# Patient Record
Sex: Male | Born: 1950 | Race: White | Hispanic: No | Smoking: Never smoker
Health system: Southern US, Community
[De-identification: ages and names within clinical notes are randomized; demographics above are authoritative.]

## PROBLEM LIST (undated history)

## (undated) DIAGNOSIS — K2970 Gastritis, unspecified, without bleeding: Secondary | ICD-10-CM

## (undated) DIAGNOSIS — G5790 Unspecified mononeuropathy of unspecified lower limb: Secondary | ICD-10-CM

## (undated) DIAGNOSIS — H409 Unspecified glaucoma: Secondary | ICD-10-CM

## (undated) DIAGNOSIS — G8929 Other chronic pain: Secondary | ICD-10-CM

## (undated) DIAGNOSIS — K259 Gastric ulcer, unspecified as acute or chronic, without hemorrhage or perforation: Secondary | ICD-10-CM

## (undated) DIAGNOSIS — M1990 Unspecified osteoarthritis, unspecified site: Secondary | ICD-10-CM

## (undated) DIAGNOSIS — K226 Gastro-esophageal laceration-hemorrhage syndrome: Secondary | ICD-10-CM

## (undated) DIAGNOSIS — M519 Unspecified thoracic, thoracolumbar and lumbosacral intervertebral disc disorder: Secondary | ICD-10-CM

## (undated) DIAGNOSIS — M549 Dorsalgia, unspecified: Secondary | ICD-10-CM

## (undated) DIAGNOSIS — R569 Unspecified convulsions: Secondary | ICD-10-CM

## (undated) DIAGNOSIS — K922 Gastrointestinal hemorrhage, unspecified: Secondary | ICD-10-CM

## (undated) DIAGNOSIS — F1010 Alcohol abuse, uncomplicated: Secondary | ICD-10-CM

## (undated) DIAGNOSIS — D649 Anemia, unspecified: Secondary | ICD-10-CM

## (undated) HISTORY — PX: TONSILLECTOMY: SUR1361

---

## 2005-12-26 DIAGNOSIS — G5790 Unspecified mononeuropathy of unspecified lower limb: Secondary | ICD-10-CM

## 2005-12-26 HISTORY — DX: Unspecified mononeuropathy of unspecified lower limb: G57.90

## 2009-08-26 ENCOUNTER — Ambulatory Visit: Payer: Self-pay | Admitting: Internal Medicine

## 2009-09-01 ENCOUNTER — Ambulatory Visit: Payer: Self-pay | Admitting: *Deleted

## 2010-02-19 ENCOUNTER — Ambulatory Visit: Payer: Self-pay | Admitting: Internal Medicine

## 2010-02-22 ENCOUNTER — Ambulatory Visit (HOSPITAL_COMMUNITY): Admission: RE | Admit: 2010-02-22 | Discharge: 2010-02-22 | Payer: Self-pay | Admitting: Internal Medicine

## 2010-02-24 ENCOUNTER — Ambulatory Visit: Payer: Self-pay | Admitting: Internal Medicine

## 2010-02-24 LAB — CONVERTED CEMR LAB
Albumin: 4.1 g/dL (ref 3.5–5.2)
Alkaline Phosphatase: 78 units/L (ref 39–117)
Basophils Absolute: 0 10*3/uL (ref 0.0–0.1)
CO2: 23 meq/L (ref 19–32)
Calcium: 9.4 mg/dL (ref 8.4–10.5)
Chloride: 105 meq/L (ref 96–112)
Cortisol, Plasma: 6.7 ug/dL
Creatinine, Ser: 0.64 mg/dL (ref 0.40–1.50)
DHEA-SO4: 34 ug/dL — ABNORMAL LOW (ref 80–560)
Eosinophils Absolute: 0.1 10*3/uL (ref 0.0–0.7)
Eosinophils Relative: 1 % (ref 0–5)
HCT: 41 % (ref 39.0–52.0)
Hgb A1c MFr Bld: 6.5 % — ABNORMAL HIGH (ref 4.6–6.1)
MCHC: 32.9 g/dL (ref 30.0–36.0)
MCV: 91.5 fL (ref 78.0–100.0)
Neutro Abs: 3.8 10*3/uL (ref 1.7–7.7)
Platelets: 163 10*3/uL (ref 150–400)
Potassium: 3.9 meq/L (ref 3.5–5.3)
RBC: 4.48 M/uL (ref 4.22–5.81)
Sodium: 139 meq/L (ref 135–145)
Total Bilirubin: 1.1 mg/dL (ref 0.3–1.2)
Vit D, 25-Hydroxy: 38 ng/mL (ref 30–89)

## 2010-02-26 ENCOUNTER — Ambulatory Visit (HOSPITAL_COMMUNITY): Admission: RE | Admit: 2010-02-26 | Discharge: 2010-02-26 | Payer: Self-pay | Admitting: Internal Medicine

## 2010-02-28 ENCOUNTER — Ambulatory Visit (HOSPITAL_COMMUNITY): Admission: RE | Admit: 2010-02-28 | Discharge: 2010-02-28 | Payer: Self-pay | Admitting: Internal Medicine

## 2010-03-10 ENCOUNTER — Ambulatory Visit: Payer: Self-pay | Admitting: Internal Medicine

## 2010-03-23 ENCOUNTER — Ambulatory Visit: Payer: Self-pay | Admitting: Internal Medicine

## 2010-03-23 LAB — CONVERTED CEMR LAB
Testosterone: 144.01 ng/dL — ABNORMAL LOW (ref 350–890)
Total CHOL/HDL Ratio: 4.4
VLDL: 19 mg/dL (ref 0–40)

## 2010-04-08 ENCOUNTER — Ambulatory Visit: Payer: Self-pay | Admitting: Internal Medicine

## 2010-04-29 ENCOUNTER — Ambulatory Visit: Payer: Self-pay | Admitting: Internal Medicine

## 2010-04-29 LAB — CONVERTED CEMR LAB: PSA: 0.06 ng/mL — ABNORMAL LOW (ref 0.10–4.00)

## 2010-05-07 ENCOUNTER — Ambulatory Visit: Payer: Self-pay | Admitting: Internal Medicine

## 2010-05-13 ENCOUNTER — Ambulatory Visit: Payer: Self-pay | Admitting: Internal Medicine

## 2010-05-13 LAB — CONVERTED CEMR LAB: Folate: 14.5 ng/mL

## 2010-05-31 ENCOUNTER — Ambulatory Visit: Payer: Self-pay | Admitting: Internal Medicine

## 2010-06-01 ENCOUNTER — Encounter (INDEPENDENT_AMBULATORY_CARE_PROVIDER_SITE_OTHER): Payer: Self-pay | Admitting: Internal Medicine

## 2010-06-14 ENCOUNTER — Ambulatory Visit: Payer: Self-pay | Admitting: Internal Medicine

## 2010-07-07 ENCOUNTER — Ambulatory Visit: Payer: Self-pay | Admitting: Internal Medicine

## 2010-07-07 LAB — CONVERTED CEMR LAB
BUN: 16 mg/dL (ref 6–23)
CO2: 19 meq/L (ref 19–32)
Chloride: 107 meq/L (ref 96–112)
Creatinine, Ser: 0.6 mg/dL (ref 0.40–1.50)
Magnesium: 2 mg/dL (ref 1.5–2.5)

## 2010-07-16 ENCOUNTER — Ambulatory Visit: Payer: Self-pay | Admitting: Internal Medicine

## 2010-08-05 ENCOUNTER — Ambulatory Visit: Payer: Self-pay | Admitting: Internal Medicine

## 2010-08-26 ENCOUNTER — Encounter: Admission: RE | Admit: 2010-08-26 | Discharge: 2010-10-27 | Payer: Self-pay | Admitting: Specialist

## 2010-09-28 ENCOUNTER — Encounter (INDEPENDENT_AMBULATORY_CARE_PROVIDER_SITE_OTHER): Payer: Self-pay | Admitting: *Deleted

## 2010-09-28 LAB — CONVERTED CEMR LAB: Hgb A1c MFr Bld: 6.6 % — ABNORMAL HIGH (ref <5.7)

## 2010-10-22 ENCOUNTER — Encounter (INDEPENDENT_AMBULATORY_CARE_PROVIDER_SITE_OTHER): Payer: Self-pay | Admitting: *Deleted

## 2010-10-22 LAB — CONVERTED CEMR LAB: Testosterone: 161.61 ng/dL — ABNORMAL LOW (ref 250–890)

## 2011-02-22 ENCOUNTER — Encounter (INDEPENDENT_AMBULATORY_CARE_PROVIDER_SITE_OTHER): Payer: Self-pay | Admitting: *Deleted

## 2011-02-22 LAB — CONVERTED CEMR LAB
ALT: 32 units/L (ref 0–53)
AST: 35 units/L (ref 0–37)
Calcium: 9.4 mg/dL (ref 8.4–10.5)
Chloride: 106 meq/L (ref 96–112)
Creatinine, Ser: 0.53 mg/dL (ref 0.40–1.50)
LDL Cholesterol: 114 mg/dL — ABNORMAL HIGH (ref 0–99)
Potassium: 3.6 meq/L (ref 3.5–5.3)
Testosterone: 132.07 ng/dL — ABNORMAL LOW (ref 250–890)
Total Protein: 7.7 g/dL (ref 6.0–8.3)
Triglycerides: 70 mg/dL (ref <150)

## 2011-03-02 ENCOUNTER — Ambulatory Visit: Payer: Self-pay | Attending: Family Medicine | Admitting: Physical Therapy

## 2011-03-02 DIAGNOSIS — IMO0001 Reserved for inherently not codable concepts without codable children: Secondary | ICD-10-CM | POA: Insufficient documentation

## 2011-03-02 DIAGNOSIS — R269 Unspecified abnormalities of gait and mobility: Secondary | ICD-10-CM | POA: Insufficient documentation

## 2011-03-07 ENCOUNTER — Ambulatory Visit: Payer: Self-pay | Admitting: Physical Therapy

## 2011-03-09 ENCOUNTER — Ambulatory Visit: Payer: Self-pay | Admitting: Physical Therapy

## 2011-03-15 ENCOUNTER — Ambulatory Visit: Payer: Self-pay | Admitting: Physical Therapy

## 2011-03-18 ENCOUNTER — Ambulatory Visit: Payer: Self-pay | Admitting: Physical Therapy

## 2011-03-21 ENCOUNTER — Ambulatory Visit: Payer: Self-pay | Admitting: Physical Therapy

## 2011-03-23 ENCOUNTER — Ambulatory Visit: Payer: Self-pay | Admitting: Physical Therapy

## 2011-03-28 ENCOUNTER — Ambulatory Visit: Payer: Self-pay | Attending: Physical Therapy | Admitting: Physical Therapy

## 2011-03-28 DIAGNOSIS — IMO0001 Reserved for inherently not codable concepts without codable children: Secondary | ICD-10-CM | POA: Insufficient documentation

## 2011-03-28 DIAGNOSIS — R269 Unspecified abnormalities of gait and mobility: Secondary | ICD-10-CM | POA: Insufficient documentation

## 2011-03-30 ENCOUNTER — Ambulatory Visit: Payer: Self-pay | Admitting: Physical Therapy

## 2011-08-31 ENCOUNTER — Inpatient Hospital Stay (INDEPENDENT_AMBULATORY_CARE_PROVIDER_SITE_OTHER)
Admission: RE | Admit: 2011-08-31 | Discharge: 2011-08-31 | Disposition: A | Discharge status: Home / Self Care | Payer: Self-pay | Source: Ambulatory Visit | Attending: Family Medicine | Admitting: Family Medicine

## 2011-08-31 DIAGNOSIS — L255 Unspecified contact dermatitis due to plants, except food: Secondary | ICD-10-CM

## 2011-10-07 ENCOUNTER — Emergency Department (HOSPITAL_COMMUNITY): Payer: Medicare Other

## 2011-10-07 ENCOUNTER — Emergency Department (HOSPITAL_COMMUNITY)
Admission: EM | Admit: 2011-10-07 | Discharge: 2011-10-07 | Disposition: A | Discharge status: Home / Self Care | Payer: Medicare Other | Attending: Emergency Medicine | Admitting: Emergency Medicine

## 2011-10-07 DIAGNOSIS — S0100XA Unspecified open wound of scalp, initial encounter: Secondary | ICD-10-CM | POA: Insufficient documentation

## 2011-10-07 DIAGNOSIS — G589 Mononeuropathy, unspecified: Secondary | ICD-10-CM | POA: Insufficient documentation

## 2011-10-07 DIAGNOSIS — Z79899 Other long term (current) drug therapy: Secondary | ICD-10-CM | POA: Insufficient documentation

## 2011-10-07 DIAGNOSIS — R279 Unspecified lack of coordination: Secondary | ICD-10-CM | POA: Insufficient documentation

## 2011-10-07 DIAGNOSIS — Y9301 Activity, walking, marching and hiking: Secondary | ICD-10-CM | POA: Insufficient documentation

## 2011-10-07 DIAGNOSIS — W010XXA Fall on same level from slipping, tripping and stumbling without subsequent striking against object, initial encounter: Secondary | ICD-10-CM | POA: Insufficient documentation

## 2011-10-07 DIAGNOSIS — Y998 Other external cause status: Secondary | ICD-10-CM | POA: Insufficient documentation

## 2011-10-16 ENCOUNTER — Inpatient Hospital Stay (INDEPENDENT_AMBULATORY_CARE_PROVIDER_SITE_OTHER)
Admission: RE | Admit: 2011-10-16 | Discharge: 2011-10-16 | Disposition: A | Discharge status: Home / Self Care | Payer: Medicare Other | Source: Ambulatory Visit | Attending: Family Medicine | Admitting: Family Medicine

## 2011-10-16 DIAGNOSIS — Z4802 Encounter for removal of sutures: Secondary | ICD-10-CM

## 2011-10-16 DIAGNOSIS — R42 Dizziness and giddiness: Secondary | ICD-10-CM

## 2011-12-27 DIAGNOSIS — K226 Gastro-esophageal laceration-hemorrhage syndrome: Secondary | ICD-10-CM

## 2011-12-27 HISTORY — DX: Gastro-esophageal laceration-hemorrhage syndrome: K22.6

## 2011-12-29 ENCOUNTER — Encounter (HOSPITAL_COMMUNITY)
Admission: EM | Disposition: A | Discharge status: Home / Self Care | Payer: Self-pay | Source: Home / Self Care | Attending: Emergency Medicine

## 2011-12-29 ENCOUNTER — Emergency Department (HOSPITAL_COMMUNITY)
Admission: EM | Admit: 2011-12-29 | Discharge: 2011-12-29 | Disposition: A | Discharge status: Home / Self Care | Payer: Medicare Other | Attending: Internal Medicine | Admitting: Internal Medicine

## 2011-12-29 ENCOUNTER — Other Ambulatory Visit: Payer: Self-pay | Admitting: Internal Medicine

## 2011-12-29 ENCOUNTER — Encounter: Payer: Self-pay | Admitting: Emergency Medicine

## 2011-12-29 DIAGNOSIS — K226 Gastro-esophageal laceration-hemorrhage syndrome: Secondary | ICD-10-CM | POA: Insufficient documentation

## 2011-12-29 DIAGNOSIS — K921 Melena: Secondary | ICD-10-CM | POA: Insufficient documentation

## 2011-12-29 DIAGNOSIS — K922 Gastrointestinal hemorrhage, unspecified: Secondary | ICD-10-CM

## 2011-12-29 DIAGNOSIS — G629 Polyneuropathy, unspecified: Secondary | ICD-10-CM | POA: Diagnosis present

## 2011-12-29 DIAGNOSIS — K259 Gastric ulcer, unspecified as acute or chronic, without hemorrhage or perforation: Secondary | ICD-10-CM | POA: Insufficient documentation

## 2011-12-29 DIAGNOSIS — K2970 Gastritis, unspecified, without bleeding: Secondary | ICD-10-CM | POA: Insufficient documentation

## 2011-12-29 DIAGNOSIS — M1990 Unspecified osteoarthritis, unspecified site: Secondary | ICD-10-CM | POA: Diagnosis present

## 2011-12-29 DIAGNOSIS — R1013 Epigastric pain: Secondary | ICD-10-CM | POA: Insufficient documentation

## 2011-12-29 DIAGNOSIS — G589 Mononeuropathy, unspecified: Secondary | ICD-10-CM | POA: Insufficient documentation

## 2011-12-29 DIAGNOSIS — K920 Hematemesis: Secondary | ICD-10-CM | POA: Insufficient documentation

## 2011-12-29 DIAGNOSIS — Q391 Atresia of esophagus with tracheo-esophageal fistula: Secondary | ICD-10-CM | POA: Insufficient documentation

## 2011-12-29 DIAGNOSIS — M129 Arthropathy, unspecified: Secondary | ICD-10-CM | POA: Insufficient documentation

## 2011-12-29 DIAGNOSIS — F1010 Alcohol abuse, uncomplicated: Secondary | ICD-10-CM | POA: Diagnosis present

## 2011-12-29 DIAGNOSIS — K2990 Gastroduodenitis, unspecified, without bleeding: Secondary | ICD-10-CM | POA: Insufficient documentation

## 2011-12-29 DIAGNOSIS — F1020 Alcohol dependence, uncomplicated: Secondary | ICD-10-CM | POA: Insufficient documentation

## 2011-12-29 DIAGNOSIS — D62 Acute posthemorrhagic anemia: Secondary | ICD-10-CM | POA: Insufficient documentation

## 2011-12-29 HISTORY — DX: Unspecified osteoarthritis, unspecified site: M19.90

## 2011-12-29 HISTORY — DX: Unspecified mononeuropathy of unspecified lower limb: G57.90

## 2011-12-29 HISTORY — DX: Unspecified thoracic, thoracolumbar and lumbosacral intervertebral disc disorder: M51.9

## 2011-12-29 HISTORY — DX: Anemia, unspecified: D64.9

## 2011-12-29 HISTORY — DX: Unspecified glaucoma: H40.9

## 2011-12-29 HISTORY — PX: ESOPHAGOGASTRODUODENOSCOPY: SHX5428

## 2011-12-29 LAB — PROTIME-INR: INR: 1.06 (ref 0.00–1.49)

## 2011-12-29 LAB — COMPREHENSIVE METABOLIC PANEL
Albumin: 3.6 g/dL (ref 3.5–5.2)
BUN: 14 mg/dL (ref 6–23)
CO2: 24 mEq/L (ref 19–32)
Chloride: 102 mEq/L (ref 96–112)
Creatinine, Ser: 0.47 mg/dL — ABNORMAL LOW (ref 0.50–1.35)
GFR calc Af Amer: >90 mL/min (ref >90)
GFR calc non Af Amer: >90 mL/min (ref >90)
Potassium: 4 mEq/L (ref 3.5–5.1)
Sodium: 141 mEq/L (ref 135–145)

## 2011-12-29 LAB — CBC
HCT: 34 % — ABNORMAL LOW (ref 39.0–52.0)
MCHC: 34.1 g/dL (ref 30.0–36.0)
WBC: 4.8 10*3/uL (ref 4.0–10.5)

## 2011-12-29 LAB — TYPE AND SCREEN
ABO/RH(D): O NEG
Antibody Screen: NEGATIVE

## 2011-12-29 LAB — ABO/RH: ABO/RH(D): O NEG

## 2011-12-29 LAB — OCCULT BLOOD, POC DEVICE: Fecal Occult Bld: POSITIVE

## 2011-12-29 SURGERY — EGD (ESOPHAGOGASTRODUODENOSCOPY)
Anesthesia: Moderate Sedation

## 2011-12-29 MED ORDER — LORAZEPAM 2 MG/ML IJ SOLN: 1.0000 mg | Freq: Four times a day (QID) | INTRAMUSCULAR | Status: DC | PRN

## 2011-12-29 MED ORDER — SODIUM CHLORIDE 0.9 % IV SOLN
INTRAVENOUS | Status: DC
  Administered 2011-12-29: 05:00:00 via INTRAVENOUS

## 2011-12-29 MED ORDER — LORAZEPAM 1 MG PO TABS: 1.0000 mg | ORAL_TABLET | Freq: Four times a day (QID) | ORAL | Status: DC | PRN

## 2011-12-29 MED ORDER — SODIUM CHLORIDE 0.45 % IV SOLN: Freq: Once | INTRAVENOUS | Status: DC

## 2011-12-29 MED ORDER — DIPHENHYDRAMINE HCL 50 MG/ML IJ SOLN
INTRAMUSCULAR | Status: AC
  Filled 2011-12-29: qty 1

## 2011-12-29 MED ORDER — FOLIC ACID 5 MG/ML IJ SOLN: 1.0000 mg | Freq: Every day | INTRAMUSCULAR | Status: DC

## 2011-12-29 MED ORDER — ONDANSETRON HCL 4 MG/2ML IJ SOLN: 4.0000 mg | Freq: Four times a day (QID) | INTRAMUSCULAR | Status: DC | PRN

## 2011-12-29 MED ORDER — FOLIC ACID 1 MG PO TABS: 1.0000 mg | ORAL_TABLET | Freq: Every day | ORAL | Status: DC

## 2011-12-29 MED ORDER — VITAMIN B-1 100 MG PO TABS: 100.0000 mg | ORAL_TABLET | Freq: Every day | ORAL | Status: DC

## 2011-12-29 MED ORDER — ZOLPIDEM TARTRATE 5 MG PO TABS: 5.0000 mg | ORAL_TABLET | Freq: Every evening | ORAL | Status: DC | PRN

## 2011-12-29 MED ORDER — SODIUM CHLORIDE 0.9 % IV SOLN: INTRAVENOUS | Status: DC

## 2011-12-29 MED ORDER — DIPHENHYDRAMINE HCL 50 MG/ML IJ SOLN
INTRAMUSCULAR | Status: DC | PRN
  Administered 2011-12-29: 25 mg via INTRAVENOUS

## 2011-12-29 MED ORDER — PANTOPRAZOLE SODIUM 40 MG IV SOLR: 40.0000 mg | Freq: Two times a day (BID) | INTRAVENOUS | Status: DC

## 2011-12-29 MED ORDER — MIDAZOLAM HCL 10 MG/2ML IJ SOLN
INTRAMUSCULAR | Status: DC | PRN
  Administered 2011-12-29: 1 mg via INTRAVENOUS
  Administered 2011-12-29 (×2): 2 mg via INTRAVENOUS

## 2011-12-29 MED ORDER — ADULT MULTIVITAMIN W/MINERALS CH: 1.0000 | ORAL_TABLET | Freq: Every day | ORAL | Status: DC

## 2011-12-29 MED ORDER — MIDAZOLAM HCL 10 MG/2ML IJ SOLN
INTRAMUSCULAR | Status: AC
  Filled 2011-12-29: qty 2

## 2011-12-29 MED ORDER — SENNA 8.6 MG PO TABS: 1.0000 | ORAL_TABLET | Freq: Two times a day (BID) | ORAL | Status: DC

## 2011-12-29 MED ORDER — FENTANYL CITRATE 0.05 MG/ML IJ SOLN
INTRAMUSCULAR | Status: AC
  Filled 2011-12-29: qty 2

## 2011-12-29 MED ORDER — TRAMADOL HCL 50 MG PO TABS: 50.0000 mg | ORAL_TABLET | Freq: Three times a day (TID) | ORAL | Status: DC | PRN

## 2011-12-29 MED ORDER — GABAPENTIN 400 MG PO CAPS: 400.0000 mg | ORAL_CAPSULE | Freq: Three times a day (TID) | ORAL | Status: DC

## 2011-12-29 MED ORDER — THIAMINE HCL 100 MG/ML IJ SOLN: 100.0000 mg | Freq: Every day | INTRAMUSCULAR | Status: DC

## 2011-12-29 MED ORDER — PANTOPRAZOLE SODIUM 40 MG IV SOLR
40.0000 mg | Freq: Once | INTRAVENOUS | Status: AC
  Administered 2011-12-29: 40 mg via INTRAVENOUS
  Filled 2011-12-29: qty 40

## 2011-12-29 MED ORDER — BUTAMBEN-TETRACAINE-BENZOCAINE 2-2-14 % EX AERO
INHALATION_SPRAY | CUTANEOUS | Status: DC | PRN
  Administered 2011-12-29: 2 via TOPICAL

## 2011-12-29 MED ORDER — FENTANYL NICU IV SYRINGE 50 MCG/ML
INJECTION | INTRAMUSCULAR | Status: DC | PRN
  Administered 2011-12-29 (×3): 25 ug via INTRAVENOUS

## 2011-12-29 MED ORDER — MORPHINE SULFATE 2 MG/ML IJ SOLN: 1.0000 mg | INTRAMUSCULAR | Status: DC | PRN

## 2011-12-29 MED ORDER — ONDANSETRON HCL 4 MG PO TABS: 4.0000 mg | ORAL_TABLET | Freq: Four times a day (QID) | ORAL | Status: DC | PRN

## 2011-12-29 NOTE — ED Provider Notes (Signed)
History     CSN: [id number]  Arrival date & time 12/29/11  0212   First MD Initiated Contact with Patient 12/29/11 0444      Chief Complaint  Patient presents with  . Hematemesis  . AICD Problem  . Alcohol Problem    (Consider location/radiation/quality/duration/timing/severity/associated sxs/prior treatment) HPI Complains of vomiting blood and black stools onset yesterday area denies lightheadedness denies chest pain denies abdominal pain denies other complaint admits to drinking alcohol last time yesterday. No treatment prior to coming here nothing makes symptoms better or worse Past Medical History  Diagnosis Date  . Lumbar disc disorder    alcoholic. No history of AICD  History reviewed. No pertinent past surgical history.  History reviewed. No pertinent family history.  History  Substance Use Topics  . Smoking status: Not on file  . Smokeless tobacco: Not on file  . Alcohol Use: Yes     heavy drinker   Nonsmoker no drug use   Review of Systems  Constitutional: Negative.   HENT: Negative.   Respiratory: Negative.   Cardiovascular: Negative.   Gastrointestinal: Positive for blood in stool.       Hematemesis  Musculoskeletal: Negative.   Skin: Negative.   Neurological: Negative.   Hematological: Negative.   Psychiatric/Behavioral: Negative.   All other systems reviewed and are negative.    Allergies  Review of patient's allergies indicates no known allergies.  Home Medications   Current Outpatient Rx  Name Route Sig Dispense Refill  . GABAPENTIN 400 MG PO CAPS Oral Take 400 mg by mouth 3 (three) times daily.      . IBUPROFEN 800 MG PO TABS Oral Take 800 mg by mouth every 8 (eight) hours as needed. Pain from neuropathy     . TRAMADOL HCL 50 MG PO TABS Oral Take 50 mg by mouth every 8 (eight) hours as needed. pain       BP 143/76  Pulse 104  Temp(Src) 98.4 F (36.9 C) (Oral)  Resp 19  SpO2 95%  Physical Exam  Constitutional: He appears  well-developed and well-nourished.  HENT:  Head: Normocephalic and atraumatic.  Eyes: Conjunctivae are normal. Pupils are equal, round, and reactive to light. Scleral icterus is present.       Sclera icteric  Neck: Neck supple. No tracheal deviation present. No thyromegaly present.  Cardiovascular: Normal rate and regular rhythm.   No murmur heard. Pulmonary/Chest: Effort normal and breath sounds normal.  Abdominal: Soft. Bowel sounds are normal. He exhibits no distension. There is no tenderness.  Genitourinary: Penis normal. Guaiac positive stool.       Black stool grossly melena  Musculoskeletal: Normal range of motion. He exhibits no edema and no tenderness.  Neurological: He is alert. Coordination normal.  Skin: Skin is warm and dry. No rash noted.  Psychiatric: He has a normal mood and affect.    ED Course  Procedures (including critical care time)  Labs Reviewed - No data to display No results found. 7:25 AM patient resting comfortably Spoke withGhimire will arrange for admission  No diagnosis found.    MDM  Plan admit telemetry Protonix, Diagnosis GI bleed        Doug Sou, MD 12/29/11 [phone]

## 2011-12-29 NOTE — Op Note (Signed)
[facility] [address] [place], [place]  [address]  ENDOSCOPY PROCEDURE REPORT  PATIENT:  Marcques, Wrightsman  MR#:  [id number] BIRTHDATE:  [birth year], 60 yrs. old  GENDER:  male ENDOSCOPIST:  Carie Caddy. Vallerie Hentz, MD Referred by:  [facility], PROCEDURE DATE:  12/29/2011 PROCEDURE:  EGD with biopsy, 43239 ASA CLASS:  Class II INDICATIONS:  melena, hematemesis, epigastric pain MEDICATIONS:   Benadryl 25 mg IV, Fentanyl 75 mcg IV, Versed 5 mg IV TOPICAL ANESTHETIC:  Cetacaine Spray  DESCRIPTION OF PROCEDURE:   After the risks benefits and alternatives of the procedure were thoroughly explained, informed consent was obtained.  The Pentax Gastroscope [id number] endoscope was introduced through the mouth and advanced to the second portion of the duodenum, without limitations.  The instrument was slowly withdrawn as the mucosa was fully examined. <<PROCEDUREIMAGES>> An partial,non-obstructing esophageal ring was found at the gastroesophageal junction.  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 Moderate gastritis was found in the body and the antrum of the stomach. Multiple biopsies were obtained and sent to pathology. An clean-based ulcer was found in the antrum. Multiple biopsies were obtained from the ulcer and sent to pathology.  The duodenal bulb was normal in appearance, as was the postbulbar duodenum. Retroflexed views revealed no abnormalities.    The scope was then withdrawn from the patient and the procedure completed.  COMPLICATIONS:  None ENDOSCOPIC IMPRESSION: 1) Partial, non-obstructing ring at the gastroesophageal junction 2) Mallory-Weiss tear at the gastroesophageal junction  No active bleeding. 3) Moderate gastritis in the body and the antrum of the stomach. Biopsies done to rule out H. Pylori 4) Ulcer in the antrum with clean-base. Likely benign, but biopsies performed to rule out dysplasia. 5)  Normal duodenum  RECOMMENDATIONS: 1) Await pathology results 2) Avoid NSAIDS 3) Follow-up of helicobacter pylori status, treat if indicated 4) PO BID PPI (Acid suppression medication) for at least 2 months. 5) Limit alcohol intake. 6) Consider repeat EGD in 12 weeks to ensure healing. 7) The ulcer is likely the cause of intermittent melena at home and the Mallory-Weiss tear felt responsible for recent hematemesis.  Ulcer is low-risk for immediate rebleeding, and thus patient can be discharged with BID PPI from GI standpoint.  Carie Caddy. Sayler Mickiewicz, MD  CC:  The Patient  n. eSIGNED:   Carie Caddy. Rutha Melgoza at 12/29/2011 12:25 PM  Suzzanne Cloud, [phone]

## 2011-12-29 NOTE — H&P (Signed)
PATIENT DETAILS Name: Joshua Waller Age: 61 y.o. Sex: male Date of Birth: 1951-02-23 Admit Date: 12/29/2011 XBM:[id number],MWNUUV, MD, MD   CHIEF COMPLAINT:  'I vomited blood"  HPI: Patient is a 61 year old Caucasian male with a past medical history of long-standing EtOH use,Neuropathy, degenerative disc disease who takes 800 mg of ibuprofen twice or thrice a day on a scheduled basis comes in with the above noted complaints. Patient claims that starting yesterday he had 2 episodes of vomiting that were bloody. He claims that for the past 1 or 2 weeks he has had intermittent melanotic stools as well. He also complains of mild epigastric pain. Patient drinks about a sixpack of beer on a daily basis. Because of these symptoms he decided to come to the ED where he was found to have black stools on rectal exam. The hospitalist service and was asked to admit this patient for further evaluation and treatment.   ALLERGIES:  No Known Allergies  PAST MEDICAL HISTORY: Past Medical History  Diagnosis Date  . Lumbar disc disorder     PAST SURGICAL HISTORY: History reviewed. No pertinent past surgical history.  MEDICATIONS AT HOME: Prior to Admission medications   Medication Sig Start Date End Date Taking? Authorizing Provider  gabapentin (NEURONTIN) 400 MG capsule Take 400 mg by mouth 3 (three) times daily.     Yes Historical Provider, MD  ibuprofen (ADVIL,MOTRIN) 800 MG tablet Take 800 mg by mouth every 8 (eight) hours as needed. Pain from neuropathy    Yes Historical Provider, MD  traMADol (ULTRAM) 50 MG tablet Take 50 mg by mouth every 8 (eight) hours as needed. pain    Yes Historical Provider, MD    FAMILY HISTORY: History reviewed. No pertinent family history.  SOCIAL HISTORY:  does not have a smoking history on file. He does not have any smokeless tobacco history on file. He reports that he drinks alcohol. He reports that he does not use illicit drugs.he is homeless.  REVIEW OF SYSTEMS:    Constitutional:   No  weight loss, night sweats,  Fevers, chills, fatigue.  HEENT:    No headaches, Difficulty swallowing,Tooth/dental problems,Sore throat,  No sneezing, itching, ear ache, nasal congestion, post nasal drip,   Cardio-vascular: No chest pain,  Orthopnea, PND, swelling in lower extremities, anasarca, dizziness, palpitations  GI:  No heartburn, indigestion, abdominal pain, diarrhea, change in  bowel habits, loss of appetite  Resp: No shortness of breath with exertion or at rest.  No excess mucus, no productive cough, No non-productive cough,  No coughing up of blood.No change in color of mucus.No wheezing.No chest wall deformity  Skin:  no rash or lesions.  GU:  no dysuria, change in color of urine, no urgency or frequency.  No flank pain.  Musculoskeletal: No joint pain or swelling.  No decreased range of motion.  No back pain.  Psych: No change in mood or affect. No depression or anxiety.  No memory loss.   PHYSICAL EXAM: Blood pressure 120/67, pulse 86, temperature 98.4 F (36.9 C), temperature source Oral, resp. rate 19, SpO2 93.00%.  General appearance :Awake, alert, not in any distress. Speech Clear. Not toxic Looking HEENT: Atraumatic and Normocephalic, pupils equally reactive to light and accomodation Neck: supple, no JVD. No cervical lymphadenopathy.  Chest:Good air entry bilaterally, no added sounds  CVS: S1 S2 regular, no murmurs.  Abdomen: Bowel sounds present, Non tender and not distended with no gaurding, rigidity or rebound. Extremities: B/L Lower Ext shows no edema, both legs  are warm to touch, with  dorsalis pedis pulses palpable. Neurology: Awake alert, and oriented X 3, CN II-XII intact, Non focal, Deep Tendon Reflex-2+ all over, plantar's downgoing B/L, sensory exam is grossly intact.  Skin:No Rash Wounds:N/A  LABS ON ADMISSION:   Basename 12/29/11 0457  NA 141  K 4.0  CL 102  CO2 24  GLUCOSE 106*  BUN 14  CREATININE 0.47*   CALCIUM 9.0  MG --  PHOS --    Basename 12/29/11 0457  AST 91*  ALT 47  ALKPHOS 86  BILITOT 0.5  PROT 8.3  ALBUMIN 3.6   No results found for this basename: LIPASE:2,AMYLASE:2 in the last 72 hours  Basename 12/29/11 0457  WBC 4.8  NEUTROABS --  HGB 11.6*  HCT 34.0*  MCV 82.1  PLT 98*   No results found for this basename: CKTOTAL:3,CKMB:3,CKMBINDEX:3,TROPONINI:3 in the last 72 hours No results found for this basename: DDIMER:2 in the last 72 hours No components found with this basename: POCBNP:3   RADIOLOGIC STUDIES ON ADMISSION: No results found.  ASSESSMENT AND PLAN: Present on Admission:  .GI bleed -This certainly could be a small Mallory-Weiss tear or a gastritis from NSAID use.does have a long-standing history of alcohol use however he is now clinical stigmata for cirrhosis.  -For now we will keep him n.p.o. and place him on a pump inhibitor.  -H&H will be done every 8 hours for the first 24 hours. [facility] consult gastroenterology.   .Anemia associated with acute blood loss -Patient's current hemoglobin is certainly less than his usual baseline of around 13. -We'll continue to monitor his hemoglobin and hematocrit, if he were to have further drop in his hemoglobin and continued to have evidence of bleeding then he will certainly need transfusion.  Marland KitchenETOH abuse -His last drink was yesterday, currently has no signs of withdrawal.  -We will place him on Ativan per CIWA protocol. -MVI thiamine and folate will also be prescribed.   .Neuropathy -We will continue him on his Neurontin.   .Arthritis -Will continue to use tramadol on a when necessary basis for pain.  Further plan will depend as patient's clinical course evolves and further radiologic and laboratory data become available. Patient will be monitored closely.   DVT Prophylaxis: Bilateral SCDs.  Code Status: Full code  Total time spent for admission equals 45 minutes.  Jeoffrey Massed 12/29/2011,  8:12 AM

## 2011-12-29 NOTE — ED Notes (Signed)
Pt reports nausea and bloody emesis as well as bloody diarrhea stools denies current pain

## 2011-12-29 NOTE — Consult Note (Signed)
[facility] Consultation  Referring Provider: [facility] Primary Care Physician:  Joshua Skeans, MD, MD Primary Gastroenterologist:   none Reason for Consultation:  GI Bleed  HPI: Joshua Waller is a 61 y.o. male with history of ETOH abuse but no known liver disease who present with a 6 week history of intermittent melena and acute hematemesis. Patient takes Ibuprofen on a daily basis for neuropathy. No abdominal pain. No history of PUD. No otherGI symptoms.No unusual weight loss.   Past Medical History  Diagnosis Date  . Lumbar disc disorder     History reviewed. No pertinent past surgical history.  Prior to Admission medications   Medication Sig Start Date End Date Taking? Authorizing Provider  gabapentin (NEURONTIN) 400 MG capsule Take 400 mg by mouth 3 (three) times daily.     Yes Historical Provider, MD  ibuprofen (ADVIL,MOTRIN) 800 MG tablet Take 800 mg by mouth every 8 (eight) hours as needed. Pain from neuropathy    Yes Historical Provider, MD  traMADol (ULTRAM) 50 MG tablet Take 50 mg by mouth every 8 (eight) hours as needed. pain    Yes Historical Provider, MD    Current Facility-Administered Medications  Medication Dose Route Frequency Provider Last Rate Last Dose  . 0.9 %  sodium chloride infusion   Intravenous Continuous Joshua Sou, MD 125 mL/hr at 12/29/11 0501    . 0.9 %  sodium chloride infusion   Intravenous STAT Joshua Sou, MD      . pantoprazole (PROTONIX) injection 40 mg  40 mg Intravenous Once Joshua Sou, MD   40 mg at 12/29/11 0859  . pantoprazole (PROTONIX) injection 40 mg  40 mg Intravenous Q12H Joshua Sou, MD       Current Outpatient Prescriptions  Medication Sig Dispense Refill  . gabapentin (NEURONTIN) 400 MG capsule Take 400 mg by mouth 3 (three) times daily.        Marland Kitchen ibuprofen (ADVIL,MOTRIN) 800 MG tablet Take 800 mg by mouth every 8 (eight) hours as needed. Pain from neuropathy       . traMADol (ULTRAM) 50 MG tablet  Take 50 mg by mouth every 8 (eight) hours as needed. pain         Allergies as of 12/29/2011  . (No Known Allergies)    History reviewed. No pertinent family history.  History   Social History  . Marital Status: Divorced    Spouse Name: N/A    Number of Children: N/A  . Years of Education: N/A   Occupational History  . Not on file.   Social History Main Topics  . Smoking status: Not on file  . Smokeless tobacco: Not on file  . Alcohol Use: Yes     heavy drinker  . Drug Use: No  . Sexually Active:    Other Topics Concern  . Not on file   Social History Narrative  . No narrative on file    Review of Systems: All systems reviewed and negative except where noted in HPI.  PHYSICAL EXAM: Vital signs in last 24 hours: Temp:  [98.4 F (36.9 C)] 98.4 F (36.9 C) (01/03 0217) Pulse Rate:  [86-104] 86  (01/03 0700) Resp:  [14-19] 19  (01/03 0700) BP: (120-143)/(67-79) 120/67 mmHg (01/03 0700) SpO2:  [93 %-98 %] 93 % (01/03 0700)   General:  Well-developed, white male in NAD Head:  Normocephalic and atraumatic. Eyes:   No icterus.   Conjunctiva pink. Ears:  Normal auditory acuity. Neck:  Supple; no masses felt Lungs:  Respirations even and unlabored. Lungs clear to auscultation bilaterlly.   No wheezes, crackles, or rhonchi.  Heart:  Regular rate and rhythm; no murmurs heard. Abdomen:  Soft, nondistended, nontender. Normal bowel sounds. No appreciable masses, questionable mild hepatomegaly.  Rectal:  Dark, heme positive liquid in vault.  Msk:  Symmetrical without gross deformities.  Extremities:  Without edema. Neurologic:  Alert and  oriented x4;  grossly normal neurologically. Skin:  Intact without significant lesions or rashes. No spider nevi Cervical Nodes:  No significant cervical adenopathy. Psych:  Alert and cooperative. Normal mood and affect.  LAB RESULTS:  Basename 12/29/11 0457  WBC 4.8  HGB 11.6*  HCT 34.0*  PLT 98*   BMET  Basename 12/29/11  0457  NA 141  K 4.0  CL 102  CO2 24  GLUCOSE 106*  BUN 14  CREATININE 0.47*  CALCIUM 9.0   LFT  Basename 12/29/11 0457  PROT 8.3  ALBUMIN 3.6  AST 91*  ALT 47  ALKPHOS 86  BILITOT 0.5  BILIDIR --  IBILI --   PT/INR  Basename 12/29/11 0457  LABPROT 14.0  INR 1.06   PREVIOUS ENDOSCOPIES: none  IMPRESSION / PLAN: 77. 61 year old white male subacute GI bleed in form of intermittent black stools for 6 weeks. Now with recent episodes of coffee ground emesis followed by bright red emesis. Hgb okay at 11.6. BUN normal. Patient hemodynamically stable. Appears to be a low volume UGIB at this point. Despite ETOH abuse, doubt variceal bleed based on volume. Suspect PUD or erosive disease. He may now have a Mallory-Weiss tear given bright red blood. For further evaluation will proceed with EGD today.The benefits, risks, and potential complications of EGD with possible biopsies were discussed with the patient and she agrees to proceed. Continue BID PPI. Further recommendations pending EGD. 2.  ETOH abuse. No stigmata of chronic liver disease on exam. INR normal. Platelet low but may be secondary to bone marrow suppression from ETOH. His transaminases are mildly abnormal in pattern typical for ETOH 3. Neuropathy, ?etiology, maybe ETOH.  Thanks   LOS: 0 days   Joshua Waller  12/29/2011, 9:52 AM

## 2011-12-29 NOTE — ED Notes (Addendum)
Report given to RN Beth

## 2011-12-29 NOTE — ED Notes (Addendum)
PT reports he is an alcoholic; last drink was 3 hours ago; pt reports he drinks an 18 pack and 2 24 oz beers a day. He reports drinking copious amounts over lifetime. Pt reports he had projectile vomit that was black to start with then pink after that. Pt reports stools have been black over past 2 days. PT reports he does not feel dizzy or lightheaded but does loose balance due to neuropathy in both feet.

## 2011-12-29 NOTE — Consult Note (Signed)
Patient seen and I agree with the above documentation, including the assessment and plan. Will proceed with EGD. Liver insult may be ETOH induced, unsure if he has advanced liver disease, mild thrombocytopenia but no other stigmata of portal HTN at present.

## 2011-12-30 ENCOUNTER — Telehealth: Payer: Self-pay | Admitting: *Deleted

## 2011-12-30 NOTE — Telephone Encounter (Signed)
Willette Cluster, NP called. Patient was discharged from ED. Need to call patient and tell him no NSIADS or alcohol. PPI BID . Schedule f/u appointment with Dr. Rhea Belton. Left a message for patient to call me at his cell number.

## 2011-12-30 NOTE — Progress Notes (Signed)
This morning, I cannot locate the patient, upon further inquires it looks like the patient was ? discharged from Endoscopy. I was never infomed nor did I give discharge orders. Will inquire more to find out what exactly happened.

## 2012-01-01 ENCOUNTER — Encounter: Payer: Self-pay | Admitting: Internal Medicine

## 2012-01-02 ENCOUNTER — Encounter (HOSPITAL_COMMUNITY): Payer: Self-pay | Admitting: Internal Medicine

## 2012-01-02 ENCOUNTER — Other Ambulatory Visit: Payer: Self-pay

## 2012-01-02 MED ORDER — OMEPRAZOLE 20 MG PO CPDR: 20.0000 mg | DELAYED_RELEASE_CAPSULE | Freq: Two times a day (BID) | ORAL | Status: DC

## 2012-01-03 NOTE — Telephone Encounter (Signed)
Left a message for patient to call me. 

## 2012-01-04 ENCOUNTER — Encounter: Payer: Self-pay | Admitting: *Deleted

## 2012-01-04 NOTE — Telephone Encounter (Signed)
Mailed patient a letter with recommendations by Willette Cluster, NP.

## 2012-02-13 ENCOUNTER — Telehealth: Payer: Self-pay | Admitting: *Deleted

## 2012-02-13 NOTE — Telephone Encounter (Signed)
Per EGD notes from 12/29/2011, pt needs f/u EGD around April, 2013. No appts in for March, 2013, will send a reminder.

## 2012-02-15 NOTE — Telephone Encounter (Signed)
Scheduled pt for PV on 03/26/12 at 4pm and for his repeat EGD on 04/05/12 and mailed the letter to him. Also, lmom for him to return our call.

## 2012-03-26 ENCOUNTER — Encounter: Payer: Medicare Other | Admitting: *Deleted

## 2012-04-05 ENCOUNTER — Encounter: Payer: Medicare Other | Admitting: Internal Medicine

## 2012-12-21 ENCOUNTER — Inpatient Hospital Stay (HOSPITAL_COMMUNITY): Payer: Medicare Other

## 2012-12-21 ENCOUNTER — Inpatient Hospital Stay (HOSPITAL_COMMUNITY)
Admission: EM | Admit: 2012-12-21 | Discharge: 2012-12-26 | DRG: 378 | Disposition: A | Discharge status: Home / Self Care | Payer: Medicare Other | Attending: Internal Medicine | Admitting: Internal Medicine

## 2012-12-21 ENCOUNTER — Emergency Department (HOSPITAL_COMMUNITY): Payer: Medicare Other

## 2012-12-21 ENCOUNTER — Encounter (HOSPITAL_COMMUNITY): Payer: Self-pay | Admitting: *Deleted

## 2012-12-21 DIAGNOSIS — F1010 Alcohol abuse, uncomplicated: Secondary | ICD-10-CM | POA: Diagnosis present

## 2012-12-21 DIAGNOSIS — F1910 Other psychoactive substance abuse, uncomplicated: Secondary | ICD-10-CM | POA: Diagnosis present

## 2012-12-21 DIAGNOSIS — E876 Hypokalemia: Secondary | ICD-10-CM | POA: Diagnosis present

## 2012-12-21 DIAGNOSIS — L03119 Cellulitis of unspecified part of limb: Secondary | ICD-10-CM | POA: Diagnosis present

## 2012-12-21 DIAGNOSIS — L02419 Cutaneous abscess of limb, unspecified: Secondary | ICD-10-CM | POA: Diagnosis present

## 2012-12-21 DIAGNOSIS — M1990 Unspecified osteoarthritis, unspecified site: Secondary | ICD-10-CM

## 2012-12-21 DIAGNOSIS — D61818 Other pancytopenia: Secondary | ICD-10-CM | POA: Diagnosis present

## 2012-12-21 DIAGNOSIS — G629 Polyneuropathy, unspecified: Secondary | ICD-10-CM | POA: Diagnosis present

## 2012-12-21 DIAGNOSIS — K922 Gastrointestinal hemorrhage, unspecified: Principal | ICD-10-CM | POA: Diagnosis present

## 2012-12-21 DIAGNOSIS — L0390 Cellulitis, unspecified: Secondary | ICD-10-CM

## 2012-12-21 DIAGNOSIS — D62 Acute posthemorrhagic anemia: Secondary | ICD-10-CM | POA: Diagnosis present

## 2012-12-21 DIAGNOSIS — G621 Alcoholic polyneuropathy: Secondary | ICD-10-CM | POA: Diagnosis present

## 2012-12-21 DIAGNOSIS — M7989 Other specified soft tissue disorders: Secondary | ICD-10-CM

## 2012-12-21 LAB — CBC WITH DIFFERENTIAL/PLATELET
Basophils Relative: 0 % (ref 0–1)
Eosinophils Absolute: 0 10*3/uL (ref 0.0–0.7)
Eosinophils Relative: 1 % (ref 0–5)
Lymphs Abs: 0.9 10*3/uL (ref 0.7–4.0)
MCH: 27.8 pg (ref 26.0–34.0)
MCHC: 32.7 g/dL (ref 30.0–36.0)
MCV: 84.8 fL (ref 78.0–100.0)
Monocytes Relative: 15 % — ABNORMAL HIGH (ref 3–12)
Platelets: 118 10*3/uL — ABNORMAL LOW (ref 150–400)
RBC: 1.98 MIL/uL — ABNORMAL LOW (ref 4.22–5.81)

## 2012-12-21 LAB — COMPREHENSIVE METABOLIC PANEL
BUN: 16 mg/dL (ref 6–23)
Calcium: 8.3 mg/dL — ABNORMAL LOW (ref 8.4–10.5)
GFR calc Af Amer: >90 mL/min (ref >90)
Glucose, Bld: 131 mg/dL — ABNORMAL HIGH (ref 70–99)
Sodium: 135 mEq/L (ref 135–145)
Total Protein: 5.9 g/dL — ABNORMAL LOW (ref 6.0–8.3)

## 2012-12-21 LAB — CBC
HCT: 14.4 % — ABNORMAL LOW (ref 39.0–52.0)
MCV: 85.2 fL (ref 78.0–100.0)
Platelets: 88 10*3/uL — ABNORMAL LOW (ref 150–400)
RBC: 1.69 MIL/uL — ABNORMAL LOW (ref 4.22–5.81)
WBC: 1.9 10*3/uL — ABNORMAL LOW (ref 4.0–10.5)

## 2012-12-21 LAB — MRSA PCR SCREENING: MRSA by PCR: NEGATIVE

## 2012-12-21 LAB — PRO B NATRIURETIC PEPTIDE: Pro B Natriuretic peptide (BNP): 206 pg/mL — ABNORMAL HIGH (ref 0–125)

## 2012-12-21 MED ORDER — PREGABALIN 25 MG PO CAPS
25.0000 mg | ORAL_CAPSULE | Freq: Two times a day (BID) | ORAL | Status: DC
  Administered 2012-12-22 – 2012-12-26 (×8): 25 mg via ORAL
  Filled 2012-12-21 (×8): qty 1

## 2012-12-21 MED ORDER — ACETAMINOPHEN 325 MG PO TABS: 650.0000 mg | ORAL_TABLET | Freq: Four times a day (QID) | ORAL | Status: DC | PRN

## 2012-12-21 MED ORDER — GABAPENTIN 600 MG PO TABS
600.0000 mg | ORAL_TABLET | Freq: Three times a day (TID) | ORAL | Status: DC
  Administered 2012-12-22 – 2012-12-26 (×13): 600 mg via ORAL
  Filled 2012-12-21 (×16): qty 1

## 2012-12-21 MED ORDER — POTASSIUM CHLORIDE CRYS ER 20 MEQ PO TBCR
40.0000 meq | EXTENDED_RELEASE_TABLET | Freq: Once | ORAL | Status: AC
  Administered 2012-12-21: 40 meq via ORAL
  Filled 2012-12-21: qty 2

## 2012-12-21 MED ORDER — SODIUM CHLORIDE 0.9 % IV BOLUS (SEPSIS): 1000.0000 mL | Freq: Once | INTRAVENOUS | Status: DC

## 2012-12-21 MED ORDER — FOLIC ACID 1 MG PO TABS
1.0000 mg | ORAL_TABLET | Freq: Every day | ORAL | Status: DC
  Administered 2012-12-22 – 2012-12-26 (×4): 1 mg via ORAL
  Filled 2012-12-21 (×6): qty 1

## 2012-12-21 MED ORDER — SODIUM CHLORIDE 0.9 % IV SOLN
8.0000 mg/h | INTRAVENOUS | Status: DC
  Administered 2012-12-21: 8 mg/h via INTRAVENOUS
  Filled 2012-12-21 (×3): qty 80

## 2012-12-21 MED ORDER — LORAZEPAM 2 MG/ML IJ SOLN: 1.0000 mg | Freq: Four times a day (QID) | INTRAMUSCULAR | Status: DC | PRN

## 2012-12-21 MED ORDER — ADULT MULTIVITAMIN W/MINERALS CH
1.0000 | ORAL_TABLET | Freq: Every day | ORAL | Status: DC
  Administered 2012-12-22 – 2012-12-26 (×4): 1 via ORAL
  Filled 2012-12-21 (×6): qty 1

## 2012-12-21 MED ORDER — CLINDAMYCIN PHOSPHATE 600 MG/50ML IV SOLN
600.0000 mg | Freq: Three times a day (TID) | INTRAVENOUS | Status: AC
  Administered 2012-12-21 – 2012-12-23 (×7): 600 mg via INTRAVENOUS
  Filled 2012-12-21 (×8): qty 50

## 2012-12-21 MED ORDER — LORAZEPAM 1 MG PO TABS: 1.0000 mg | ORAL_TABLET | Freq: Four times a day (QID) | ORAL | Status: DC | PRN

## 2012-12-21 MED ORDER — SODIUM CHLORIDE 0.9 % IV SOLN
INTRAVENOUS | Status: DC
  Administered 2012-12-21: 21:00:00 via INTRAVENOUS
  Administered 2012-12-22: 1000 mL via INTRAVENOUS
  Administered 2012-12-24: 13:00:00 via INTRAVENOUS

## 2012-12-21 MED ORDER — CLINDAMYCIN PHOSPHATE 900 MG/50ML IV SOLN
900.0000 mg | Freq: Once | INTRAVENOUS | Status: AC
  Administered 2012-12-21: 900 mg via INTRAVENOUS
  Filled 2012-12-21: qty 50

## 2012-12-21 MED ORDER — THIAMINE HCL 100 MG/ML IJ SOLN
100.0000 mg | Freq: Every day | INTRAMUSCULAR | Status: DC
  Administered 2012-12-21 – 2012-12-22 (×2): 100 mg via INTRAVENOUS
  Filled 2012-12-21 (×2): qty 1

## 2012-12-21 MED ORDER — SODIUM CHLORIDE 0.9 % IV SOLN
25.0000 ug/h | INTRAVENOUS | Status: DC
  Administered 2012-12-21 – 2012-12-22 (×3): 25 ug/h via INTRAVENOUS
  Filled 2012-12-21 (×4): qty 1

## 2012-12-21 MED ORDER — OXYCODONE HCL 5 MG PO TABS: 5.0000 mg | ORAL_TABLET | Freq: Four times a day (QID) | ORAL | Status: DC | PRN

## 2012-12-21 MED ORDER — VITAMIN B-1 100 MG PO TABS
100.0000 mg | ORAL_TABLET | Freq: Every day | ORAL | Status: DC
Start: 2012-12-21 — End: 2012-12-26
  Administered 2012-12-24 – 2012-12-26 (×3): 100 mg via ORAL
  Filled 2012-12-21 (×6): qty 1

## 2012-12-21 MED ORDER — SODIUM CHLORIDE 0.9 % IJ SOLN
3.0000 mL | Freq: Two times a day (BID) | INTRAMUSCULAR | Status: DC
  Administered 2012-12-21 – 2012-12-24 (×5): 3 mL via INTRAVENOUS
  Administered 2012-12-25: 22:00:00 via INTRAVENOUS
  Administered 2012-12-25 – 2012-12-26 (×2): 3 mL via INTRAVENOUS

## 2012-12-21 NOTE — ED Provider Notes (Signed)
Joshua Waller is a 62 y.o. male who presents for evaluation of "whole body swelling." He also has dark, tarry stools, and left leg swelling. He complains of generalized pain that is not responsive to his Ultram. He does not currently have a primary care Dr.  Francia Greaves alert, calm, cooperative. Vital signs are normal.  Heart regular rate and rhythm. No murmur. Lungs clear to auscultation. Left lower leg swollen from the knee to the foot. Superficial ulcer, left great toe. Mild erythema, left foot. Neurologic-grossly nonfocal  Assessment: Diabetic foot infection with rectal bleeding and chronic pain. Patient is to be admitted for stabilization. Treatment begun in emergency department. Admission will be arranged.    Medical screening examination/treatment/procedure(s) were conducted as a shared visit with non-physician practitioner(s) and myself.  I personally evaluated the patient during the encounter  Nursing notes, applicable records and vitals reviewed.  Radiologic Images/Reports reviewed.   Flint Melter, MD 12/21/12 [phone]

## 2012-12-21 NOTE — ED Notes (Signed)
Alert, NAD, calm, interactive, skin W&D, resps e/u. Vascular lab studies finished at [facility], moving now to SD on monitor with RN. Rates LUQ pain 5/10.

## 2012-12-21 NOTE — ED Notes (Signed)
Myself and Danielle, NT wheeled pt back to room from the bathroom; placed pt back on monitor, continuous pulse oximetry and blood pressure cuff

## 2012-12-21 NOTE — Progress Notes (Signed)
VASCULAR LAB PRELIMINARY  PRELIMINARY  PRELIMINARY  PRELIMINARY  Left lower extremity venous duplex completed.    Preliminary report:  Left:  No evidence of DVT, superficial thrombosis, or Baker's cyst.  Joliyah Lippens, RVT 12/21/2012, 8:18 PM     

## 2012-12-21 NOTE — ED Provider Notes (Signed)
Medical screening examination/treatment/procedure(s) were performed by non-physician practitioner and as supervising physician I was immediately available for consultation/collaboration.   Flint Melter, MD 12/21/12 [phone]

## 2012-12-21 NOTE — ED Notes (Signed)
Pt reports chronic pain.  States that today he feels like the chronic swelling and pain is worse.  States that the toes on his feet are infected.  Pt also reports that he has a bleeding ulcer that is hurting and that his stools are dark.  Pt has multiple complaints.  NAD noted, pt A/O x 4.

## 2012-12-21 NOTE — ED Notes (Signed)
Patient transported to X-ray 

## 2012-12-21 NOTE — H&P (Signed)
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 Patient informs that he has been having worsening pain over his legs for past several weeks and has been taking ibuprofen off and on for this pain along with low back pain. He informs taking 2-3 tablets of 800 mg of ibuprofen for several weeks. He also informs drinking 4 days a week off anything between 1-6 beers daily. He also has been having dark stool for past 2 weeks. He denies any fever or chills, headache, blurry vision, chest pain, palpitations, abdominal pain, nausea, vomiting, bowel or urinary symptoms. He informs feeling fatigued however denies any dizziness.   Review of Systems:  Constitutional: Complaints of fatigue.  Denies fever, chills, diaphoresis, appetite change  HEENT: Denies photophobia, eye pain, redness, hearing loss, ear pain, congestion, sore throat, rhinorrhea, sneezing, mouth sores, trouble swallowing, neck pain, neck stiffness and tinnitus.   Respiratory: Denies SOB, DOE, cough, chest tightness,  and wheezing.   Cardiovascular: Denies chest pain, palpitations and leg swelling.  Gastrointestinal: Complains of nausea and dark stool. Denies vomiting, abdominal pain, diarrhea, constipation,and abdominal distention.  Genitourinary: Denies dysuria, urgency, frequency, hematuria, flank pain and difficulty urinating.  Musculoskeletal: Low back pain and worsening pain over bilateral lower leg with erythema and swelling of left leg. Denies  joint swelling,  arthralgias and gait problem.  Skin: Denies pallor, rash and wound.  Neurological: Denies dizziness, seizures, syncope, weakness, light-headedness, numbness and headaches.  Hematological: Denies adenopathy. Easy bruising, personal or family bleeding history  Psychiatric/Behavioral: Denies suicidal ideation, mood changes, confusion, nervousness, sleep disturbance and agitation   Past Medical History  Diagnosis Date  . Lumbar disc disorder   . Anemia   . Arthritis   . Neuropathy of lower extremity 2007    very poor balance  . Glaucoma (increased eye pressure)     bilateral   Past Surgical History  Procedure Date  . Esophagogastroduodenoscopy 12/29/2011    Procedure: ESOPHAGOGASTRODUODENOSCOPY (EGD);  Surgeon: Erick Blinks, MD;  Location: [facility] ENDOSCOPY;  Service: Gastroenterology;  Laterality: N/A;   Social History:  does not have a smoking history on file. He does not have any smokeless tobacco history on file. He reports that he drinks alcohol. He reports that he does not use illicit drugs.  No Known Allergies  History reviewed. No pertinent family history.  Prior to Admission medications   Medication Sig Start Date End Date Taking? Authorizing Provider  gabapentin (NEURONTIN) 600 MG tablet Take 600 mg by mouth 3 (three) times daily.   Yes Historical Provider, MD  ibuprofen (ADVIL,MOTRIN) 800 MG tablet Take 800 mg by mouth every 8 (eight) hours as needed. Pain from neuropathy   Yes Historical Provider, MD  Pregabalin (LYRICA PO) Take 1 tablet by mouth every 8 (eight) hours.   Yes Historical Provider, MD  traMADol (ULTRAM) 50 MG tablet Take 50 mg by mouth every 8 (eight) hours as needed. For fluid buildup/ pain   Yes Historical Provider, MD    Physical Exam:  Filed Vitals:   12/21/12 1442 12/21/12 1607  12/21/12 1625 12/21/12 1810  BP: 134/70 123/63 150/54 127/54  Pulse: 88 84 86 90  Temp: 98.2 F (36.8 C)     Resp: 18 18 21 16   SpO2: 100% 100% 100% 100%    Constitutional:  Vital signs reviewed.  Patient is a well-developed male no acute distress and cooperative with exam. Alert and oriented x3.  Head: Normocephalic and atraumatic Ear: TM normal bilaterally Mouth: no erythema or exudates, MMM, poor dentition Eyes: PERRL, EOMI, conjunctivae normal, No scleral icterus.  Neck: Supple, Trachea midline normal ROM, No JVD, mass, thyromegaly, or carotid bruit present.  Cardiovascular: RRR, S1 normal, S2 normal, no MRG, pulses symmetric and intact bilaterally Pulmonary/Chest: CTAB, no wheezes, rales, or rhonchi Abdominal: Soft. Non-tender, non-distended, bowel sounds are normal, no masses, organomegaly, or guarding present.  GU: no CVA tenderness Musculoskeletal: Swelling with erythema over left leg with redness over her great toe and increase erythema. Tender to palpation. No joint deformities, erythema, or stiffness, ROM full and no nontender Ext: no edema and no cyanosis, pulses palpable bilaterally (DP and PT) Hematology: no cervical, inginal, or axillary adenopathy.  Neurological: A&O x3, Strenght is normal and symmetric bilaterally, cranial nerve II-XII are grossly intact, no focal motor deficit, sensory intact to light touch bilaterally. No tremor  Skin: Warm, dry and intact. No rash, cyanosis, or clubbing.  Psychiatric: Normal mood and affect. speech and behavior is normal. Judgment and thought content normal. Cognition and memory are normal.   Labs on Admission:  Basic Metabolic Panel:  Lab 12/21/12 1096  NA 135  K 3.3*  CL 101  CO2 17*  GLUCOSE 131*  BUN 16  CREATININE 0.50  CALCIUM 8.3*  MG --  PHOS --   Liver Function Tests:  Lab 12/21/12 1520  AST 24  ALT 22  ALKPHOS 51  BILITOT 0.5  PROT 5.9*  ALBUMIN 2.5*   No results found for this basename: LIPASE:5,AMYLASE:5 in the last 168 hours No results found for this basename: AMMONIA:5 in the last 168 hours CBC:  Lab 12/21/12 1520  WBC 2.9*  NEUTROABS 1.5*  HGB 5.5*  HCT 16.8*  MCV 84.8   PLT 118*   Cardiac Enzymes: No results found for this basename: CKTOTAL:5,CKMB:5,CKMBINDEX:5,TROPONINI:5 in the last 168 hours BNP: No components found with this basename: POCBNP:5 CBG: No results found for this basename: GLUCAP:5 in the last 168 hours  Radiological Exams on Admission: Dg Chest 2 View  12/21/2012  *RADIOLOGY REPORT*  Clinical Data: Shortness of breath and lower extremity swelling.  CHEST - 2 VIEW  Comparison: None  Findings: The heart size appears upper limits of normal. Interstitial prominence noted.  No overt edema.  No pleural effusion identified.  Mild multilevel spondylosis noted within the thoracic spine.  IMPRESSION:  1.  Interstitial prominence without overt edema or effusion.   Original Report Authenticated By: Signa Kell, M.D.     EKG: Normal sinus rhythm at 85, no ST-T changes prolonged QTC at 509  Assessment/Plan Principal Problem:  *GI bleed Patient has history of gastric ulcer and Mallory-Weiss syndrome. He comes in with hemoglobin of 5.6. Likely in the setting off and GI bleed with melanotic stools in the setting of Active NSAIDs  and alcohol use. -Admit to step down monitoring. -Patient has been ordered for 2 units PRBC from the ED. Monitor serial H&H. Patient will be n.p.o. [facility] is being consulted from the ED and will follow up with recommendation. Patient will be placed on PPI and octreotide drip. -Avoid  all NSAIDs.   Active Problems:  ETOH abuse No signs of alcohol withdrawal at this time. We'll place him octreotide drip and she will protocol. Patient counseled on alcohol cessation.  Cellulitis of left lower extremity. Patient given a dose of IV clindamycin in the ED I. will place him on IV clindamycin 600 mg every 8 hours. Check a Doppler lower extremity to rule out DVT and x-ray has been ordered from ED to rule out osteomyelitis and will follow. Tylenol when necessary for fever   Neuropathy Continue Lyrica. Monitor  QTC   Hypokalemia and anion gap. Likely in the setting of dehydration and GI bleed. Will hydrate him with IV normal saline and monitor labs.  Leukopenia and pancytopenia. Possibly In the setting of active alcohol use. We'll monitor for any infection  DVT  prophylaxis: Contraindicated due to active GI bleed.  Code Status: Full Family Communication: None at bedside Disposition Plan: *Currently inpatient. Admit to step down monitoring  Eddie North [facility] Pager [phone]  If 7PM-7AM, please contact night-coverage [email or web address] Password [facility] 12/21/2012, 6:57 PM   Total time spent on admission: 70 MINUTES

## 2012-12-21 NOTE — ED Provider Notes (Signed)
History     CSN: [id number]  Arrival date & time 12/21/12  1424   First MD Initiated Contact with Patient 12/21/12 1618      Chief Complaint  Patient presents with  . Leg Pain    (Consider location/radiation/quality/duration/timing/severity/associated sxs/prior treatment) HPI Comments: Patient is a 61 year old male who presents with a past medical history of gastric ulcer who presents with chronic bilateral leg pain. Patient reports leg swelling at baseline but today is worse than usual. Patient also reports a history of foot sores. He reports gradual onset and progressive worsening of pain that is throbbing and severe. Pain is made worse by walking. No alleviating factors. Patient also reports a history of a bleeding gastric ulcer that is causing pain currently. The pain is sharp, severe in his upper abdomen and does not radiate. He reports associated dark stools. He has not tried anything for relief. No aggravating/alleviating factors. No other associated symptoms.    Past Medical History  Diagnosis Date  . Lumbar disc disorder   . Anemia   . Arthritis   . Neuropathy of lower extremity 2007    very poor balance  . Glaucoma (increased eye pressure)     bilateral    Past Surgical History  Procedure Date  . Esophagogastroduodenoscopy 12/29/2011    Procedure: ESOPHAGOGASTRODUODENOSCOPY (EGD);  Surgeon: Erick Blinks, MD;  Location: [facility] ENDOSCOPY;  Service: Gastroenterology;  Laterality: N/A;    History reviewed. No pertinent family history.  History  Substance Use Topics  . Smoking status: Not on file  . Smokeless tobacco: Not on file  . Alcohol Use: Yes     Comment: heavy drinker      Review of Systems  Cardiovascular: Positive for leg swelling.  Musculoskeletal: Positive for myalgias.  All other systems reviewed and are negative.    Allergies  Review of patient's allergies indicates no known allergies.  Home Medications   Current Outpatient Rx  Name  Route  Sig   Dispense  Refill  . GABAPENTIN 400 MG PO CAPS   Oral   Take 400 mg by mouth 3 (three) times daily.           . IBUPROFEN 800 MG PO TABS   Oral   Take 800 mg by mouth every 8 (eight) hours as needed. Pain from neuropathy          . OMEPRAZOLE 20 MG PO CPDR   Oral   Take 1 capsule (20 mg total) by mouth 2 (two) times daily.   60 capsule   6   . TRAMADOL HCL 50 MG PO TABS   Oral   Take 50 mg by mouth every 8 (eight) hours as needed. pain            BP 150/54  Pulse 86  Temp 98.2 F (36.8 C)  Resp 21  SpO2 100%  Physical Exam  Nursing note and vitals reviewed. Constitutional: He is oriented to person, place, and time. He appears well-developed and well-nourished. No distress.  HENT:  Head: Normocephalic and atraumatic.  Mouth/Throat: Oropharynx is clear and moist. No oropharyngeal exudate.  Eyes: EOM are normal. Pupils are equal, round, and reactive to light. No scleral icterus.       Pale conjunctiva.   Neck: Normal range of motion. Neck supple.  Cardiovascular: Normal rate and regular rhythm.  Exam reveals no gallop and no friction rub.   No murmur heard. Pulmonary/Chest: Effort normal and breath sounds normal. He has no wheezes.  He has no rales. He exhibits no tenderness.  Abdominal: Soft. He exhibits no distension. There is tenderness. There is no rebound and no guarding.       Generalized tenderness to palpation.   Musculoskeletal: Normal range of motion.       Bilateral leg and foot swelling, 2+ pitting edema, more notable on the left. Left foot warm, erythematous, and tender to palpation. No calf tenderness to palpation.   Neurological: He is alert and oriented to person, place, and time. Coordination normal.       Speech is goal-oriented. Moves limbs without ataxia.   Skin: Skin is warm and dry. He is not diaphoretic.  Psychiatric: He has a normal mood and affect. His behavior is normal.    ED Course  Procedures (including critical care time)  CRITICAL  CARE Performed by: Emilia Beck   Total critical care time: 30 min  Critical care time was exclusive of separately billable procedures and treating other patients.  Critical care was necessary to treat or prevent imminent or life-threatening deterioration.  Critical care was time spent personally by me on the following activities: development of treatment plan with patient and/or surrogate as well as nursing, discussions with consultants, evaluation of patient's response to treatment, examination of patient, obtaining history from patient or surrogate, ordering and performing treatments and interventions, ordering and review of laboratory studies, ordering and review of radiographic studies, pulse oximetry and re-evaluation of patient's condition.    Date: 12/21/2012  Rate: 85  Rhythm: normal sinus rhythm  QRS Axis: normal  Intervals: QT prolonged  ST/T Wave abnormalities: nonspecific T wave changes  Conduction Disutrbances:none  Narrative Interpretation: NSR unchanged from previous  Old EKG Reviewed: none available    Labs Reviewed  CBC WITH DIFFERENTIAL - Abnormal; Notable for the following:    WBC 2.9 (*)     RBC 1.98 (*)     Hemoglobin 5.5 (*)     HCT 16.8 (*)     RDW 17.1 (*)     Platelets 118 (*)  PLATELET COUNT CONFIRMED BY SMEAR   Neutro Abs 1.5 (*)     Monocytes Relative 15 (*)     All other components within normal limits  COMPREHENSIVE METABOLIC PANEL - Abnormal; Notable for the following:    Potassium 3.3 (*)     CO2 17 (*)     Glucose, Bld 131 (*)     Calcium 8.3 (*)     Total Protein 5.9 (*)     Albumin 2.5 (*)     All other components within normal limits  PREPARE RBC (CROSSMATCH)   Dg Chest 2 View  12/21/2012  *RADIOLOGY REPORT*  Clinical Data: Shortness of breath and lower extremity swelling.  CHEST - 2 VIEW  Comparison: None  Findings: The heart size appears upper limits of normal. Interstitial prominence noted.  No overt edema.  No pleural  effusion identified.  Mild multilevel spondylosis noted within the thoracic spine.  IMPRESSION:  1.  Interstitial prominence without overt edema or effusion.   Original Report Authenticated By: Signa Kell, M.D.      1. GI bleed       MDM  4:36 PM Patient will have fluids, clindamycin, and RBCs. Patient will have potassium for hypokalemia of 3.3. Patient will be admitted.   6:37 PM Patient will be admitted for upper GI bleed. GI consulted.   6:58 PM GI will see the patient.     Emilia Beck, [place] 12/21/12 2337

## 2012-12-22 ENCOUNTER — Encounter (HOSPITAL_COMMUNITY): Payer: Self-pay

## 2012-12-22 ENCOUNTER — Encounter (HOSPITAL_COMMUNITY)
Admission: EM | Disposition: A | Discharge status: Home / Self Care | Payer: Self-pay | Source: Home / Self Care | Attending: Internal Medicine

## 2012-12-22 HISTORY — PX: ESOPHAGOGASTRODUODENOSCOPY: SHX5428

## 2012-12-22 LAB — COMPREHENSIVE METABOLIC PANEL
Albumin: 2.4 g/dL — ABNORMAL LOW (ref 3.5–5.2)
BUN: 11 mg/dL (ref 6–23)
Calcium: 7.7 mg/dL — ABNORMAL LOW (ref 8.4–10.5)
Creatinine, Ser: 0.59 mg/dL (ref 0.50–1.35)
GFR calc Af Amer: >90 mL/min (ref >90)
Glucose, Bld: 142 mg/dL — ABNORMAL HIGH (ref 70–99)
Total Protein: 5.4 g/dL — ABNORMAL LOW (ref 6.0–8.3)

## 2012-12-22 LAB — CBC
HCT: 17.8 % — ABNORMAL LOW (ref 39.0–52.0)
MCV: 85.2 fL (ref 78.0–100.0)
Platelets: 93 10*3/uL — ABNORMAL LOW (ref 150–400)
RBC: 2.09 MIL/uL — ABNORMAL LOW (ref 4.22–5.81)
RDW: 16.5 % — ABNORMAL HIGH (ref 11.5–15.5)
WBC: 2.1 10*3/uL — ABNORMAL LOW (ref 4.0–10.5)

## 2012-12-22 LAB — PREPARE RBC (CROSSMATCH)

## 2012-12-22 SURGERY — EGD (ESOPHAGOGASTRODUODENOSCOPY)
Anesthesia: Moderate Sedation

## 2012-12-22 MED ORDER — OXYCODONE HCL 5 MG PO TABS
5.0000 mg | ORAL_TABLET | ORAL | Status: DC | PRN
  Administered 2012-12-25: 10 mg via ORAL
  Filled 2012-12-22: qty 1

## 2012-12-22 MED ORDER — MIDAZOLAM HCL 10 MG/2ML IJ SOLN
INTRAMUSCULAR | Status: DC | PRN
  Administered 2012-12-22 (×2): 2 mg via INTRAVENOUS

## 2012-12-22 MED ORDER — MIDAZOLAM HCL 5 MG/ML IJ SOLN
INTRAMUSCULAR | Status: AC
  Filled 2012-12-22: qty 3

## 2012-12-22 MED ORDER — FENTANYL CITRATE 0.05 MG/ML IJ SOLN
INTRAMUSCULAR | Status: AC
  Filled 2012-12-22: qty 4

## 2012-12-22 MED ORDER — SODIUM CHLORIDE 0.9 % IV SOLN: INTRAVENOUS | Status: DC

## 2012-12-22 MED ORDER — PANTOPRAZOLE SODIUM 40 MG IV SOLR
40.0000 mg | Freq: Two times a day (BID) | INTRAVENOUS | Status: DC
  Administered 2012-12-22 (×2): 40 mg via INTRAVENOUS
  Filled 2012-12-22 (×4): qty 40

## 2012-12-22 MED ORDER — PEG 3350-KCL-NA BICARB-NACL 420 G PO SOLR
4000.0000 mL | Freq: Once | ORAL | Status: AC
  Administered 2012-12-22: 4000 mL via ORAL
  Filled 2012-12-22: qty 4000

## 2012-12-22 MED ORDER — BUTAMBEN-TETRACAINE-BENZOCAINE 2-2-14 % EX AERO
INHALATION_SPRAY | CUTANEOUS | Status: DC | PRN
  Administered 2012-12-22: 2 via TOPICAL

## 2012-12-22 MED ORDER — DIPHENHYDRAMINE HCL 50 MG/ML IJ SOLN
INTRAMUSCULAR | Status: AC
  Filled 2012-12-22: qty 1

## 2012-12-22 MED ORDER — SODIUM CHLORIDE 0.9 % IV BOLUS (SEPSIS)
500.0000 mL | Freq: Once | INTRAVENOUS | Status: AC
  Administered 2012-12-22: 500 mL via INTRAVENOUS

## 2012-12-22 MED ORDER — BOOST / RESOURCE BREEZE PO LIQD
1.0000 | Freq: Three times a day (TID) | ORAL | Status: DC
  Administered 2012-12-22 – 2012-12-26 (×10): 1 via ORAL

## 2012-12-22 MED ORDER — FENTANYL CITRATE 0.05 MG/ML IJ SOLN
INTRAMUSCULAR | Status: DC | PRN
  Administered 2012-12-22 (×2): 25 ug via INTRAVENOUS

## 2012-12-22 NOTE — Progress Notes (Signed)
PT Cancellation Note  Patient Details Name: Brandun Pinn MRN: [id number] DOB: 12/04/1951   Cancelled Treatment:    Reason Eval/Treat Not Completed: Medical issues which prohibited therapy;Patient at procedure or test/unavailable.  Patient with HGB 4.8 and going for testing.  Will return in am for PT evaluation.   Vena Austria 12/22/2012, 11:25 AM [phone]

## 2012-12-22 NOTE — Consult Note (Addendum)
Referring Provider: Dr. Gonzella Lex Primary Care Physician:  Georganna Skeans, MD Primary Gastroenterologist: Gentry Fitz  Reason for Consultation:  Melena  HPI: Joshua Waller is a 61 y.o. male presenting with a one week history of black loose stools, nausea and epigastric pain (short episodes) where the black stools would occur once to several times per day. Has felt weak and lightheaded intermittently for a week. Denies vomiting. Drinks 2-6 beers every 3-4 days for years. Takes one Ibuprofen 800mg /day for years for arthritis. Denies other NSAIDs. Antral gastric ulcer (bx showed chronic gastritis without H. Pylori) on EGD in January 2013 seen by Dr. Rhea Belton. Hgb 4.8 on admit and hemodynamically stable. S/P 2 U PRBCs and post-transfusion Hgb pending. Denies any abdominal pain now. Black stools occurred again today. Has never had a colonoscopy.  Past Medical History  Diagnosis Date  . Lumbar disc disorder   . Anemia   . Arthritis   . Neuropathy of lower extremity 2007    very poor balance  . Glaucoma (increased eye pressure)     bilateral    Past Surgical History  Procedure Date  . Esophagogastroduodenoscopy 12/29/2011    Procedure: ESOPHAGOGASTRODUODENOSCOPY (EGD);  Surgeon: Erick Blinks, MD;  Location: [facility] ENDOSCOPY;  Service: Gastroenterology;  Laterality: N/A;    Prior to Admission medications   Medication Sig Start Date End Date Taking? Authorizing Provider  gabapentin (NEURONTIN) 600 MG tablet Take 600 mg by mouth 3 (three) times daily.   Yes Historical Provider, MD  ibuprofen (ADVIL,MOTRIN) 800 MG tablet Take 800 mg by mouth every 8 (eight) hours as needed. Pain from neuropathy   Yes Historical Provider, MD  Pregabalin (LYRICA PO) Take 1 tablet by mouth every 8 (eight) hours.   Yes Historical Provider, MD  traMADol (ULTRAM) 50 MG tablet Take 50 mg by mouth every 8 (eight) hours as needed. For fluid buildup/ pain   Yes Historical Provider, MD    Scheduled Meds:   . clindamycin (CLEOCIN) IV   600 mg Intravenous Q8H  . folic acid  1 mg Oral Daily  . gabapentin  600 mg Oral TID  . multivitamin with minerals  1 tablet Oral Daily  . pregabalin  25 mg Oral BID  . sodium chloride  3 mL Intravenous Q12H  . thiamine  100 mg Oral Daily   Or  . thiamine  100 mg Intravenous Daily   Continuous Infusions:   . sodium chloride 1,000 mL (12/22/12 0156)  . octreotide (SANDOSTATIN) infusion 25 mcg/hr (12/21/12 2316)  . pantoprozole (PROTONIX) infusion 8 mg/hr (12/21/12 2105)   PRN Meds:.acetaminophen, LORazepam, LORazepam, oxyCODONE  Allergies as of 12/21/2012  . (No Known Allergies)    History reviewed. No pertinent family history.  History   Social History  . Marital Status: Divorced    Spouse Name: N/A    Number of Children: N/A  . Years of Education: N/A   Occupational History  . Not on file.   Social History Main Topics  . Smoking status: Not on file  . Smokeless tobacco: Not on file  . Alcohol Use: Yes     Comment: heavy drinker  . Drug Use: No  . Sexually Active:    Other Topics Concern  . Not on file   Social History Narrative  . No narrative on file    Review of Systems: All negative except as stated above in HPI.  Physical Exam: Vital signs: Filed Vitals:   12/22/12 0742  BP: 96/46  Pulse: 70  Temp: 98.4 F (36.9  C)  Resp: 18   Last BM Date: 12/21/12 General:   Lethargic,  Well-developed, well-nourished, pleasant and cooperative in NAD Lungs:  Clear throughout to auscultation.   No wheezes, crackles, or rhonchi. No acute distress. Heart:  Regular rate and rhythm; no murmurs, clicks, rubs,  or gallops. Abdomen: soft, nontender, nondistended, +BS  Rectal:  Deferred Ext: 2+ nonpitting edema bilaterally  GI:  Lab Results:  Basename 12/21/12 2209 12/21/12 1520  WBC 1.9* 2.9*  HGB 4.8* 5.5*  HCT 14.4* 16.8*  PLT 88* 118*   BMET  Basename 12/21/12 1520  NA 135  K 3.3*  CL 101  CO2 17*  GLUCOSE 131*  BUN 16  CREATININE 0.50    CALCIUM 8.3*   LFT  Basename 12/21/12 1520  PROT 5.9*  ALBUMIN 2.5*  AST 24  ALT 22  ALKPHOS 51  BILITOT 0.5  BILIDIR --  IBILI --   PT/INR No results found for this basename: LABPROT:2,INR:2 in the last 72 hours   Studies/Results: Dg Chest 2 View  12/21/2012  *RADIOLOGY REPORT*  Clinical Data: Shortness of breath and lower extremity swelling.  CHEST - 2 VIEW  Comparison: None  Findings: The heart size appears upper limits of normal. Interstitial prominence noted.  No overt edema.  No pleural effusion identified.  Mild multilevel spondylosis noted within the thoracic spine.  IMPRESSION:  1.  Interstitial prominence without overt edema or effusion.   Original Report Authenticated By: Signa Kell, M.D.    Dg Foot Complete Left  12/21/2012  *RADIOLOGY REPORT*  Clinical Data: Rule out osteomyelitis  LEFT FOOT - COMPLETE 3+ VIEW  Comparison: None  Findings: Bones appear osteopenic.  No acute fracture or subluxation identified.  There is marked dorsal soft tissue swelling.  No focal areas of bony destruction noted.  IMPRESSION:  1.  No evidence for osteomyelitis. 2.  Dorsal soft tissue swelling.   Original Report Authenticated By: Signa Kell, M.D.     Impression/Plan: 61yo with one week of melena concerning for upper GI bleed from a peptic ulcer. History of peptic ulcer a year ago. Needs EGD today to further evaluate. NPO. Continue PPI infusion. May need colonoscopy if EGD unrevealing.    LOS: 1 day   Artemio Dobie C.  12/22/2012, 9:09 AM

## 2012-12-22 NOTE — H&P (View-Only) (Signed)
VASCULAR LAB PRELIMINARY  PRELIMINARY  PRELIMINARY  PRELIMINARY  Left lower extremity venous duplex completed.    Preliminary report:  Left:  No evidence of DVT, superficial thrombosis, or Baker's cyst.  Banyan Goodchild, RVT 12/21/2012, 8:18 PM

## 2012-12-22 NOTE — Op Note (Signed)
[facility] [address] [place], [address]   ENDOSCOPY PROCEDURE REPORT  PATIENT: Joshua Waller, Joshua Waller  MR#: [id number] BIRTHDATE: [birth year] , 61  yrs. old GENDER: Male  ENDOSCOPIST: Charlott Rakes, MD REFERRED BY:  PROCEDURE DATE:  12/22/2012 PROCEDURE:   EGD w/ biopsy ASA CLASS:   Class III INDICATIONS:Melena.   Acute post hemorrhagic anemia. MEDICATIONS: Fentanyl 50 mcg IV, Versed 4 mg IV, and Cetacaine spray x 2  TOPICAL ANESTHETIC:  DESCRIPTION OF PROCEDURE:   After the risks benefits and alternatives of the procedure were thoroughly explained, informed consent was obtained.  The Pentax Gastroscope [id number]  endoscope was introduced through the mouth and advanced to the second portion of the duodenum , limited by Without limitations.   The instrument was slowly withdrawn as the mucosa was fully examined.     FINDINGS: The endoscope was inserted into the oropharynx and esophagus was intubated.  The gastroesophageal junction was noted to be 40 cm from the incisors. The esophagus was normal in appearance.  Endoscope was advanced into the stomach, which revealed clear fluid and no blood products. In the antrum there was a nodular area with superficial ulcerations noted.  The endoscope was advanced to the duodenal bulb and second portion of duodenum which were unremarkable.  The endoscope was withdrawn back into the stomach and retroflexion revealed a small hiatal hernia. Biopsies were taken of the antrum to send for histology. No active bleeding or blood products were seen on insertion.  COMPLICATIONS: None  ENDOSCOPIC IMPRESSION:     1. Distal superficial antral ulcerations and surrounding nodular mucosa without active bleeding - s/p biopsies 2. Small hiatal hernia 3. No definite source of bleeding seen  RECOMMENDATIONS: 1. Change to IV PPI Q 12 hours 2. Clears 3. Transfuse and follow counts 4. Colonoscopy   REPEAT  EXAM: N/A  _______________________________ Charlott Rakes, MD eSigned:  Charlott Rakes, MD 12/22/2012 11:51 AM    CC:  PATIENT NAME:  Adelfo, Diebel MR#: [id number]

## 2012-12-22 NOTE — Interval H&P Note (Signed)
History and Physical Interval Note:  12/22/2012 11:22 AM  Joshua Waller  has presented today for surgery, with the diagnosis of GI Bleed  The various methods of treatment have been discussed with the patient and family. After consideration of risks, benefits and other options for treatment, the patient has consented to  Procedure(s) (LRB) with comments: ESOPHAGOGASTRODUODENOSCOPY (EGD) (N/A) as a surgical intervention .  The patient's history has been reviewed, patient examined, no change in status, stable for surgery.  I have reviewed the patient's chart and labs.  Questions were answered to the patient's satisfaction.     Nanie Dunkleberger C.

## 2012-12-22 NOTE — Plan of Care (Signed)
Problem: Phase II Progression Outcomes Goal: No active bleeding Outcome: Not Progressing Pt continues to pass black stool. Advanced to clear diet. See flow sheet for improved HBG post third unit blood.
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
## 2012-12-22 NOTE — Brief Op Note (Signed)
No source of bleeding seen on EGD. Superficial ulcerations noted that were clean-based and tiny. Needs colonoscopy. Transfuse as planned by primary team. Will change PPI to IV Q 12 hours. Clears.

## 2012-12-22 NOTE — Progress Notes (Signed)
INITIAL NUTRITION ASSESSMENT  DOCUMENTATION CODES Per approved criteria  -Obesity Unspecified   INTERVENTION:  Designer, jewellery 3 times daily between meals (250 kcals, 9 gm protein per 8 fl oz carton) RD to follow for nutrition care plan  NUTRITION DIAGNOSIS: Inadequate protein-energy intake related to decreased appetite, concern for upper GI bleed as evidenced by clear liquid diet  Goal: Oral intake with meals to meet >/= 90% of estimated nutrition needs  Monitor:  PO intake, weight, labs, I/O's  Reason for Assessment: Consult  61 y.o. male  Admitting Dx: GI bleed  ASSESSMENT: Patient presented with a one week history of black loose stools, nausea and epigastric pain (short episodes) where the black stools would occur once to several times per day; s/p EGD: no definite source of bleeding seen; needs colonoscopy; reports his appetite is OK; no reported weight loss; would benefit from addition of supplements -- RD to order.  Height: Ht Readings from Last 1 Encounters:  12/21/12 5' 10.8" (1.798 m)    Weight: Wt Readings from Last 1 Encounters:  12/21/12 233 lb 11 oz (106 kg)    Ideal Body Weight: 75.4 kg  % Ideal Body Weight: 141%  Wt Readings from Last 10 Encounters:  12/21/12 233 lb 11 oz (106 kg)  12/21/12 233 lb 11 oz (106 kg)  12/29/11 190 lb (86.183 kg)  12/29/11 190 lb (86.183 kg)    Usual Body Weight: 190 lb  % Usual Body Weight: 122%  BMI:  Body mass index is 32.78 kg/(m^2).  Estimated Nutritional Needs: Kcal: 2100-2300 Protein: 110-120 gm Fluid: 2.1-2.3 L  Skin: toe wound  Diet Order: Clear Liquid  EDUCATION NEEDS: -No education needs identified at this time   Intake/Output Summary (Last 24 hours) at 12/22/12 1335 Last data filed at 12/22/12 1315  Gross per 24 hour  Intake 2332.5 ml  Output    800 ml  Net 1532.5 ml    Last BM: 12/27  Labs:   Lab 12/22/12 0900 12/21/12 2209 12/21/12 1520  NA 137 -- 135  K 4.0 -- 3.3*  CL  107 -- 101  CO2 20 -- 17*  BUN 11 -- 16  CREATININE 0.59 -- 0.50  CALCIUM 7.7* -- 8.3*  MG -- 2.0 --  PHOS -- -- --  GLUCOSE 142* -- 131*    Scheduled Meds:   . clindamycin (CLEOCIN) IV  600 mg Intravenous Q8H  . folic acid  1 mg Oral Daily  . gabapentin  600 mg Oral TID  . multivitamin with minerals  1 tablet Oral Daily  . pantoprazole (PROTONIX) IV  40 mg Intravenous Q12H  . polyethylene glycol-electrolytes  4,000 mL Oral Once  . pregabalin  25 mg Oral BID  . sodium chloride  3 mL Intravenous Q12H  . thiamine  100 mg Oral Daily   Or  . thiamine  100 mg Intravenous Daily    Continuous Infusions:   . sodium chloride 1,000 mL (12/22/12 0156)  . sodium chloride    . sodium chloride    . octreotide (SANDOSTATIN) infusion 25 mcg/hr (12/22/12 1257)    Past Medical History  Diagnosis Date  . Lumbar disc disorder   . Anemia   . Arthritis   . Neuropathy of lower extremity 2007    very poor balance  . Glaucoma (increased eye pressure)     bilateral    Past Surgical History  Procedure Date  . Esophagogastroduodenoscopy 12/29/2011    Procedure: ESOPHAGOGASTRODUODENOSCOPY (EGD);  Surgeon: Erick Blinks,  MD;  Location: [facility];  Service: Gastroenterology;  Laterality: N/A;  . Tonsillectomy     Kirkland Hun, RD, LDN Pager #: [phone] After-Hours Pager #: [phone]

## 2012-12-23 ENCOUNTER — Encounter (HOSPITAL_COMMUNITY)
Admission: EM | Disposition: A | Discharge status: Home / Self Care | Payer: Self-pay | Source: Home / Self Care | Attending: Internal Medicine

## 2012-12-23 ENCOUNTER — Encounter (HOSPITAL_COMMUNITY): Payer: Self-pay | Admitting: *Deleted

## 2012-12-23 HISTORY — PX: COLONOSCOPY: SHX5424

## 2012-12-23 LAB — CBC
HCT: 24.3 % — ABNORMAL LOW (ref 39.0–52.0)
Hemoglobin: 8 g/dL — ABNORMAL LOW (ref 13.0–17.0)
MCH: 27.9 pg (ref 26.0–34.0)
MCH: 28.5 pg (ref 26.0–34.0)
Platelets: 107 10*3/uL — ABNORMAL LOW (ref 150–400)
Platelets: 117 10*3/uL — ABNORMAL LOW (ref 150–400)
RBC: 2.65 MIL/uL — ABNORMAL LOW (ref 4.22–5.81)
RBC: 2.81 MIL/uL — ABNORMAL LOW (ref 4.22–5.81)
RDW: 16.4 % — ABNORMAL HIGH (ref 11.5–15.5)
WBC: 3.1 10*3/uL — ABNORMAL LOW (ref 4.0–10.5)

## 2012-12-23 SURGERY — COLONOSCOPY
Anesthesia: Moderate Sedation

## 2012-12-23 MED ORDER — FENTANYL CITRATE 0.05 MG/ML IJ SOLN
INTRAMUSCULAR | Status: AC
  Filled 2012-12-23: qty 4

## 2012-12-23 MED ORDER — DIPHENHYDRAMINE HCL 50 MG/ML IJ SOLN
INTRAMUSCULAR | Status: DC | PRN
  Administered 2012-12-23: 6.25 mg via INTRAVENOUS

## 2012-12-23 MED ORDER — MIDAZOLAM HCL 5 MG/5ML IJ SOLN
INTRAMUSCULAR | Status: DC | PRN
  Administered 2012-12-23 (×2): 2.5 mg via INTRAVENOUS

## 2012-12-23 MED ORDER — CLINDAMYCIN HCL 300 MG PO CAPS: 300.0000 mg | ORAL_CAPSULE | Freq: Three times a day (TID) | ORAL | Status: DC

## 2012-12-23 MED ORDER — CLINDAMYCIN HCL 300 MG PO CAPS
300.0000 mg | ORAL_CAPSULE | Freq: Three times a day (TID) | ORAL | Status: DC
  Administered 2012-12-24 – 2012-12-26 (×8): 300 mg via ORAL
  Filled 2012-12-23 (×9): qty 1

## 2012-12-23 MED ORDER — MIDAZOLAM HCL 5 MG/ML IJ SOLN
INTRAMUSCULAR | Status: AC
  Filled 2012-12-23: qty 3

## 2012-12-23 MED ORDER — PANTOPRAZOLE SODIUM 40 MG PO TBEC
40.0000 mg | DELAYED_RELEASE_TABLET | Freq: Two times a day (BID) | ORAL | Status: DC
  Administered 2012-12-24 – 2012-12-26 (×6): 40 mg via ORAL
  Filled 2012-12-23 (×7): qty 1

## 2012-12-23 MED ORDER — DIPHENHYDRAMINE HCL 50 MG/ML IJ SOLN
INTRAMUSCULAR | Status: AC
Start: 2012-12-23 — End: 2012-12-23
  Filled 2012-12-23: qty 1

## 2012-12-23 MED ORDER — FENTANYL CITRATE 0.05 MG/ML IJ SOLN
INTRAMUSCULAR | Status: DC | PRN
  Administered 2012-12-23 (×2): 25 ug via INTRAVENOUS

## 2012-12-23 NOTE — Brief Op Note (Signed)
See endopro note for details. No source seen on colonoscopy. Outpt capsule if H/Hs stable. No further GI workup at this time. Will follow.
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
## 2012-12-23 NOTE — Interval H&P Note (Signed)
History and Physical Interval Note:  12/23/2012 10:12 AM  Joshua Waller  has presented today for surgery, with the diagnosis of GI Bleed  The various methods of treatment have been discussed with the patient and family. After consideration of risks, benefits and other options for treatment, the patient has consented to  Procedure(s) (LRB) with comments: COLONOSCOPY (N/A) as a surgical intervention .  The patient's history has been reviewed, patient examined, no change in status, stable for surgery.  I have reviewed the patient's chart and labs.  Questions were answered to the patient's satisfaction.     Zariana Strub C.

## 2012-12-23 NOTE — Op Note (Signed)
[facility] [address] [place], [address]   COLONOSCOPY PROCEDURE REPORT  PATIENT: Gregor, Dershem  MR#: [id number] BIRTHDATE: Oct 31, 1951 , 61  yrs. old GENDER: Male ENDOSCOPIST: Charlott Rakes, MD REFERRED BY: PROCEDURE DATE:  12/23/2012 PROCEDURE:   Colonoscopy with biopsy ASA CLASS:   Class II INDICATIONS:melena. MEDICATIONS: Fentanyl 50 mcg IV, Versed 5 mg IV, and Diphenhydramine (Benadryl) 6.25 mg IV  DESCRIPTION OF PROCEDURE:   After the risks benefits and alternatives of the procedure were thoroughly explained, informed consent was obtained.  The Pentax Ped Colon [id number]  endoscope was introduced through the anus and advanced to the cecum, which was identified by both the appendix and ileocecal valve , limited by No adverse events experienced.   The quality of the prep was good. . The instrument was then slowly withdrawn as the colon was fully examined.     FINDINGS:  Rectal exam unremarkable.  Pediatric colonoscope inserted into the colon and advanced to the cecum, where the appendiceal orifice and ileocecal valve were identified.   In the cecal base was a carpet-like lesion that was biopsied for histologic purposes. On careful withdrawal of the colonoscope a 1 cm sessile polyp was seen in the proximal ascending colon (one fold downstream from ICV) and this polyp was biopsied for histology. No other mucosal abnormalities were seen.    Retroflexion revealed small internal hemorrhoids.  COMPLICATIONS: None  IMPRESSION:     Carpet-like lesion in cecum (nonbleeding and no bleeding stigmata) - s/p biopsies Proximal ascending colon polyp - s/p biopsy Internal hemorrhoids No definite source of bleeding seen. Question small bowel AVMs.  RECOMMENDATIONS: Advance diet; F/U on path; No further GI work-up    ______________________________ eSignedCharlott Rakes, MD 12/23/2012 11:10 AM   CC:
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
## 2012-12-23 NOTE — Progress Notes (Signed)
PT Cancellation Note  Pt out of room for procedure and unavailable for PT evaluation.  PT to f/u at later time.  Aida Raider, PT  Office # [phone] Pager [phone]

## 2012-12-24 ENCOUNTER — Encounter (HOSPITAL_COMMUNITY): Payer: Self-pay | Admitting: Gastroenterology

## 2012-12-24 LAB — CBC
MCH: 28.8 pg (ref 26.0–34.0)
MCHC: 33.5 g/dL (ref 30.0–36.0)
Platelets: 103 10*3/uL — ABNORMAL LOW (ref 150–400)
RBC: 2.99 MIL/uL — ABNORMAL LOW (ref 4.22–5.81)
RDW: 16.7 % — ABNORMAL HIGH (ref 11.5–15.5)

## 2012-12-24 LAB — COMPREHENSIVE METABOLIC PANEL
ALT: 19 U/L (ref 0–53)
AST: 24 U/L (ref 0–37)
Alkaline Phosphatase: 50 U/L (ref 39–117)
CO2: 22 mEq/L (ref 19–32)
Chloride: 107 mEq/L (ref 96–112)
Creatinine, Ser: 0.54 mg/dL (ref 0.50–1.35)
GFR calc non Af Amer: >90 mL/min (ref >90)
Potassium: 3.6 mEq/L (ref 3.5–5.1)
Total Bilirubin: 0.5 mg/dL (ref 0.3–1.2)
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
## 2012-12-24 NOTE — Evaluation (Signed)
Physical Therapy Evaluation Patient Details Name: Joshua Waller MRN: [id number] DOB: 22-Sep-1951 Today's Date: 12/24/2012 Time: 1478-2956 PT Time Calculation (min): 19 min  PT Assessment / Plan / Recommendation Clinical Impression  Pt admitted for GIB and is currently mobilizing at baseline functional level. Pt reports having received OPPT for neuro and ortho 3x with the last being 28yrs ago. Pt reports therapist instructed him on use of light at night and need for cane for stability to prevent falls. Pt reports he did improve his balance with OPPT but does not currently see any need to pursue further therapy as this is his baseline and he is aware of his balance deficits related to his peripheral neuropathy and ETOH. Pt encouraged to increase use of cane and to mobilize with staff daily. Pt also educated for need to inspect feet daily as he reports he only does this if he has been very active walking that day. All education complete and no further needs at this time. Should pt desire further therapy OPPT for balance recommended. Pt was able to stop in hall and pick object off floor and perform head turns with gait without LOB.     PT Assessment  Patent does not need any further PT services    Follow Up Recommendations  No PT follow up    Does the patient have the potential to tolerate intense rehabilitation      Barriers to Discharge        Equipment Recommendations  None recommended by PT    Recommendations for Other Services     Frequency      Precautions / Restrictions Precautions Precautions: Fall   Pertinent Vitals/Pain No pain      Mobility  Bed Mobility Bed Mobility: Supine to Sit Supine to Sit: 6: Modified independent (Device/Increase time);HOB flat Transfers Transfers: Sit to Stand;Stand to Sit Sit to Stand: 6: Modified independent (Device/Increase time);From bed Stand to Sit: 6: Modified independent (Device/Increase time);To  chair/3-in-1 Ambulation/Gait Ambulation/Gait Assistance: 6: Modified independent (Device/Increase time) Ambulation Distance (Feet): 450 Feet Assistive device: None Ambulation/Gait Assistance Details: Pt with slightly decreased gait speed but able to ambulate with head turns without assist Gait Pattern: Within Functional Limits Stairs: Yes Stairs Assistance: 6: Modified independent (Device/Increase time) Stair Management Technique: One rail Right Number of Stairs: 3     Shoulder Instructions     Exercises     PT Diagnosis:    PT Problem List:   PT Treatment Interventions:     PT Goals    Visit Information  Last PT Received On: 12/24/12 Assistance Needed: +1    Subjective Data  Subjective: I've had poor balance for years because of my feet Patient Stated Goal: return home   Prior Functioning  Home Living Lives With: Alone (girlfriend part-time) Available Help at Discharge: Friend(s);Available PRN/intermittently Type of Home: House Home Access: Stairs to enter [employer] of Steps: 3 Entrance Stairs-Rails: Right Home Layout: One level Bathroom Shower/Tub: Engineer, manufacturing systems: Standard Home Adaptive Equipment: Straight cane Prior Function Level of Independence: Independent Able to Take Stairs?: Yes Driving: Yes Vocation: On disability Comments: Pt states he uses the cane at times when its a bad day and he knows his balance is off. Pt also reports that he uses a flashlight at night when getting up for safety Communication Communication: No difficulties    Cognition  Overall Cognitive Status: Appears within functional limits for tasks assessed/performed Arousal/Alertness: Awake/alert Orientation Level: Appears intact for tasks assessed Behavior During Session: [facility]  for tasks performed    Extremity/Trunk Assessment Right Upper Extremity Assessment RUE ROM/Strength/Tone: [facility] for tasks assessed Left Upper Extremity Assessment LUE  ROM/Strength/Tone: WFL for tasks assessed Right Lower Extremity Assessment RLE ROM/Strength/Tone: Within functional levels RLE Sensation: History of peripheral neuropathy Left Lower Extremity Assessment LLE ROM/Strength/Tone: Within functional levels LLE Sensation: History of peripheral neuropathy Trunk Assessment Trunk Assessment: Normal   Balance    End of Session PT - End of Session Equipment Utilized During Treatment: Gait belt Activity Tolerance: Patient tolerated treatment well Patient left: in chair;with call bell/phone within reach Nurse Communication: Mobility status  GP     Delorse Lek 12/24/2012, 8:43 AM  Delaney Meigs, PT [phone]

## 2012-12-24 NOTE — Progress Notes (Signed)
[facility] Progress Note  Subjective: Minimal bowel movements since colonoscopy, no visible blood  Objective: Vital signs in last 24 hours: Temp:  [97.4 F (36.3 C)-98.6 F (37 C)] 97.4 F (36.3 C) (12/30 0801) Pulse Rate:  [63-85] 68  (12/30 0801) Resp:  [12-41] 16  (12/30 0801) BP: (94-130)/(45-68) 130/66 mmHg (12/30 0801) SpO2:  [91 %-100 %] 100 % (12/30 0801) Weight:  [108 kg (238 lb 1.6 oz)] 108 kg (238 lb 1.6 oz) (12/30 0023) Weight change: 0 kg (0 lb)   PE: Unchanged  Lab Results: Results for orders placed during the hospital encounter of 12/21/12 (from the past 24 hour(s))  CBC     Status: Abnormal   Collection Time   12/23/12  8:50 PM      Component Value Range   WBC 3.2 (*) 4.0 - 10.5 K/uL   RBC 2.81 (*) 4.22 - 5.81 MIL/uL   Hemoglobin 8.0 (*) 13.0 - 17.0 g/dL   HCT 16.1 (*) 09.6 - 04.5 %   MCV 87.2  78.0 - 100.0 fL   MCH 28.5  26.0 - 34.0 pg   MCHC 32.7  30.0 - 36.0 g/dL   RDW 40.9 (*) 81.1 - 91.4 %   Platelets 107 (*) 150 - 400 K/uL  COMPREHENSIVE METABOLIC PANEL     Status: Abnormal   Collection Time   12/24/12  5:54 AM      Component Value Range   Sodium 141  135 - 145 mEq/L   Potassium 3.6  3.5 - 5.1 mEq/L   Chloride 107  96 - 112 mEq/L   CO2 22  19 - 32 mEq/L   Glucose, Bld 134 (*) 70 - 99 mg/dL   BUN 6  6 - 23 mg/dL   Creatinine, Ser 7.82  0.50 - 1.35 mg/dL   Calcium 8.3 (*) 8.4 - 10.5 mg/dL   Total Protein 6.0  6.0 - 8.3 g/dL   Albumin 2.7 (*) 3.5 - 5.2 g/dL   AST 24  0 - 37 U/L   ALT 19  0 - 53 U/L   Alkaline Phosphatase 50  39 - 117 U/L   Total Bilirubin 0.5  0.3 - 1.2 mg/dL   GFR calc non Af Amer >90  >90 mL/min   GFR calc Af Amer >90  >90 mL/min  CBC     Status: Abnormal   Collection Time   12/24/12  8:41 AM      Component Value Range   WBC 2.6 (*) 4.0 - 10.5 K/uL   RBC 2.99 (*) 4.22 - 5.81 MIL/uL   Hemoglobin 8.6 (*) 13.0 - 17.0 g/dL   HCT 95.6 (*) 21.3 - 08.6 %   MCV 86.0  78.0 - 100.0 fL   MCH 28.8  26.0 - 34.0 pg     MCHC 33.5  30.0 - 36.0 g/dL   RDW 57.8 (*) 46.9 - 62.9 %   Platelets 103 (*) 150 - 400 K/uL    Studies/Results: No results found.    Assessment: GI bleeding with no apparent source on EGD or colonoscopy., Hemoglobin stable over the last 2 days.  Plan: Consider capsule endoscopy at some point, would favor outpatient as long as has not dropped his hemoglobin while in hospital. We'll continue to monitor this but will allow him to eat a regular diet. If discharged he should followup with Dr. Bosie Clos    Nolon Yellin C 12/24/2012, 10:03 AM

## 2012-12-25 DIAGNOSIS — G589 Mononeuropathy, unspecified: Secondary | ICD-10-CM

## 2012-12-25 LAB — TYPE AND SCREEN
Unit division: 0
Unit division: 0
Unit division: 0

## 2012-12-25 LAB — HEMOGLOBIN AND HEMATOCRIT, BLOOD
HCT: 27.9 % — ABNORMAL LOW (ref 39.0–52.0)
Hemoglobin: 9.2 g/dL — ABNORMAL LOW (ref 13.0–17.0)

## 2012-12-25 LAB — OCCULT BLOOD X 1 CARD TO LAB, STOOL: Fecal Occult Bld: NEGATIVE

## 2012-12-25 LAB — CBC
MCHC: 33.5 g/dL (ref 30.0–36.0)
Platelets: 93 10*3/uL — ABNORMAL LOW (ref 150–400)
RDW: 16.5 % — ABNORMAL HIGH (ref 11.5–15.5)
WBC: 2.9 10*3/uL — ABNORMAL LOW (ref 4.0–10.5)

## 2012-12-25 MED ORDER — GI COCKTAIL ~~LOC~~
30.0000 mL | Freq: Once | ORAL | Status: AC
  Administered 2012-12-25: 30 mL via ORAL
  Filled 2012-12-25: qty 30

## 2012-12-25 MED ORDER — ONDANSETRON HCL 4 MG/2ML IJ SOLN
4.0000 mg | Freq: Four times a day (QID) | INTRAMUSCULAR | Status: DC | PRN
Start: 2012-12-25 — End: 2012-12-26
  Administered 2012-12-25: 4 mg via INTRAVENOUS
  Filled 2012-12-25: qty 2
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
## 2012-12-25 NOTE — Progress Notes (Signed)
Utilization Review Completed.Joshua Waller T12/31/2013

## 2012-12-25 NOTE — Progress Notes (Signed)
[facility] Progress Note  Subjective: Patient feels okay. Hemoglobin dropped a bit of 8.6-7.6. Stools formed and brown  Objective: Vital signs in last 24 hours: Temp:  [97.5 F (36.4 Waller)-98.4 F (36.9 Waller)] 97.5 F (36.4 Waller) (12/31 0814) Pulse Rate:  [65-74] 70  (12/31 0814) Resp:  [14-23] 22  (12/31 0814) BP: (100-123)/(47-77) 123/65 mmHg (12/31 0814) SpO2:  [92 %-100 %] 100 % (12/31 0814) Weight:  [107.4 kg (236 lb 12.4 oz)] 107.4 kg (236 lb 12.4 oz) (12/31 0600) Weight change: -0.6 kg (-1 lb 5.2 oz)   PE: Abdomen soft nondistended  Lab Results: Results for orders placed during the hospital encounter of 12/21/12 (from the past 24 hour(s))  CBC     Status: Abnormal   Collection Time   12/25/12  4:30 AM      Component Value Range   WBC 2.9 (*) 4.0 - 10.5 K/uL   RBC 2.64 (*) 4.22 - 5.81 MIL/uL   Hemoglobin 7.6 (*) 13.0 - 17.0 g/dL   HCT 47.8 (*) 29.5 - 62.1 %   MCV 86.0  78.0 - 100.0 fL   MCH 28.8  26.0 - 34.0 pg   MCHC 33.5  30.0 - 36.0 g/dL   RDW 30.8 (*) 65.7 - 84.6 %   Platelets 93 (*) 150 - 400 K/uL    Studies/Results: No results found.    Assessment: GI bleeding with fairly unrevealing EGD and colonoscopy. Unclear whether any ongoing occult bleeding  Plan: Patient has eaten breakfast so will hold off on capsule endoscopy which probably be low yield anyway. Hemoglobin remains stable then stools non-melenic hopefully can be discharged soon. Will followup tomorrow. Will transfuse one more unit of PRBCs and checked one more Hemoccult    Joshua Waller 12/25/2012, 9:49 AM

## 2012-12-25 NOTE — Progress Notes (Signed)
Called to lab and talked with Alona Bene about getting Type and screen. Miscommunication, she thought pt was RN draw. She was calling someone to collect type and screen. Jazmine Heckman L

## 2012-12-26 LAB — CBC
HCT: 27.1 % — ABNORMAL LOW (ref 39.0–52.0)
MCH: 28.8 pg (ref 26.0–34.0)
MCHC: 33.2 g/dL (ref 30.0–36.0)
RDW: 16 % — ABNORMAL HIGH (ref 11.5–15.5)

## 2012-12-26 LAB — TYPE AND SCREEN
ABO/RH(D): O NEG
Antibody Screen: NEGATIVE

## 2012-12-26 MED ORDER — ADULT MULTIVITAMIN W/MINERALS CH: 1.0000 | ORAL_TABLET | Freq: Every day | ORAL | Status: DC

## 2012-12-26 MED ORDER — THIAMINE HCL 100 MG PO TABS: 100.0000 mg | ORAL_TABLET | Freq: Every day | ORAL | Status: DC

## 2012-12-26 NOTE — Plan of Care (Signed)
Problem: Phase II Progression Outcomes Goal: No active bleeding Outcome: Progressing Hgb  9.2
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
## 2012-12-26 NOTE — Plan of Care (Signed)
Problem: Phase II Progression Outcomes Goal: No active bleeding Outcome: Progressing Not active bleeding noted at this time
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
## 2012-12-26 NOTE — Progress Notes (Signed)
Patient ID: Joshua Waller, male   DOB: 1951-09-25, 62 y.o.   MRN: [id number] [facility] Progress Note  Joshua Waller 62 y.o. Dec 01, 1951   Subjective: Patient reports 2 hours of LUQ pain last night that eventually resolved and has not returned. He thinks it was gas pains. Tolerating solid food. No complaints this morning. Coughing at times during my evaluation. Wants to go home. Hgb 9.2 yesterday after one unit PRBCs (up from 7.6). Heme neg X 2  Objective: Vital signs: Filed Vitals:   12/26/12 0812  BP: 103/62  Pulse: 70  Temp: 97.9 F (36.6 C)  Resp: 13    Physical Exam: Gen: alert, no acute distress  Abd: soft, nontender, nondistended, +BS  Lab Results:  [facility] 12/24/12 0554  NA 141  K 3.6  CL 107  CO2 22  GLUCOSE 134*  BUN 6  CREATININE 0.54  CALCIUM 8.3*  MG --  PHOS --    Basename 12/24/12 0554  AST 24  ALT 19  ALKPHOS 50  BILITOT 0.5  PROT 6.0  ALBUMIN 2.7*    Basename 12/25/12 1932 12/25/12 0430 12/24/12 0841  WBC -- 2.9* 2.6*  NEUTROABS -- -- --  HGB 9.2* 7.6* --  HCT 27.9* 22.7* --  MCV -- 86.0 86.0  PLT -- 93* 103*      Assessment/Plan: 61yo s/p GI bleed with pancytopenia. No GI source found but likely due to his ETOH abuse. Heme negative and no evidence of further bleeding. Will check CBC now. Question gas pains as source of his episode last night. Defer to primary team whether chest Xray needed for cough. Will recommend outpt f/u with me in 3 weeks and will plan for outpt capsule endoscopy. Ok to go home from GI standpoint. Will sign off. Call if questions.   Erilyn Pearman C. 12/26/2012, 10:22 AM
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
## 2013-09-05 ENCOUNTER — Inpatient Hospital Stay (HOSPITAL_COMMUNITY)
Admission: EM | Admit: 2013-09-05 | Discharge: 2013-09-11 | DRG: 424 | Payer: Medicare Other | Attending: Internal Medicine | Admitting: Internal Medicine

## 2013-09-05 ENCOUNTER — Encounter (HOSPITAL_COMMUNITY): Payer: Self-pay | Admitting: Emergency Medicine

## 2013-09-05 ENCOUNTER — Emergency Department (HOSPITAL_COMMUNITY): Payer: Medicare Other

## 2013-09-05 DIAGNOSIS — D126 Benign neoplasm of colon, unspecified: Secondary | ICD-10-CM

## 2013-09-05 DIAGNOSIS — K2970 Gastritis, unspecified, without bleeding: Secondary | ICD-10-CM

## 2013-09-05 DIAGNOSIS — K920 Hematemesis: Secondary | ICD-10-CM

## 2013-09-05 DIAGNOSIS — K766 Portal hypertension: Secondary | ICD-10-CM | POA: Diagnosis present

## 2013-09-05 DIAGNOSIS — K7030 Alcoholic cirrhosis of liver without ascites: Principal | ICD-10-CM | POA: Diagnosis present

## 2013-09-05 DIAGNOSIS — K648 Other hemorrhoids: Secondary | ICD-10-CM | POA: Diagnosis present

## 2013-09-05 DIAGNOSIS — H409 Unspecified glaucoma: Secondary | ICD-10-CM | POA: Diagnosis present

## 2013-09-05 DIAGNOSIS — R531 Weakness: Secondary | ICD-10-CM

## 2013-09-05 DIAGNOSIS — D649 Anemia, unspecified: Secondary | ICD-10-CM

## 2013-09-05 DIAGNOSIS — M1990 Unspecified osteoarthritis, unspecified site: Secondary | ICD-10-CM

## 2013-09-05 DIAGNOSIS — F1010 Alcohol abuse, uncomplicated: Secondary | ICD-10-CM

## 2013-09-05 DIAGNOSIS — Z0289 Encounter for other administrative examinations: Secondary | ICD-10-CM

## 2013-09-05 DIAGNOSIS — E876 Hypokalemia: Secondary | ICD-10-CM

## 2013-09-05 DIAGNOSIS — K319 Disease of stomach and duodenum, unspecified: Secondary | ICD-10-CM | POA: Diagnosis present

## 2013-09-05 DIAGNOSIS — I8510 Secondary esophageal varices without bleeding: Secondary | ICD-10-CM | POA: Diagnosis present

## 2013-09-05 DIAGNOSIS — D500 Iron deficiency anemia secondary to blood loss (chronic): Secondary | ICD-10-CM | POA: Diagnosis present

## 2013-09-05 DIAGNOSIS — D6959 Other secondary thrombocytopenia: Secondary | ICD-10-CM | POA: Diagnosis present

## 2013-09-05 DIAGNOSIS — R29898 Other symptoms and signs involving the musculoskeletal system: Secondary | ICD-10-CM | POA: Diagnosis present

## 2013-09-05 DIAGNOSIS — K921 Melena: Secondary | ICD-10-CM | POA: Diagnosis present

## 2013-09-05 DIAGNOSIS — I8500 Esophageal varices without bleeding: Secondary | ICD-10-CM

## 2013-09-05 DIAGNOSIS — F1020 Alcohol dependence, uncomplicated: Secondary | ICD-10-CM | POA: Diagnosis present

## 2013-09-05 DIAGNOSIS — E871 Hypo-osmolality and hyponatremia: Secondary | ICD-10-CM

## 2013-09-05 DIAGNOSIS — K922 Gastrointestinal hemorrhage, unspecified: Secondary | ICD-10-CM

## 2013-09-05 DIAGNOSIS — G5790 Unspecified mononeuropathy of unspecified lower limb: Secondary | ICD-10-CM | POA: Diagnosis present

## 2013-09-05 DIAGNOSIS — D696 Thrombocytopenia, unspecified: Secondary | ICD-10-CM

## 2013-09-05 DIAGNOSIS — K3189 Other diseases of stomach and duodenum: Secondary | ICD-10-CM

## 2013-09-05 DIAGNOSIS — R5381 Other malaise: Secondary | ICD-10-CM

## 2013-09-05 DIAGNOSIS — G629 Polyneuropathy, unspecified: Secondary | ICD-10-CM

## 2013-09-05 DIAGNOSIS — K7460 Unspecified cirrhosis of liver: Secondary | ICD-10-CM

## 2013-09-05 DIAGNOSIS — R161 Splenomegaly, not elsewhere classified: Secondary | ICD-10-CM

## 2013-09-05 DIAGNOSIS — D62 Acute posthemorrhagic anemia: Secondary | ICD-10-CM

## 2013-09-05 DIAGNOSIS — Z79899 Other long term (current) drug therapy: Secondary | ICD-10-CM

## 2013-09-05 DIAGNOSIS — K644 Residual hemorrhoidal skin tags: Secondary | ICD-10-CM | POA: Diagnosis present

## 2013-09-05 HISTORY — DX: Gastro-esophageal laceration-hemorrhage syndrome: K22.6

## 2013-09-05 HISTORY — DX: Gastritis, unspecified, without bleeding: K29.70

## 2013-09-05 HISTORY — DX: Alcohol abuse, uncomplicated: F10.10

## 2013-09-05 HISTORY — DX: Dorsalgia, unspecified: M54.9

## 2013-09-05 HISTORY — DX: Gastrointestinal hemorrhage, unspecified: K92.2

## 2013-09-05 HISTORY — DX: Other chronic pain: G89.29

## 2013-09-05 HISTORY — DX: Gastric ulcer, unspecified as acute or chronic, without hemorrhage or perforation: K25.9

## 2013-09-05 LAB — CBC
MCH: 24.5 pg — ABNORMAL LOW (ref 26.0–34.0)
MCV: 77.5 fL — ABNORMAL LOW (ref 78.0–100.0)
Platelets: 86 10*3/uL — ABNORMAL LOW (ref 150–400)
RBC: 1.51 MIL/uL — ABNORMAL LOW (ref 4.22–5.81)
RDW: 21.4 % — ABNORMAL HIGH (ref 11.5–15.5)
WBC: 7.2 10*3/uL (ref 4.0–10.5)

## 2013-09-05 LAB — COMPREHENSIVE METABOLIC PANEL
ALT: 22 U/L (ref 0–53)
AST: 34 U/L (ref 0–37)
Albumin: 2.5 g/dL — ABNORMAL LOW (ref 3.5–5.2)
CO2: 22 mEq/L (ref 19–32)
Calcium: 8.4 mg/dL (ref 8.4–10.5)
Creatinine, Ser: 0.7 mg/dL (ref 0.50–1.35)
GFR calc non Af Amer: >90 mL/min (ref >90)
Sodium: 127 mEq/L — ABNORMAL LOW (ref 135–145)

## 2013-09-05 LAB — ETHANOL: Alcohol, Ethyl (B): <11 mg/dL (ref 0–11)

## 2013-09-05 LAB — ABO/RH: ABO/RH(D): O NEG

## 2013-09-05 LAB — OCCULT BLOOD, POC DEVICE: Fecal Occult Bld: POSITIVE — AB

## 2013-09-05 LAB — APTT: aPTT: 28 seconds (ref 24–37)

## 2013-09-05 LAB — TROPONIN I: Troponin I: <0.3 ng/mL (ref <0.30)

## 2013-09-05 LAB — PREPARE RBC (CROSSMATCH)

## 2013-09-05 MED ORDER — SODIUM CHLORIDE 0.9 % IJ SOLN
3.0000 mL | INTRAMUSCULAR | Status: DC | PRN
  Administered 2013-09-08: 3 mL via INTRAVENOUS

## 2013-09-05 MED ORDER — SODIUM CHLORIDE 0.9 % IV SOLN
INTRAVENOUS | Status: DC
  Administered 2013-09-05: 18:00:00 via INTRAVENOUS

## 2013-09-05 MED ORDER — SODIUM CHLORIDE 0.9 % IV SOLN
250.0000 mL | INTRAVENOUS | Status: DC | PRN
  Administered 2013-09-08: 20 mL via INTRAVENOUS

## 2013-09-05 MED ORDER — SODIUM CHLORIDE 0.9 % IV SOLN
INTRAVENOUS | Status: DC
  Administered 2013-09-06: 100 mL/h via INTRAVENOUS
  Administered 2013-09-07 – 2013-09-08 (×2): via INTRAVENOUS

## 2013-09-05 MED ORDER — SODIUM CHLORIDE 0.9 % IV SOLN
80.0000 mg | Freq: Once | INTRAVENOUS | Status: AC
  Administered 2013-09-05: 18:00:00 80 mg via INTRAVENOUS
  Filled 2013-09-05: qty 80

## 2013-09-05 MED ORDER — SODIUM CHLORIDE 0.9 % IV SOLN: INTRAVENOUS | Status: DC

## 2013-09-05 MED ORDER — ONDANSETRON HCL 4 MG/2ML IJ SOLN: 4.0000 mg | Freq: Four times a day (QID) | INTRAMUSCULAR | Status: DC | PRN

## 2013-09-05 MED ORDER — SODIUM CHLORIDE 0.9 % IV SOLN
8.0000 mg/h | INTRAVENOUS | Status: DC
  Administered 2013-09-05 – 2013-09-06 (×2): 8 mg/h via INTRAVENOUS
  Filled 2013-09-05 (×4): qty 80

## 2013-09-05 MED ORDER — ONDANSETRON HCL 4 MG PO TABS: 4.0000 mg | ORAL_TABLET | Freq: Four times a day (QID) | ORAL | Status: DC | PRN

## 2013-09-05 MED ORDER — SODIUM CHLORIDE 0.9 % IJ SOLN
3.0000 mL | Freq: Two times a day (BID) | INTRAMUSCULAR | Status: DC
  Administered 2013-09-06 – 2013-09-07 (×3): 3 mL via INTRAVENOUS

## 2013-09-05 NOTE — ED Notes (Signed)
Bed: WA17 Expected date:  Expected time:  Means of arrival:  Comments: EMS-GI Bleed

## 2013-09-05 NOTE — ED Provider Notes (Signed)
CSN: [id number]     Arrival date & time 09/05/13  1610 History   First MD Initiated Contact with Patient 09/05/13 1638     Chief Complaint  Patient presents with  . GI Bleeding    HPI Pt was seen at 1655. Per detention facility staff and pt report, c/o gradual onset and persistence of multiple intermittent episodes of N/V/D for the past 5 days. Pt states he has been having "dark" stools and emesis. Endorses "there's been a little red blood in both." Has been associated with feeling "cold" and generally weak. Denies CP/palpitations, no SOB/cough, no abd pain, no back pain, no fevers.    Past Medical History  Diagnosis Date  . Lumbar disc disorder   . Anemia   . Arthritis   . Neuropathy of lower extremity 2007    very poor balance  . Glaucoma (increased eye pressure)     bilateral  . Gastric ulcer   . Chronic back pain   . Alcohol abuse   . 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 Gastritis   . GI bleeding    Past Surgical History  Procedure Laterality Date  . Esophagogastroduodenoscopy  12/29/2011    Procedure: ESOPHAGOGASTRODUODENOSCOPY (EGD);  Surgeon: Erick Blinks, MD;  Location: [facility] ENDOSCOPY;  Service: Gastroenterology;  Laterality: N/A;  . Tonsillectomy    . Esophagogastroduodenoscopy  12/22/2012    Procedure: ESOPHAGOGASTRODUODENOSCOPY (EGD);  Surgeon: Shirley Friar, MD;  Location: [facility] ENDOSCOPY;  Service: Endoscopy;  Laterality: N/A;  . Colonoscopy  12/23/2012    Procedure: COLONOSCOPY;  Surgeon: Shirley Friar, MD;  Location: [facility] ENDOSCOPY;  Service: Endoscopy;  Laterality: N/A;   History reviewed. No pertinent family history. History  Substance Use Topics  . Smoking status: Never Smoker   . Smokeless tobacco: Not on file  . Alcohol Use: Yes     Comment: heavy drinker    Review of Systems ROS: Statement: All systems negative except as marked or noted in the HPI; Constitutional: Negative for fever and chills. +generalized weakness/fatigue; ; Eyes: Negative for eye pain,  redness and discharge. ; ; ENMT: Negative for ear pain, hoarseness, nasal congestion, sinus pressure and sore throat. ; ; Cardiovascular: Negative for chest pain, palpitations, diaphoresis, dyspnea and peripheral edema. ; ; Respiratory: Negative for cough, wheezing and stridor. ; ; Gastrointestinal: Negative for abdominal pain, jaundice, +N/V/D, hematemesis, black stools, and rectal bleeding. . ; ; Genitourinary: Negative for dysuria, flank pain and hematuria. ; ; Musculoskeletal: Negative for back pain and neck pain. Negative for swelling and trauma.; ; Skin: Negative for pruritus, rash, abrasions, blisters, bruising and skin lesion.; ; Neuro: Negative for headache, lightheadedness and neck stiffness. Negative for altered level of consciousness , altered mental status, extremity weakness, paresthesias, involuntary movement, seizure and syncope.      Allergies  Review of patient's allergies indicates no known allergies.  Home Medications   Current Outpatient Rx  Name  Route  Sig  Dispense  Refill  . gabapentin (NEURONTIN) 600 MG tablet   Oral   Take 600 mg by mouth 3 (three) times daily.         . Pregabalin (LYRICA PO)   Oral   Take 1 capsule by mouth 3 (three) times daily.         . traMADol (ULTRAM) 50 MG tablet   Oral   Take 50 mg by mouth every 6 (six) hours as needed for pain.          BP 96/69  Pulse 80  Temp(Src) 98.3 F (36.8 C) (Oral)  Resp 16  SpO2 100% Physical Exam 1700: Physical examination:  Nursing notes reviewed; Vital signs and O2 SAT reviewed;  Constitutional: Well developed, Well nourished, In no acute distress; Head:  Normocephalic, atraumatic; Eyes: EOMI, PERRL, No scleral icterus. +conjunctiva pale.; ENMT: Mouth and pharynx normal, Mucous membranes dry; Neck: Supple, Full range of motion, No lymphadenopathy; Cardiovascular: Regular rate and rhythm, No gallop; Respiratory: Breath sounds clear & equal bilaterally, No rales, rhonchi, wheezes.  Speaking full  sentences with ease, Normal respiratory effort/excursion; Chest: Nontender, Movement normal; Abdomen: Soft, Nontender, Nondistended, Normal bowel sounds; Genitourinary: No CVA tenderness; Extremities: Pulses normal, No tenderness, No edema, No calf edema or asymmetry.; Neuro: AA&Ox3, Major CN grossly intact.  Speech clear. No gross focal motor or sensory deficits in extremities.; Skin: Color pale, Warm, Dry.   ED Course  Procedures     MDM  MDM Reviewed: previous chart, nursing note and vitals Reviewed previous: labs and ECG Interpretation: labs, ECG and x-ray Total time providing critical care: 30-74 minutes. This excludes time spent performing separately reportable procedures and services. Consults: admitting MD   CRITICAL CARE Performed by: Laray Anger Total critical care time: 45 Critical care time was exclusive of separately billable procedures and treating other patients. Critical care was necessary to treat or prevent imminent or life-threatening deterioration. Critical care was time spent personally by me on the following activities: development of treatment plan with patient and/or surrogate as well as nursing, discussions with consultants, evaluation of patient's response to treatment, examination of patient, obtaining history from patient or surrogate, ordering and performing treatments and interventions, ordering and review of laboratory studies, ordering and review of radiographic studies, pulse oximetry and re-evaluation of patient's condition.    Date: 09/05/2013  Rate: 82  Rhythm: normal sinus rhythm  QRS Axis: normal  Intervals: QT prolonged  ST/T Wave abnormalities: nonspecific ST/T changes anterior leads  Conduction Disutrbances:none  Narrative Interpretation:   Old EKG Reviewed: changes noted, new STTW changes compared to previous EKG dated 12/21/2012.   Results for orders placed during the hospital encounter of 09/05/13  CBC      Result Value Range    WBC 7.2  4.0 - 10.5 K/uL   RBC 1.51 (*) 4.22 - 5.81 MIL/uL   Hemoglobin 3.7 (*) 13.0 - 17.0 g/dL   HCT 16.1 (*) 09.6 - 04.5 %   MCV 77.5 (*) 78.0 - 100.0 fL   MCH 24.5 (*) 26.0 - 34.0 pg   MCHC 31.6  30.0 - 36.0 g/dL   RDW 40.9 (*) 81.1 - 91.4 %   Platelets 86 (*) 150 - 400 K/uL  COMPREHENSIVE METABOLIC PANEL      Result Value Range   Sodium 127 (*) 135 - 145 mEq/L   Potassium 3.5  3.5 - 5.1 mEq/L   Chloride 96  96 - 112 mEq/L   CO2 22  19 - 32 mEq/L   Glucose, Bld 192 (*) 70 - 99 mg/dL   BUN 29 (*) 6 - 23 mg/dL   Creatinine, Ser 7.82  0.50 - 1.35 mg/dL   Calcium 8.4  8.4 - 95.6 mg/dL   Total Protein 6.1  6.0 - 8.3 g/dL   Albumin 2.5 (*) 3.5 - 5.2 g/dL   AST 34  0 - 37 U/L   ALT 22  0 - 53 U/L   Alkaline Phosphatase 42  39 - 117 U/L   Total Bilirubin 1.2  0.3 - 1.2 mg/dL   GFR  calc non Af Amer >90  >90 mL/min   GFR calc Af Amer >90  >90 mL/min  LIPASE, BLOOD      Result Value Range   Lipase 19  11 - 59 U/L  APTT      Result Value Range   aPTT 28  24 - 37 seconds  PROTIME-INR      Result Value Range   Prothrombin Time 16.4 (*) 11.6 - 15.2 seconds   INR 1.36  0.00 - 1.49  ETHANOL      Result Value Range   Alcohol, Ethyl (B) <11  0 - 11 mg/dL  TROPONIN I      Result Value Range   Troponin I <0.30  <0.30 ng/mL  OCCULT BLOOD, POC DEVICE      Result Value Range   Fecal Occult Bld POSITIVE (*) NEGATIVE  TYPE AND SCREEN      Result Value Range   ABO/RH(D) O NEG     Antibody Screen NEG     Sample Expiration 09/08/2013    ABO/RH      Result Value Range   ABO/RH(D) O NEG     Dg Chest Portable 1 View 09/05/2013   *RADIOLOGY REPORT*  Clinical Data: Left-sided chest pain  PORTABLE CHEST - 1 VIEW  Comparison: -12/21/12  Findings: The cardiac shadow is stable.  The lungs are well-aerated bilaterally without focal infiltrate.  Minimal right basilar atelectasis is noted.  No acute bony abnormality is seen.  IMPRESSION: Minimal right basilar atelectasis.   Original Report  Authenticated By: Alcide Clever, M.D.   Results for FIELD, STANISZEWSKI (MRN [id number]) as of 09/05/2013 19:28  Ref. Range 12/24/2012 08:41 12/25/2012 04:30 12/25/2012 19:32 12/26/2012 11:33 09/05/2013 18:05  Hemoglobin Latest Range: 13.0-17.0 g/dL 8.6 (L) 7.6 (L) 9.2 (L) 9.0 (L) 3.7 (LL)  HCT Latest Range: 39.0-52.0 % 25.7 (L) 22.7 (L) 27.9 (L) 27.1 (L) 11.7 (L)  Platelets Latest Range: 150-400 K/uL 103 (L) 93 (L)  103 (L) 86 (L)    1930:  Orthostatic on VS. H/H lower than previous; will start transfusion in the ED. IV protonix bolus and gtt begun. Pt continues to mentate per baseline, resps easy. Denies CP/SOB/abd pain. No N/V or stooling while in the ED. Dx and testing d/w pt and family.  Questions answered.  Verb understanding, agreeable to admit.  T/C to [facility]. Onalee Hua, case discussed, including:  HPI, pertinent PM/SHx, VS/PE, dx testing, ED course and treatment:  Agreeable to admit, requests to write temporary orders, obtain stepdown bed to team 8.     Laray Anger, DO 09/07/13 1524

## 2013-09-05 NOTE — Progress Notes (Signed)
Utilization Review completed.  Odis Wickey RN CM  

## 2013-09-05 NOTE — ED Notes (Signed)
Pt from lockup. Pt reports that this am he began to have dark loose stool. Pt has dark stool on legs. Pt reports feeling colder and weaker than usual. Pt able to ambulate to EMS stretcher.

## 2013-09-05 NOTE — ED Notes (Signed)
Lab came to draw blood work.

## 2013-09-05 NOTE — ED Notes (Signed)
Patient had on a red [facility] jumpsuit size 4XL that was soiled with stool.  The officer with the patient gave [place] permission to throw it away because it was soiled.

## 2013-09-05 NOTE — H&P (Signed)
PCP:   [name], MD   Chief Complaint:  Vomiting blood  HPI: 62 yo male h/o gib, etoh abuse, arthritis comes in with vomiting coffee ground material on Saturday and melenotic stools for several days.  Was placed in jail also on sat.  Last etoh intake was Friday night (6 nights ago).  After several days of vomiting blood he started getting light headed and very weak.  No loc.  No cp.  No sob.  He has had no further vomiting or black stool for about 24 hours.  Finally brought to ED today when he almost passed out in jail.  No fevers.  Some mild epigastric pain but not really significant.  Denies any nsaid use since starting lyrica.  Review of Systems:  Positive and negative as per HPI otherwise all other systems are negative  Past Medical History: Past Medical History  Diagnosis Date  . Lumbar disc disorder   . Anemia   . Arthritis   . Neuropathy of lower extremity 2007    very poor balance  . Glaucoma (increased eye pressure)     bilateral  . Gastric ulcer   . Chronic back pain   . Alcohol abuse   . 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 Gastritis   . GI bleeding    Past Surgical History  Procedure Laterality Date  . Esophagogastroduodenoscopy  12/29/2011    Procedure: ESOPHAGOGASTRODUODENOSCOPY (EGD);  Surgeon: Erick Blinks, MD;  Location: [facility] ENDOSCOPY;  Service: Gastroenterology;  Laterality: N/A;  . Tonsillectomy    . Esophagogastroduodenoscopy  12/22/2012    Procedure: ESOPHAGOGASTRODUODENOSCOPY (EGD);  Surgeon: Shirley Friar, MD;  Location: [facility] ENDOSCOPY;  Service: Endoscopy;  Laterality: N/A;  . Colonoscopy  12/23/2012    Procedure: COLONOSCOPY;  Surgeon: Shirley Friar, MD;  Location: [facility] ENDOSCOPY;  Service: Endoscopy;  Laterality: N/A;    Medications: Prior to Admission medications   Medication Sig Start Date End Date Taking? Authorizing Provider  gabapentin (NEURONTIN) 600 MG tablet Take 600 mg by mouth 3 (three) times daily.   Yes Historical Provider, MD   Pregabalin (LYRICA PO) Take 1 capsule by mouth 3 (three) times daily.   Yes Historical Provider, MD  traMADol (ULTRAM) 50 MG tablet Take 50 mg by mouth every 6 (six) hours as needed for pain.   Yes Historical Provider, MD    Allergies:  No Known Allergies  Social History:  reports that he has never smoked. He does not have any smokeless tobacco history on file. He reports that  drinks alcohol. He reports that he does not use illicit drugs.  Family History: History reviewed. No pertinent family history.  Physical Exam: Filed Vitals:   09/05/13 1851 09/05/13 1853 09/05/13 1854 09/05/13 1944  BP: 120/49 101/39 96/69 96/69   Pulse: 81 89 95 80  Temp:      TempSrc:      Resp:    16  SpO2:    100%   General appearance: alert, cooperative, no distress and pale Head: Normocephalic, without obvious abnormality, atraumatic Eyes: negative Nose: Nares normal. Septum midline. Mucosa normal. No drainage or sinus tenderness. Neck: no JVD and supple, symmetrical, trachea midline Lungs: clear to auscultation bilaterally Heart: regular rate and rhythm, S1, S2 normal, no murmur, click, rub or gallop Abdomen: soft, non-tender; bowel sounds normal; no masses,  no organomegaly Extremities: extremities normal, atraumatic, no cyanosis or edema Pulses: 2+ and symmetric Skin: Skin color, texture, turgor normal. No rashes or lesions Neurologic: Grossly normal   Labs  on Admission:   Recent Labs  09/05/13 1805  NA 127*  K 3.5  CL 96  CO2 22  GLUCOSE 192*  BUN 29*  CREATININE 0.70  CALCIUM 8.4    Recent Labs  09/05/13 1805  AST 34  ALT 22  ALKPHOS 42  BILITOT 1.2  PROT 6.1  ALBUMIN 2.5*    Recent Labs  09/05/13 1805  LIPASE 19    Recent Labs  09/05/13 1805  WBC 7.2  HGB 3.7*  HCT 11.7*  MCV 77.5*  PLT 86*    Radiological Exams on Admission: Dg Chest Portable 1 View  09/05/2013   *RADIOLOGY REPORT*  Clinical Data: Left-sided chest pain  PORTABLE CHEST - 1 VIEW   Comparison: -12/21/12  Findings: The cardiac shadow is stable.  The lungs are well-aerated bilaterally without focal infiltrate.  Minimal right basilar atelectasis is noted.  No acute bony abnormality is seen.  IMPRESSION: Minimal right basilar atelectasis.   Original Report Authenticated By: Alcide Clever, M.D.    Assessment/Plan  62 yo male with significant blood loss symptomatic anemia due to ugib  Principal Problem:   Anemia associated with acute blood loss Active Problems:   ETOH abuse   Hematemesis/vomiting blood   Melena   Weakness generalized   Health examination of prisoner   GIB (gastrointestinal bleeding)   Hyponatremia  Seems his bleeding has stopped (no active bleeding for about 24 hours), will transfer 4 units slowly of prbc tonight.  If any further bleeding, may need ffp +- platelets, vit k.  Will hold off on that for now and transfuse unless any further bleeding.  Place in stepdown unit.  ???underlying cirrhosis.  Last egd did not show any varices.  Was suppose to get capsule outpt, this has not been done.  Will need to call GI in am.  FULL CODE.  Yvonnia Tango A 09/05/2013, 8:21 PM

## 2013-09-06 ENCOUNTER — Encounter (HOSPITAL_COMMUNITY): Payer: Self-pay

## 2013-09-06 ENCOUNTER — Encounter (HOSPITAL_COMMUNITY): Admission: EM | Payer: Self-pay | Source: Home / Self Care | Attending: Internal Medicine

## 2013-09-06 HISTORY — PX: ESOPHAGOGASTRODUODENOSCOPY: SHX5428

## 2013-09-06 LAB — CBC
HCT: 25.6 % — ABNORMAL LOW (ref 39.0–52.0)
Hemoglobin: 8.6 g/dL — ABNORMAL LOW (ref 13.0–17.0)
MCH: 27 pg (ref 26.0–34.0)
MCHC: 33.6 g/dL (ref 30.0–36.0)
MCV: 81 fL (ref 78.0–100.0)
MCV: 82.3 fL (ref 78.0–100.0)
Platelets: 77 10*3/uL — ABNORMAL LOW (ref 150–400)
RBC: 2.52 MIL/uL — ABNORMAL LOW (ref 4.22–5.81)

## 2013-09-06 LAB — PROTIME-INR: Prothrombin Time: 15.4 seconds — ABNORMAL HIGH (ref 11.6–15.2)

## 2013-09-06 SURGERY — EGD (ESOPHAGOGASTRODUODENOSCOPY)
Anesthesia: Moderate Sedation

## 2013-09-06 MED ORDER — MIDAZOLAM HCL 10 MG/2ML IJ SOLN
INTRAMUSCULAR | Status: DC | PRN
  Administered 2013-09-06 (×4): 2 mg via INTRAVENOUS

## 2013-09-06 MED ORDER — BUTAMBEN-TETRACAINE-BENZOCAINE 2-2-14 % EX AERO
INHALATION_SPRAY | CUTANEOUS | Status: DC | PRN
  Administered 2013-09-06: 2 via TOPICAL

## 2013-09-06 MED ORDER — DEXTROSE 5 % IV SOLN
1.0000 g | INTRAVENOUS | Status: DC
  Administered 2013-09-06 – 2013-09-10 (×5): 1 g via INTRAVENOUS
  Filled 2013-09-06 (×6): qty 10

## 2013-09-06 MED ORDER — LORAZEPAM 2 MG/ML IJ SOLN: 1.0000 mg | Freq: Four times a day (QID) | INTRAMUSCULAR | Status: DC | PRN

## 2013-09-06 MED ORDER — FUROSEMIDE 10 MG/ML IJ SOLN
INTRAMUSCULAR | Status: AC
  Filled 2013-09-06: qty 4

## 2013-09-06 MED ORDER — MIDAZOLAM HCL 10 MG/2ML IJ SOLN
INTRAMUSCULAR | Status: AC
  Filled 2013-09-06: qty 2

## 2013-09-06 MED ORDER — PNEUMOCOCCAL VAC POLYVALENT 25 MCG/0.5ML IJ INJ
0.5000 mL | INJECTION | INTRAMUSCULAR | Status: AC
  Administered 2013-09-07: 0.5 mL via INTRAMUSCULAR
  Filled 2013-09-06 (×2): qty 0.5

## 2013-09-06 MED ORDER — DIPHENHYDRAMINE HCL 50 MG/ML IJ SOLN
INTRAMUSCULAR | Status: AC
  Filled 2013-09-06: qty 1

## 2013-09-06 MED ORDER — ACETAMINOPHEN 325 MG PO TABS
650.0000 mg | ORAL_TABLET | Freq: Once | ORAL | Status: AC
  Administered 2013-09-06: 650 mg via ORAL
  Filled 2013-09-06: qty 2

## 2013-09-06 MED ORDER — INFLUENZA VAC SPLIT QUAD 0.5 ML IM SUSP
0.5000 mL | INTRAMUSCULAR | Status: AC
  Administered 2013-09-07: 0.5 mL via INTRAMUSCULAR
  Filled 2013-09-06 (×2): qty 0.5

## 2013-09-06 MED ORDER — FENTANYL CITRATE 0.05 MG/ML IJ SOLN
INTRAMUSCULAR | Status: AC
  Filled 2013-09-06: qty 2

## 2013-09-06 MED ORDER — SODIUM CHLORIDE 0.9 % IV SOLN: INTRAVENOUS | Status: DC

## 2013-09-06 MED ORDER — ADULT MULTIVITAMIN W/MINERALS CH
1.0000 | ORAL_TABLET | Freq: Every day | ORAL | Status: DC
  Administered 2013-09-06 – 2013-09-11 (×6): 1 via ORAL
  Filled 2013-09-06 (×6): qty 1

## 2013-09-06 MED ORDER — FUROSEMIDE 10 MG/ML IJ SOLN
20.0000 mg | Freq: Once | INTRAMUSCULAR | Status: AC
  Administered 2013-09-06: 20 mg via INTRAVENOUS

## 2013-09-06 MED ORDER — LORAZEPAM 1 MG PO TABS: 1.0000 mg | ORAL_TABLET | Freq: Four times a day (QID) | ORAL | Status: DC | PRN

## 2013-09-06 MED ORDER — DIPHENHYDRAMINE HCL 50 MG/ML IJ SOLN
INTRAMUSCULAR | Status: DC | PRN
  Administered 2013-09-06: 25 mg via INTRAVENOUS

## 2013-09-06 MED ORDER — LORAZEPAM 2 MG/ML IJ SOLN: 0.0000 mg | Freq: Four times a day (QID) | INTRAMUSCULAR | Status: DC

## 2013-09-06 MED ORDER — FOLIC ACID 1 MG PO TABS
1.0000 mg | ORAL_TABLET | Freq: Every day | ORAL | Status: DC
  Administered 2013-09-06 – 2013-09-11 (×6): 1 mg via ORAL
  Filled 2013-09-06 (×6): qty 1

## 2013-09-06 MED ORDER — THIAMINE HCL 100 MG/ML IJ SOLN
100.0000 mg | Freq: Every day | INTRAMUSCULAR | Status: DC
  Filled 2013-09-06 (×2): qty 1

## 2013-09-06 MED ORDER — FENTANYL CITRATE 0.05 MG/ML IJ SOLN
INTRAMUSCULAR | Status: DC | PRN
  Administered 2013-09-06: 25 ug via INTRAVENOUS

## 2013-09-06 MED ORDER — LORAZEPAM 1 MG PO TABS: 1.0000 mg | ORAL_TABLET | Freq: Four times a day (QID) | ORAL | Status: AC | PRN

## 2013-09-06 MED ORDER — VITAMIN B-1 100 MG PO TABS
100.0000 mg | ORAL_TABLET | Freq: Every day | ORAL | Status: DC
  Administered 2013-09-06 – 2013-09-11 (×6): 100 mg via ORAL
  Filled 2013-09-06 (×6): qty 1

## 2013-09-06 MED ORDER — LORAZEPAM 2 MG/ML IJ SOLN: 0.0000 mg | Freq: Two times a day (BID) | INTRAMUSCULAR | Status: DC

## 2013-09-06 MED ORDER — DIPHENHYDRAMINE HCL 25 MG PO CAPS
25.0000 mg | ORAL_CAPSULE | Freq: Once | ORAL | Status: AC
  Administered 2013-09-06: 25 mg via ORAL
  Filled 2013-09-06: qty 1

## 2013-09-06 MED ORDER — LORAZEPAM 2 MG/ML IJ SOLN: 1.0000 mg | Freq: Four times a day (QID) | INTRAMUSCULAR | Status: AC | PRN

## 2013-09-06 NOTE — H&P (View-Only) (Signed)
[facility] PROGRESS NOTE  Joshua Waller MRN:[id number] DOB: 09/26/1951 DOA: 09/05/2013 PCP: Wilson, Amelia P, MD  Assessment/Plan: #1. GIB Likely UGIB. Patient presents with Mrs. 2 days and 6 days of melanotic stools. On admission hemoglobin was noted to be 3.7. Patient with no further hematemesis or melanotic stools today. Patient is status post 4 units packed red blood cells. Hemoglobin is pending. Patient does have a history of alcohol abuse prior history of gastric ulcers per endoscopy of 12/22/2012, and and colonoscopy of 12/23/2012 with internal hemorrhoids and questionable small bowel AVMs. Patient was to have a capsule endoscopy as outpatient however this was never done. Patient is status post 4 units packed red blood cells. H&H is pending. Keep n.p.o. Continue protonix drip. IV fluids. Supportive care. GI has been consulted spoke with Dr. Hung and he will see the patient and patient will likely undergo upper endoscopy today. Continue serial CBCs.  #2 acute blood loss anemia Secondary to probable upper GI bleed. Hemoglobin on admission was 3.7. Patient is status post 4 units packed red blood cells. H&H is pending. GI has been consulted. See problem #1.  #3 history of alcohol abuse Patient states his last drink was 5 days prior to admission. Patient with no noted DTs. Will place on the Ativan withdrawal protocol. Place on thiamine and folic acid. Multivitamin. Follow.  #4 generalized weakness Likely secondary to symptomatic anemia. Patient is being transfused 4 units packed red blood cells. Follow. PT/OT.  #5 hyponatremia Likely secondary to hypovolemic hyponatremia in patient with upper GI bleed. IV fluids. Supportive care. Follow.  #6 prophylaxis PPI for GI prophylaxis. SCDs for DVT prophylaxis.  Code Status: Full Family Communication:updated patient at bedside Disposition Plan: Stay in SDU today   Consultants:  GI: Dr Hung pending  Procedures:  CXR 09/05/13  4 units  packed red blood cells  Antibiotics:  None  HPI/Subjective: Patient denies any further hemetemesis today. No melanotic stools today. Wants to eat.  Objective: Filed Vitals:   09/06/13 0715  BP: 100/52  Pulse: 70  Temp: 98.5 F (36.9 C)  Resp: 21    Intake/Output Summary (Last 24 hours) at 09/06/13 0847 Last data filed at 09/06/13 0600  Gross per 24 hour  Intake 1317.91 ml  Output    300 ml  Net 1017.91 ml   Filed Weights   09/05/13 2158  Weight: 109.4 kg (241 lb 2.9 oz)    Exam:   General:  Nad. Dry mucous membranes.  Cardiovascular: rrr  Respiratory: ctab  Abdomen: soft/nt/nd/+bs  Musculoskeletal: no c/c/e  Data Reviewed: Basic Metabolic Panel:  Recent Labs Lab 09/05/13 1805  NA 127*  K 3.5  CL 96  CO2 22  GLUCOSE 192*  BUN 29*  CREATININE 0.70  CALCIUM 8.4   Liver Function Tests:  Recent Labs Lab 09/05/13 1805  AST 34  ALT 22  ALKPHOS 42  BILITOT 1.2  PROT 6.1  ALBUMIN 2.5*    Recent Labs Lab 09/05/13 1805  LIPASE 19   No results found for this basename: AMMONIA,  in the last 168 hours CBC:  Recent Labs Lab 09/05/13 1805  WBC 7.2  HGB 3.7*  HCT 11.7*  MCV 77.5*  PLT 86*   Cardiac Enzymes:  Recent Labs Lab 09/05/13 1805  TROPONINI <0.30   BNP (last 3 results)  Recent Labs  12/21/12 1705  PROBNP 206.0*   CBG: No results found for this basename: GLUCAP,  in the last 168 hours  Recent Results (from the past   240 hour(s))  MRSA PCR SCREENING     Status: None   Collection Time    09/05/13  9:51 PM      Result Value Range Status   MRSA by PCR NEGATIVE  NEGATIVE Final   Comment:            The GeneXpert MRSA Assay (FDA     approved for NASAL specimens     only), is one component of a     comprehensive MRSA colonization     surveillance program. It is not     intended to diagnose MRSA     infection nor to guide or     monitor treatment for     MRSA infections.     Studies: Dg Chest Portable 1  View  09/05/2013   *RADIOLOGY REPORT*  Clinical Data: Left-sided chest pain  PORTABLE CHEST - 1 VIEW  Comparison: -12/21/12  Findings: The cardiac shadow is stable.  The lungs are well-aerated bilaterally without focal infiltrate.  Minimal right basilar atelectasis is noted.  No acute bony abnormality is seen.  IMPRESSION: Minimal right basilar atelectasis.   Original Report Authenticated By: Mark Lukens, M.D.    Scheduled Meds: . folic acid  1 mg Oral Daily  . [START ON 09/07/2013] influenza vac split quadrivalent PF  0.5 mL Intramuscular Tomorrow-1000  . multivitamin with minerals  1 tablet Oral Daily  . [START ON 09/07/2013] pneumococcal 23 valent vaccine  0.5 mL Intramuscular Tomorrow-1000  . sodium chloride  3 mL Intravenous Q12H  . thiamine  100 mg Oral Daily   Or  . thiamine  100 mg Intravenous Daily   Continuous Infusions: . sodium chloride    . pantoprozole (PROTONIX) infusion 8 mg/hr (09/06/13 0418)    Principal Problem:   GIB (gastrointestinal bleeding) Active Problems:   ETOH abuse   Anemia associated with acute blood loss   Hematemesis/vomiting blood   Melena   Weakness generalized   Health examination of prisoner   Hyponatremia    Time spent: > 35 MINS    THOMPSON,DANIEL  [facility] Pager [phone]. If 7PM-7AM, please contact night-coverage at [email or web address], password [email or web address] 09/06/2013, 8:47 AM  LOS: 1 day             

## 2013-09-06 NOTE — Op Note (Addendum)
[facility] ,   OPERATIVE PROCEDURE REPORT  PATIENT: Joshua Waller, Joshua Waller  MR#: [id number] BIRTHDATE: [birth year]  GENDER: Male ENDOSCOPIST: Jeani Hawking, MD ASSISTANT:   Jadene Pierini, technician and Jamal Maes, RN PROCEDURE DATE: 09/06/2013 PROCEDURE:   EGD w/ band ligation of varices ASA CLASS:   Class III INDICATIONS:Hematemesis. MEDICATIONS: Versed 8 mg IV, Fentanyl 25 mcg IV, and Benadryl 25 mg IV TOPICAL ANESTHETIC: DESCRIPTION OF PROCEDURE:   After the risks benefits and alternatives of the procedure were thoroughly explained, informed consent was obtained.  The endoscope  endoscope was introduced through the mouth  and advanced to the third portion of the duodenum Without limitations.      The instrument was slowly withdrawn as the mucosa was fully examined.   FINDINGS: In the distal esophagus there was evidence of small varices.  No clear sites of bleeding.  The gastric mucosa revealed a portal hypertensive gastropathy and smoe gastritis.  Cold biopsies were obtained.  No evidence of any ulcers and no fundic varices.  Deep intubation of the duodenum was achieved and the mucosa was normal.  I then made the decision to band the varices. It is most likely that the varices are small as a result of his severe hemorrhage.  Three bands were successfully deployed.  No other bands were able to be placed.  No bleeding was precipitated with the procedure.   Retroflexed views revealed no abnormalities. The scope was then withdrawn from the patient and the procedure terminated. COMPLICATIONS: There were no complications. IMPRESSION: 1) Esophageal varices s/p banding. 2) Portal HTN gastropathy. 3) Gastritis.  RECOMMENDATIONS: 1) Continue with blood transfusions. 2) Repeat EGD with banding in 3-4 weeks. 3) Ceftriaxone 1 gram QD. 4) Colonoscopy on Monday for polypectomy. 5) Check for HCV, HBV,  and AFP. 6) RUQ U/S.  _______________________________ eSignedJeani Hawking, MD 09/06/2013 3:26 PM

## 2013-09-06 NOTE — Interval H&P Note (Signed)
History and Physical Interval Note:  09/06/2013 2:37 PM  Joshua Waller  has presented today for surgery, with the diagnosis of Melena and anemia  The various methods of treatment have been discussed with the patient and family. After consideration of risks, benefits and other options for treatment, the patient has consented to  Procedure(s) with comments: ESOPHAGOGASTRODUODENOSCOPY (EGD) (N/A) - bedside as a surgical intervention .  The patient's history has been reviewed, patient examined, no change in status, stable for surgery.  I have reviewed the patient's chart and labs.  Questions were answered to the patient's satisfaction.     Munira Polson D

## 2013-09-06 NOTE — Progress Notes (Signed)
CARE MANAGEMENT NOTE 09/06/2013  Patient:  Joshua Waller, Joshua Waller   Account Number:  [id number]  Date Initiated:  09/06/2013  Documentation initiated by:  Jalaysia Lobb  Subjective/Objective Assessment:   Active and profuse gi bleed, hgb 3.4 on admit, pt received transfusions and iv volume expanders.     Action/Plan:   home when stable   Anticipated DC Date:  09/09/2013   Anticipated DC Plan:  HOME/SELF CARE  In-house referral  NA      DC Planning Services  NA      PAC Choice  NA   Choice offered to / List presented to:  NA   DME arranged  NA      DME agency  NA     HH arranged  NA      HH agency  NA   Status of service:  In process, will continue to follow Medicare Important Message given?  NA - LOS <3 / Initial given by admissions (If response is "NO", the following Medicare IM given date fields will be blank) Date Medicare IM given:   Date Additional Medicare IM given:    Discharge Disposition:    Per UR Regulation:  Reviewed for med. necessity/level of care/duration of stay  If discussed at Long Length of Stay Meetings, dates discussed:    Comments:  [id number] Stark Jock, BSN, [place] [phone] Chart Reviewed for discharge and hospital needs. Discharge needs at time of review:  None Review of patient progress due on [id number].

## 2013-09-06 NOTE — Consult Note (Signed)
Reason for Consult: Severe anemia, coffee-ground emesis, and melena Referring Physician: [facility]  Suzzanne Cloud HPI: This is a 62 year old male without a history of GI bleed presents to the hospital with an HGB in the 3 range.  He reports having issues with vomiting coffee ground emesis and subsequently he started to have melenic stools.  The patient has a history of ETOH abuse and last Friday was his last drink.  He is currently in jail for a dispute issue.  While in jail he started to feel much weaker and he had an associated SOB.  Blood work was performed and he was noted to be severely anemic.  In the past he was evaluated with an EGD by Dr. Rhea Belton and a Mallory-Weiss tear was found as well as a nonbleeding small antral ulcer (12/2011).  In 11/2012 Dr. Bosie Clos performed an EGD/Colonoscopy for a GI bleedin, but no overt bleeding site was found.  Dr. Bosie Clos did remove some colonic polyps, but a moderately sized cecal polyp was left in place.  At the time of the colonoscopy, Dr. Bosie Clos was not able to determine if this was a true polyp.  However, the cold biopsy of the lesion revealed that it was an adenoma.  He reports that he never followed up with Dr. Bosie Clos.  No complaints of chest pain and his HGB has increased with blood transfusions.    Past Medical History  Diagnosis Date  . Lumbar disc disorder   . Anemia   . Arthritis   . Neuropathy of lower extremity 2007    very poor balance  . Glaucoma (increased eye pressure)     bilateral  . Gastric ulcer   . Chronic back pain   . Alcohol abuse   . 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 Gastritis   . GI bleeding     Past Surgical History  Procedure Laterality Date  . Esophagogastroduodenoscopy  12/29/2011    Procedure: ESOPHAGOGASTRODUODENOSCOPY (EGD);  Surgeon: Erick Blinks, MD;  Location: [facility] ENDOSCOPY;  Service: Gastroenterology;  Laterality: N/A;  . Tonsillectomy    . Esophagogastroduodenoscopy  12/22/2012    Procedure:  ESOPHAGOGASTRODUODENOSCOPY (EGD);  Surgeon: Shirley Friar, MD;  Location: [facility] ENDOSCOPY;  Service: Endoscopy;  Laterality: N/A;  . Colonoscopy  12/23/2012    Procedure: COLONOSCOPY;  Surgeon: Shirley Friar, MD;  Location: [facility] ENDOSCOPY;  Service: Endoscopy;  Laterality: N/A;    History reviewed. No pertinent family history.  Social History:  reports that he has never smoked. He does not have any smokeless tobacco history on file. He reports that  drinks alcohol. He reports that he does not use illicit drugs.  Allergies: No Known Allergies  Medications:  Scheduled: . folic acid  1 mg Oral Daily  . furosemide  20 mg Intravenous Once  . [START ON 09/07/2013] influenza vac split quadrivalent PF  0.5 mL Intramuscular Tomorrow-1000  . multivitamin with minerals  1 tablet Oral Daily  . [START ON 09/07/2013] pneumococcal 23 valent vaccine  0.5 mL Intramuscular Tomorrow-1000  . sodium chloride  3 mL Intravenous Q12H  . thiamine  100 mg Oral Daily   Or  . thiamine  100 mg Intravenous Daily   Continuous: . sodium chloride 100 mL/hr (09/06/13 1247)  . sodium chloride    . pantoprozole (PROTONIX) infusion 8 mg/hr (09/06/13 1200)    Results for orders placed during the hospital encounter of 09/05/13 (from the past 24 hour(s))  TYPE AND SCREEN     Status: None  Collection Time    09/05/13  4:27 PM      Result Value Range   ABO/RH(D) O NEG     Antibody Screen NEG     Sample Expiration 09/08/2013     Unit Number [id number]     Blood Component Type RBC LR PHER2     Unit division 00     Status of Unit ISSUED,FINAL     Transfusion Status OK TO TRANSFUSE     Crossmatch Result Compatible     Unit Number [id number]     Blood Component Type RED CELLS,LR     Unit division 00     Status of Unit ISSUED     Transfusion Status OK TO TRANSFUSE     Crossmatch Result Compatible     Unit Number [id number]     Blood Component Type RED CELLS,LR     Unit division 00     Status of  Unit ISSUED     Transfusion Status OK TO TRANSFUSE     Crossmatch Result Compatible     Unit Number [id number]     Blood Component Type RBC LR PHER1     Unit division 00     Status of Unit ISSUED     Transfusion Status OK TO TRANSFUSE     Crossmatch Result Compatible     Unit Number [id number]     Blood Component Type RBC LR PHER2     Unit division 00     Status of Unit ISSUED     Transfusion Status OK TO TRANSFUSE     Crossmatch Result Compatible     Unit Number [id number]     Blood Component Type RBC LR PHER1     Unit division 00     Status of Unit ALLOCATED     Transfusion Status OK TO TRANSFUSE     Crossmatch Result Compatible    OCCULT BLOOD, POC DEVICE     Status: Abnormal   Collection Time    09/05/13  4:56 PM      Result Value Range   Fecal Occult Bld POSITIVE (*) NEGATIVE  CBC     Status: Abnormal   Collection Time    09/05/13  6:05 PM      Result Value Range   WBC 7.2  4.0 - 10.5 K/uL   RBC 1.51 (*) 4.22 - 5.81 MIL/uL   Hemoglobin 3.7 (*) 13.0 - 17.0 g/dL   HCT 57.3 (*) 22.0 - 25.4 %   MCV 77.5 (*) 78.0 - 100.0 fL   MCH 24.5 (*) 26.0 - 34.0 pg   MCHC 31.6  30.0 - 36.0 g/dL   RDW 27.0 (*) 62.3 - 76.2 %   Platelets 86 (*) 150 - 400 K/uL  COMPREHENSIVE METABOLIC PANEL     Status: Abnormal   Collection Time    09/05/13  6:05 PM      Result Value Range   Sodium 127 (*) 135 - 145 mEq/L   Potassium 3.5  3.5 - 5.1 mEq/L   Chloride 96  96 - 112 mEq/L   CO2 22  19 - 32 mEq/L   Glucose, Bld 192 (*) 70 - 99 mg/dL   BUN 29 (*) 6 - 23 mg/dL   Creatinine, Ser 8.31  0.50 - 1.35 mg/dL   Calcium 8.4  8.4 - 51.7 mg/dL   Total Protein 6.1  6.0 - 8.3 g/dL   Albumin 2.5 (*) 3.5 - 5.2 g/dL   AST 34  0 -  37 U/L   ALT 22  0 - 53 U/L   Alkaline Phosphatase 42  39 - 117 U/L   Total Bilirubin 1.2  0.3 - 1.2 mg/dL   GFR calc non Af Amer >90  >90 mL/min   GFR calc Af Amer >90  >90 mL/min  LIPASE, BLOOD     Status: None   Collection Time    09/05/13  6:05 PM       Result Value Range   Lipase 19  11 - 59 U/L  APTT     Status: None   Collection Time    09/05/13  6:05 PM      Result Value Range   aPTT 28  24 - 37 seconds  PROTIME-INR     Status: Abnormal   Collection Time    09/05/13  6:05 PM      Result Value Range   Prothrombin Time 16.4 (*) 11.6 - 15.2 seconds   INR 1.36  0.00 - 1.49  ETHANOL     Status: None   Collection Time    09/05/13  6:05 PM      Result Value Range   Alcohol, Ethyl (B) <11  0 - 11 mg/dL  TROPONIN I     Status: None   Collection Time    09/05/13  6:05 PM      Result Value Range   Troponin I <0.30  <0.30 ng/mL  ABO/RH     Status: None   Collection Time    09/05/13  6:30 PM      Result Value Range   ABO/RH(D) O NEG    PREPARE RBC (CROSSMATCH)     Status: None   Collection Time    09/05/13  8:00 PM      Result Value Range   Order Confirmation ORDER PROCESSED BY BLOOD BANK    MRSA PCR SCREENING     Status: None   Collection Time    09/05/13  9:51 PM      Result Value Range   MRSA by PCR NEGATIVE  NEGATIVE  PREPARE RBC (CROSSMATCH)     Status: None   Collection Time    09/05/13 10:30 PM      Result Value Range   Order Confirmation ORDER PROCESSED BY BLOOD BANK    PROTIME-INR     Status: Abnormal   Collection Time    09/06/13 11:05 AM      Result Value Range   Prothrombin Time 15.4 (*) 11.6 - 15.2 seconds   INR 1.25  0.00 - 1.49  APTT     Status: Abnormal   Collection Time    09/06/13 11:05 AM      Result Value Range   aPTT <20 (*) 24 - 37 seconds  CBC     Status: Abnormal   Collection Time    09/06/13 11:10 AM      Result Value Range   WBC 6.0  4.0 - 10.5 K/uL   RBC 2.52 (*) 4.22 - 5.81 MIL/uL   Hemoglobin 6.8 (*) 13.0 - 17.0 g/dL   HCT 96.0 (*) 45.4 - 09.8 %   MCV 81.0  78.0 - 100.0 fL   MCH 27.0  26.0 - 34.0 pg   MCHC 33.3  30.0 - 36.0 g/dL   RDW 11.9 (*) 14.7 - 82.9 %   Platelets 77 (*) 150 - 400 K/uL  PREPARE RBC (CROSSMATCH)     Status: None   Collection Time    09/06/13 12:30  PM       Result Value Range   Order Confirmation ORDER PROCESSED BY BLOOD BANK       Dg Chest Portable 1 View  09/05/2013   *RADIOLOGY REPORT*  Clinical Data: Left-sided chest pain  PORTABLE CHEST - 1 VIEW  Comparison: -12/21/12  Findings: The cardiac shadow is stable.  The lungs are well-aerated bilaterally without focal infiltrate.  Minimal right basilar atelectasis is noted.  No acute bony abnormality is seen.  IMPRESSION: Minimal right basilar atelectasis.   Original Report Authenticated By: Alcide Clever, M.D.    ROS:  As stated above in the HPI otherwise negative.  Blood pressure 108/48, pulse 69, temperature 98.1 F (36.7 C), temperature source Oral, resp. rate 20, height 5\' 9"  (1.753 m), weight 241 lb 2.9 oz (109.4 kg), SpO2 100.00%.    PE: Gen: NAD, Alert and Oriented HEENT:  [place]/AT, EOMI Neck: Supple, no LAD Lungs: CTA Bilaterally CV: RRR without M/G/R ABM: Soft, NTND, +BS Ext: No C/C/E  Assessment/Plan: 1) Severe anemia. 2) Melena and coffee-ground emesis. 3) Cecal polyp. 4) ETOH abuse.   With is current presentation and the history of his MW tear and antral ulcer, a repeat EGD is required.  If the examination is negative he will require a repeat colonoscopy.  If the source of bleeding is identified in the upper GI tract, he will require the repeat colonoscopy during this hospitalization.  The polyp was moderately-sized and it is considered an advanced adenoma.  Since his ability to follow up as an outpatient is highly questionable, it will be prudent to perform this procedure while he is in the hospital.  Plan: 1) EGD now. 2) Colonoscopy in the upcoming days pending his EGD findings.  Cy Bresee D 09/06/2013, 2:44 PM

## 2013-09-06 NOTE — Progress Notes (Signed)
[facility] PROGRESS NOTE  Joshua Waller BJY:[id number] DOB: 07/16/1951 DOA: 09/05/2013 PCP: [name], MD  Assessment/Plan: #1. GIB Likely UGIB. Patient presents with Mrs. 2 days and 6 days of melanotic stools. On admission hemoglobin was noted to be 3.7. Patient with no further hematemesis or melanotic stools today. Patient is status post 4 units packed red blood cells. Hemoglobin is pending. Patient does have a history of alcohol abuse prior history of gastric ulcers per endoscopy of 12/22/2012, and and colonoscopy of 12/23/2012 with internal hemorrhoids and questionable small bowel AVMs. Patient was to have a capsule endoscopy as outpatient however this was never done. Patient is status post 4 units packed red blood cells. H&H is pending. Keep n.p.o. Continue protonix drip. IV fluids. Supportive care. GI has been consulted spoke with Dr. Elnoria Howard and he will see the patient and patient will likely undergo upper endoscopy today. Continue serial CBCs.  #2 acute blood loss anemia Secondary to probable upper GI bleed. Hemoglobin on admission was 3.7. Patient is status post 4 units packed red blood cells. H&H is pending. GI has been consulted. See problem #1.  #3 history of alcohol abuse Patient states his last drink was 5 days prior to admission. Patient with no noted DTs. Will place on the Ativan withdrawal protocol. Place on thiamine and folic acid. Multivitamin. Follow.  #4 generalized weakness Likely secondary to symptomatic anemia. Patient is being transfused 4 units packed red blood cells. Follow. PT/OT.  #5 hyponatremia Likely secondary to hypovolemic hyponatremia in patient with upper GI bleed. IV fluids. Supportive care. Follow.  #6 prophylaxis PPI for GI prophylaxis. SCDs for DVT prophylaxis.  Code Status: Full Family Communication:updated patient at bedside Disposition Plan: Stay in SDU today   Consultants:  GI: Dr Elnoria Howard pending  Procedures:  CXR 09/05/13  4 units  packed red blood cells  Antibiotics:  None  HPI/Subjective: Patient denies any further hemetemesis today. No melanotic stools today. Wants to eat.  Objective: Filed Vitals:   09/06/13 0715  BP: 100/52  Pulse: 70  Temp: 98.5 F (36.9 C)  Resp: 21    Intake/Output Summary (Last 24 hours) at 09/06/13 0847 Last data filed at 09/06/13 0600  Gross per 24 hour  Intake 1317.91 ml  Output    300 ml  Net 1017.91 ml   Filed Weights   09/05/13 2158  Weight: 109.4 kg (241 lb 2.9 oz)    Exam:   General:  Nad. Dry mucous membranes.  Cardiovascular: rrr  Respiratory: ctab  Abdomen: soft/nt/nd/+bs  Musculoskeletal: no c/c/e  Data Reviewed: Basic Metabolic Panel:  Recent Labs Lab 09/05/13 1805  NA 127*  K 3.5  CL 96  CO2 22  GLUCOSE 192*  BUN 29*  CREATININE 0.70  CALCIUM 8.4   Liver Function Tests:  Recent Labs Lab 09/05/13 1805  AST 34  ALT 22  ALKPHOS 42  BILITOT 1.2  PROT 6.1  ALBUMIN 2.5*    Recent Labs Lab 09/05/13 1805  LIPASE 19   No results found for this basename: AMMONIA,  in the last 168 hours CBC:  Recent Labs Lab 09/05/13 1805  WBC 7.2  HGB 3.7*  HCT 11.7*  MCV 77.5*  PLT 86*   Cardiac Enzymes:  Recent Labs Lab 09/05/13 1805  TROPONINI <0.30   BNP (last 3 results)  Recent Labs  12/21/12 1705  PROBNP 206.0*   CBG: No results found for this basename: GLUCAP,  in the last 168 hours  Recent Results (from the past  240 hour(s))  MRSA PCR SCREENING     Status: None   Collection Time    09/05/13  9:51 PM      Result Value Range Status   MRSA by PCR NEGATIVE  NEGATIVE Final   Comment:            The GeneXpert MRSA Assay (FDA     approved for NASAL specimens     only), is one component of a     comprehensive MRSA colonization     surveillance program. It is not     intended to diagnose MRSA     infection nor to guide or     monitor treatment for     MRSA infections.     Studies: Dg Chest Portable 1  View  09/05/2013   *RADIOLOGY REPORT*  Clinical Data: Left-sided chest pain  PORTABLE CHEST - 1 VIEW  Comparison: -12/21/12  Findings: The cardiac shadow is stable.  The lungs are well-aerated bilaterally without focal infiltrate.  Minimal right basilar atelectasis is noted.  No acute bony abnormality is seen.  IMPRESSION: Minimal right basilar atelectasis.   Original Report Authenticated By: Alcide Clever, M.D.    Scheduled Meds: . folic acid  1 mg Oral Daily  . [START ON 09/07/2013] influenza vac split quadrivalent PF  0.5 mL Intramuscular Tomorrow-1000  . multivitamin with minerals  1 tablet Oral Daily  . [START ON 09/07/2013] pneumococcal 23 valent vaccine  0.5 mL Intramuscular Tomorrow-1000  . sodium chloride  3 mL Intravenous Q12H  . thiamine  100 mg Oral Daily   Or  . thiamine  100 mg Intravenous Daily   Continuous Infusions: . sodium chloride    . pantoprozole (PROTONIX) infusion 8 mg/hr (09/06/13 0418)    Principal Problem:   GIB (gastrointestinal bleeding) Active Problems:   ETOH abuse   Anemia associated with acute blood loss   Hematemesis/vomiting blood   Melena   Weakness generalized   Health examination of prisoner   Hyponatremia    Time spent: > 35 MINS    [facility]  [facility] Pager [phone]. If 7PM-7AM, please contact night-coverage at [email or web address], password [email or web address] 09/06/2013, 8:47 AM  LOS: 1 day

## 2013-09-06 NOTE — Plan of Care (Signed)
Problem: Phase II Progression Outcomes Goal: Tolerating diet Outcome: Progressing Clear liquids     

## 2013-09-07 ENCOUNTER — Inpatient Hospital Stay (HOSPITAL_COMMUNITY): Payer: Medicare Other

## 2013-09-07 DIAGNOSIS — F1010 Alcohol abuse, uncomplicated: Secondary | ICD-10-CM

## 2013-09-07 DIAGNOSIS — D126 Benign neoplasm of colon, unspecified: Secondary | ICD-10-CM

## 2013-09-07 DIAGNOSIS — I8500 Esophageal varices without bleeding: Secondary | ICD-10-CM | POA: Diagnosis present

## 2013-09-07 DIAGNOSIS — K2970 Gastritis, unspecified, without bleeding: Secondary | ICD-10-CM | POA: Diagnosis present

## 2013-09-07 DIAGNOSIS — K3189 Other diseases of stomach and duodenum: Secondary | ICD-10-CM | POA: Diagnosis present

## 2013-09-07 DIAGNOSIS — D696 Thrombocytopenia, unspecified: Secondary | ICD-10-CM | POA: Diagnosis present

## 2013-09-07 DIAGNOSIS — K922 Gastrointestinal hemorrhage, unspecified: Secondary | ICD-10-CM

## 2013-09-07 DIAGNOSIS — D62 Acute posthemorrhagic anemia: Secondary | ICD-10-CM

## 2013-09-07 LAB — TYPE AND SCREEN
Antibody Screen: NEGATIVE
Unit division: 0
Unit division: 0
Unit division: 0
Unit division: 0

## 2013-09-07 LAB — BASIC METABOLIC PANEL
Chloride: 104 mEq/L (ref 96–112)
GFR calc Af Amer: >90 mL/min (ref >90)
GFR calc non Af Amer: >90 mL/min (ref >90)
Glucose, Bld: 134 mg/dL — ABNORMAL HIGH (ref 70–99)
Potassium: 4 mEq/L (ref 3.5–5.1)
Sodium: 134 mEq/L — ABNORMAL LOW (ref 135–145)

## 2013-09-07 LAB — CBC
Hemoglobin: 8.5 g/dL — ABNORMAL LOW (ref 13.0–17.0)
MCH: 27.5 pg (ref 26.0–34.0)
MCHC: 33.5 g/dL (ref 30.0–36.0)
MCV: 82.1 fL (ref 78.0–100.0)
Platelets: 73 10*3/uL — ABNORMAL LOW (ref 150–400)
RBC: 3.1 MIL/uL — ABNORMAL LOW (ref 4.22–5.81)
RDW: 17.9 % — ABNORMAL HIGH (ref 11.5–15.5)

## 2013-09-07 LAB — HEPATITIS B SURFACE ANTIGEN: Hepatitis B Surface Ag: NEGATIVE

## 2013-09-07 LAB — AFP TUMOR MARKER: AFP-Tumor Marker: 18.2 ng/mL — ABNORMAL HIGH (ref 0.0–8.0)

## 2013-09-07 MED ORDER — SODIUM CHLORIDE 0.9 % IJ SOLN
10.0000 mL | Freq: Two times a day (BID) | INTRAMUSCULAR | Status: DC
  Administered 2013-09-07 – 2013-09-08 (×3): 10 mL

## 2013-09-07 MED ORDER — SODIUM CHLORIDE 0.9 % IJ SOLN
10.0000 mL | INTRAMUSCULAR | Status: DC | PRN
  Administered 2013-09-08: 20 mL

## 2013-09-07 NOTE — Progress Notes (Signed)
Peripherally Inserted Central Catheter/Midline Placement  The IV Nurse has discussed with the patient and/or persons authorized to consent for the patient, the purpose of this procedure and the potential benefits and risks involved with this procedure.  The benefits include less needle sticks, lab draws from the catheter and patient may be discharged home with the catheter.  Risks include, but not limited to, infection, bleeding, blood clot (thrombus formation), and puncture of an artery; nerve damage and irregular heat beat.  Alternatives to this procedure were also discussed.  PICC/Midline Placement Documentation        Ethelda Chick 09/07/2013, 12:07 PM

## 2013-09-07 NOTE — Progress Notes (Signed)
[facility] PROGRESS NOTE  Joshua Waller ZOX:[id number] DOB: [birth year] DOA: 09/05/2013 PCP: [name], MD  Assessment/Plan: #1. GIB ?? Etiology. Patient presented with hemetemesis x 2 days and 6 days of melanotic stools. On admission hemoglobin was noted to be 3.7. Patient with no further hematemesis or melanotic stools today. Patient is status post 6 units packed red blood cells. Hemoglobin was 8.6 last night, H/H is pending. Patient does have a history of alcohol abuse prior history of gastric ulcers per endoscopy of 12/22/2012, and and colonoscopy of 12/23/2012 with internal hemorrhoids and questionable small bowel AVMs. Patient was to have a capsule endoscopy as outpatient however this was never done. Patient is status post 6 units packed red blood cells. Patient s/p EGD 09/06/13 with findings of esophageal varices s/p banding, portal HTN gastropathy and gastritis. H&H is pending. Continue protonix drip. IV fluids, IV Rocephin. RUQ [place] pending. Patient for colonoscopy Monday for polypectomy. GI ff and appreciate input and rxcs.  #2 acute blood loss anemia Secondary to probable upper GI bleed. Hemoglobin on admission was 3.7. Patient is status post 6 units packed red blood cells. Hgb 8.6 last night. H/H pending. Patient has been seen by GI and s/p EGD yesterday. See problem #1.  #3 Esophageal varices/Portal HTN gastropathy S/p banding. Patient on IV Rocephin per GI. Will need repeat EGD with banding in 3-4 weeks.  #4 Colonic polyps Patient for colonoscopy Monday for polypectomy  #5 history of alcohol abuse Patient states his last drink was 6 days prior to admission. Patient with no noted DTs. Will place on the Ativan withdrawal protocol. Continue thiamine and folic acid. Multivitamin. Follow.  #6 generalized weakness Likely secondary to symptomatic anemia. Clinical improvement. Patient s/p 6 units packed red blood cells. Follow. PT/OT.  #7 hyponatremia Likely secondary to  hypovolemic hyponatremia in patient with upper GI bleed. IV fluids. BMET pending. Supportive care. Follow.  #8 Thrombocytopenia Chronic and secondary to ETOH abuse. Follow.  #9 prophylaxis PPI for GI prophylaxis. SCDs for DVT prophylaxis.  Code Status: Full Family Communication:updated patient at bedside Disposition Plan: Stay in SDU today   Consultants:  GI: Dr Elnoria Howard 09/06/13  Procedures:  CXR 09/05/13  6 units packed red blood cells  EGD 09/06/13  Antibiotics:  None  HPI/Subjective: Patient denies any further hemetemesis today. No melanotic stools today. Wants to eat.  Objective: Filed Vitals:   09/07/13 0800  BP:   Pulse:   Temp: 98.3 F (36.8 C)  Resp:     Intake/Output Summary (Last 24 hours) at 09/07/13 0857 Last data filed at 09/07/13 0800  Gross per 24 hour  Intake 2602.5 ml  Output   1350 ml  Net 1252.5 ml   Filed Weights   09/05/13 2158 09/07/13 0400  Weight: 109.4 kg (241 lb 2.9 oz) 107.9 kg (237 lb 14 oz)    Exam:   General:  Nad. Dry mucous membranes.  Cardiovascular: rrr  Respiratory: ctab  Abdomen: soft/nt/nd/+bs  Musculoskeletal: no c/c/e  Data Reviewed: Basic Metabolic Panel:  Recent Labs Lab 09/05/13 1805  NA 127*  K 3.5  CL 96  CO2 22  GLUCOSE 192*  BUN 29*  CREATININE 0.70  CALCIUM 8.4   Liver Function Tests:  Recent Labs Lab 09/05/13 1805  AST 34  ALT 22  ALKPHOS 42  BILITOT 1.2  PROT 6.1  ALBUMIN 2.5*    Recent Labs Lab 09/05/13 1805  LIPASE 19   No results found for this basename: AMMONIA,  in the last  168 hours CBC:  Recent Labs Lab 09/05/13 1805 09/06/13 1110 09/06/13 2346  WBC 7.2 6.0 4.7  HGB 3.7* 6.8* 8.6*  HCT 11.7* 20.4* 25.6*  MCV 77.5* 81.0 82.3  PLT 86* 77* 75*   Cardiac Enzymes:  Recent Labs Lab 09/05/13 1805  TROPONINI <0.30   BNP (last 3 results)  Recent Labs  12/21/12 1705  PROBNP 206.0*   CBG: No results found for this basename: GLUCAP,  in the last 168  hours  Recent Results (from the past 240 hour(s))  MRSA PCR SCREENING     Status: None   Collection Time    09/05/13  9:51 PM      Result Value Range Status   MRSA by PCR NEGATIVE  NEGATIVE Final   Comment:            The GeneXpert MRSA Assay (FDA     approved for NASAL specimens     only), is one component of a     comprehensive MRSA colonization     surveillance program. It is not     intended to diagnose MRSA     infection nor to guide or     monitor treatment for     MRSA infections.     Studies: Dg Chest Portable 1 View  09/05/2013   *RADIOLOGY REPORT*  Clinical Data: Left-sided chest pain  PORTABLE CHEST - 1 VIEW  Comparison: -12/21/12  Findings: The cardiac shadow is stable.  The lungs are well-aerated bilaterally without focal infiltrate.  Minimal right basilar atelectasis is noted.  No acute bony abnormality is seen.  IMPRESSION: Minimal right basilar atelectasis.   Original Report Authenticated By: Alcide Clever, M.D.    Scheduled Meds: . cefTRIAXone (ROCEPHIN)  IV  1 g Intravenous Q24H  . folic acid  1 mg Oral Daily  . influenza vac split quadrivalent PF  0.5 mL Intramuscular Tomorrow-1000  . multivitamin with minerals  1 tablet Oral Daily  . pneumococcal 23 valent vaccine  0.5 mL Intramuscular Tomorrow-1000  . sodium chloride  3 mL Intravenous Q12H  . thiamine  100 mg Oral Daily   Or  . thiamine  100 mg Intravenous Daily   Continuous Infusions: . sodium chloride 75 mL/hr (09/07/13 0800)    Principal Problem:   GIB (gastrointestinal bleeding) Active Problems:   ETOH abuse   Anemia associated with acute blood loss   Hematemesis/vomiting blood   Melena   Weakness generalized   Health examination of prisoner   Hyponatremia   Thrombocytopenia, unspecified   Esophageal varices   Unspecified gastritis and gastroduodenitis without mention of hemorrhage   Portal hypertensive gastropathy    Time spent: > 35 MINS    [facility]  [facility] Pager [phone]. If 7PM-7AM, please contact night-coverage at [email or web address], password [email or web address] 09/07/2013, 8:57 AM  LOS: 2 days

## 2013-09-07 NOTE — Progress Notes (Signed)
Patient ID: Joshua Waller, male   DOB: 07-26-1951, 62 y.o.   MRN: [id number] [facility] Progress Note  Subjective: Hungry, no c/o chest pain, no further hematemesis,says he feels better after blood transfusions. Did not know that he had cirrhosis...  Objective:  Vital signs in last 24 hours: Temp:  [97.6 F (36.4 C)-99.7 F (37.6 C)] 98.3 F (36.8 C) (09/13 0800) Pulse Rate:  [62-75] 75 (09/13 0800) Resp:  [14-24] 21 (09/13 0800) BP: (83-132)/(43-70) 127/60 mmHg (09/13 0700) SpO2:  [95 %-100 %] 98 % (09/13 0800) Weight:  [237 lb 14 oz (107.9 kg)] 237 lb 14 oz (107.9 kg) (09/13 0400) Last BM Date: 09/06/13 General:   Alert,  Well-developed,  WM   in NAD,officer at bedside Heart:  Regular rate and rhythm; no murmurs Pulm;clear Abdomen:  Soft, nontender and nondistended. Normal bowel sounds, without guarding, and without rebound.   Extremities:  Without edema. Neurologic:  Alert and  oriented x4;  grossly normal neurologically. Psych:  Alert and cooperative. Normal mood and affect.  Intake/Output from previous day: 09/12 0701 - 09/13 0700 In: 2527.5 [I.V.:2040; Blood:437.5; IV Piggyback:50] Out: 950 [Urine:950] Intake/Output this shift: Total I/O In: 150 [I.V.:150] Out: 400 [Urine:400]  Lab Results:  Recent Labs  09/05/13 1805 09/06/13 1110 09/06/13 2346  WBC 7.2 6.0 4.7  HGB 3.7* 6.8* 8.6*  HCT 11.7* 20.4* 25.6*  PLT 86* 77* 75*   BMET  Recent Labs  09/05/13 1805  NA 127*  K 3.5  CL 96  CO2 22  GLUCOSE 192*  BUN 29*  CREATININE 0.70  CALCIUM 8.4   LFT  Recent Labs  09/05/13 1805  PROT 6.1  ALBUMIN 2.5*  AST 34  ALT 22  ALKPHOS 42  BILITOT 1.2   PT/INR  Recent Labs  09/05/13 1805 09/06/13 1105  LABPROT 16.4* 15.4*  INR 1.36 1.25   Hepatitis Panel  Recent Labs  09/06/13 2346  HEPBSAG NEGATIVE  HCVAB NEGATIVE    Assessment / Plan: #1  62 yo WM with 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 Elnoria Howard    LOS: 2 days   Amy Monica Becton  09/07/2013, 10:16 AM   GI ATTENDING  Interval history and laboratories reviewed. Patient personally seen and examined. Agree with H&P as outlined above. Hemoglobin stable. Public relations account executive in room. Patient without complaints. Multiple questions answered to his satisfaction. He has been stable without evidence of recurrent bleeding. Plans, per Dr. Elnoria Howard, colonoscopy on Monday to address previously unresectable colonic adenoma.  Wilhemina Bonito. Eda Keys., M.D. [facility] Division of Gastroenterology

## 2013-09-08 DIAGNOSIS — E876 Hypokalemia: Secondary | ICD-10-CM

## 2013-09-08 DIAGNOSIS — R161 Splenomegaly, not elsewhere classified: Secondary | ICD-10-CM | POA: Diagnosis present

## 2013-09-08 DIAGNOSIS — K7460 Unspecified cirrhosis of liver: Secondary | ICD-10-CM | POA: Diagnosis present

## 2013-09-08 LAB — CBC
HCT: 23.6 % — ABNORMAL LOW (ref 39.0–52.0)
MCHC: 33.5 g/dL (ref 30.0–36.0)
Platelets: 68 10*3/uL — ABNORMAL LOW (ref 150–400)
RBC: 2.84 MIL/uL — ABNORMAL LOW (ref 4.22–5.81)
RDW: 18.1 % — ABNORMAL HIGH (ref 11.5–15.5)
RDW: 18.1 % — ABNORMAL HIGH (ref 11.5–15.5)
WBC: 3.8 10*3/uL — ABNORMAL LOW (ref 4.0–10.5)
WBC: 3.9 10*3/uL — ABNORMAL LOW (ref 4.0–10.5)

## 2013-09-08 LAB — BASIC METABOLIC PANEL
BUN: 6 mg/dL (ref 6–23)
Chloride: 101 mEq/L (ref 96–112)
Creatinine, Ser: 0.56 mg/dL (ref 0.50–1.35)
GFR calc Af Amer: >90 mL/min (ref >90)
GFR calc non Af Amer: >90 mL/min (ref >90)
Potassium: 3.6 mEq/L (ref 3.5–5.1)

## 2013-09-08 LAB — MAGNESIUM: Magnesium: 2.2 mg/dL (ref 1.5–2.5)

## 2013-09-08 MED ORDER — PEG-KCL-NACL-NASULF-NA ASC-C 100 G PO SOLR: 0.5000 | Freq: Once | ORAL | Status: DC

## 2013-09-08 MED ORDER — POTASSIUM CHLORIDE CRYS ER 20 MEQ PO TBCR
40.0000 meq | EXTENDED_RELEASE_TABLET | Freq: Once | ORAL | Status: AC
  Administered 2013-09-08: 40 meq via ORAL
  Filled 2013-09-08: qty 2

## 2013-09-08 MED ORDER — PEG-KCL-NACL-NASULF-NA ASC-C 100 G PO SOLR: 1.0000 | Freq: Once | ORAL | Status: DC

## 2013-09-08 MED ORDER — PEG-KCL-NACL-NASULF-NA ASC-C 100 G PO SOLR
0.5000 | Freq: Once | ORAL | Status: AC
  Administered 2013-09-08: 100 g via ORAL
  Filled 2013-09-08: qty 1

## 2013-09-08 MED ORDER — POTASSIUM CHLORIDE CRYS ER 20 MEQ PO TBCR
40.0000 meq | EXTENDED_RELEASE_TABLET | ORAL | Status: AC
  Administered 2013-09-08 (×2): 40 meq via ORAL
  Filled 2013-09-08 (×2): qty 2

## 2013-09-08 MED ORDER — POTASSIUM CHLORIDE IN NACL 40-0.9 MEQ/L-% IV SOLN
INTRAVENOUS | Status: DC
  Administered 2013-09-08: 06:00:00 via INTRAVENOUS
  Filled 2013-09-08: qty 1000

## 2013-09-08 MED ORDER — PANTOPRAZOLE SODIUM 40 MG PO TBEC
40.0000 mg | DELAYED_RELEASE_TABLET | Freq: Every day | ORAL | Status: DC
  Administered 2013-09-08 – 2013-09-11 (×4): 40 mg via ORAL
  Filled 2013-09-08 (×4): qty 1

## 2013-09-08 NOTE — Progress Notes (Signed)
[facility] PROGRESS NOTE  Joshua Waller ZOX:[id number] DOB: [birth year] DOA: 09/05/2013 PCP: [name], MD  Assessment/Plan: #1. GIB Likely secondary to variceal bleed, s/p banding. Patient presented with hemetemesis x 2 days and 6 days of melanotic stools. On admission hemoglobin was noted to be 3.7. Patient with no further hematemesis or melanotic stools today. Patient states dark stool but not as black on presentation. Patient is status post 6 units packed red blood cells. Hemoglobin now 7.9 from 8.6 last night. Patient does have a history of alcohol abuse prior history of gastric ulcers per endoscopy of 12/22/2012, and and colonoscopy of 12/23/2012 with internal hemorrhoids and questionable small bowel AVMs. Patient was to have a capsule endoscopy as outpatient however this was never done. Patient is status post 6 units packed red blood cells. Patient s/p EGD 09/06/13 with findings of esophageal varices s/p banding, portal HTN gastropathy and gastritis. H&H is pending. [place] c/w cirrhosis and splenomegaly. Protonix gtt d/c'd. Will place on oral protonix 40mg  daily. Continue  IV Rocephin. Patient for colonoscopy Monday for polypectomy. GI ff and appreciate input and rxcs.  #2 acute blood loss anemia Secondary to probable upper GI bleed. Hemoglobin on admission was 3.7. Patient is status post 6 units packed red blood cells. Hgb 7.9 from 8.6. Patient has been seen by GI and s/p EGD. See problem #1.  #3 Esophageal varices/Portal HTN gastropathy/Splenomegaly S/p banding. Patient on IV Rocephin per GI. Will need repeat EGD with banding in 3-4 weeks.?? Nadolol will defer to GI.  #4 Cirrhosis Per [place]. Likely ETOH induced. AFP elevated at 18.2. Hep Bs ab/ag neg, HCV Ab neg.??start lasix and aldactone and xifaxin. Will defer to GI.  #5 Colonic polyps Patient for colonoscopy tommorrow for polypectomy  #6 history of alcohol abuse Patient states his last drink was 6 days prior to admission.  Patient with no noted DTs. Continue Ativan withdrawal protocol. Continue thiamine and folic acid. Multivitamin. Follow.  #7 generalized weakness Likely secondary to symptomatic anemia. Clinical improvement. Patient s/p 6 units packed red blood cells. Follow. PT/OT.  #8 hyponatremia Likely secondary to hypovolemic hyponatremia in patient with upper GI bleed. NSL IVF. BMET in AM. Supportive care. Follow.  #9 Thrombocytopenia Chronic and secondary to ETOH abuse. No signs of bleeding. Follow.  #10 prophylaxis PPI for GI prophylaxis. SCDs for DVT prophylaxis.  Code Status: Full Family Communication:updated patient at bedside Disposition Plan: Transfer to telemetry.   Consultants:  GI: Dr Elnoria Howard 09/06/13  Procedures:  CXR 09/05/13  6 units packed red blood cells  EGD 09/06/13  [place] 09/07/13  Antibiotics:  None  HPI/Subjective: Patient denies any further hemetemesis today. Patient states had a BM however not as dark as on presentation.   Objective: Filed Vitals:   09/08/13 0400  BP: 120/56  Pulse: 74  Temp: 98.4 F (36.9 C)  Resp: 17    Intake/Output Summary (Last 24 hours) at 09/08/13 0816 Last data filed at 09/08/13 0700  Gross per 24 hour  Intake   2615 ml  Output      0 ml  Net   2615 ml   Filed Weights   09/05/13 2158 09/07/13 0400  Weight: 109.4 kg (241 lb 2.9 oz) 107.9 kg (237 lb 14 oz)    Exam:   General:  Nad. Dry mucous membranes.  Cardiovascular: rrr  Respiratory: ctab  Abdomen: soft/nt/nd/+bs  Musculoskeletal: no c/c/e  Data Reviewed: Basic Metabolic Panel:  Recent Labs Lab 09/05/13 1805 09/07/13 0819 09/08/13 0412  NA 127*  134* 129*  K 3.5 4.0 2.7*  CL 96 104 99  CO2 22 19 22   GLUCOSE 192* 134* 146*  BUN 29* 15 9  CREATININE 0.70 0.65 0.58  CALCIUM 8.4 7.7* 7.7*  MG  --   --  2.2   Liver Function Tests:  Recent Labs Lab 09/05/13 1805  AST 34  ALT 22  ALKPHOS 42  BILITOT 1.2  PROT 6.1  ALBUMIN 2.5*    Recent  Labs Lab 09/05/13 1805  LIPASE 19   No results found for this basename: AMMONIA,  in the last 168 hours CBC:  Recent Labs Lab 09/06/13 1110 09/06/13 2346 09/07/13 1211 09/07/13 1549 09/08/13 0412  WBC 6.0 4.7 5.0 5.2 3.8*  HGB 6.8* 8.6* 8.6* 8.5* 7.9*  HCT 20.4* 25.6* 25.7* 25.5* 23.2*  MCV 81.0 82.3 82.1 82.3 81.7  PLT 77* 75* 73* 80* 68*   Cardiac Enzymes:  Recent Labs Lab 09/05/13 1805  TROPONINI <0.30   BNP (last 3 results)  Recent Labs  12/21/12 1705  PROBNP 206.0*   CBG: No results found for this basename: GLUCAP,  in the last 168 hours  Recent Results (from the past 240 hour(s))  MRSA PCR SCREENING     Status: None   Collection Time    09/05/13  9:51 PM      Result Value Range Status   MRSA by PCR NEGATIVE  NEGATIVE Final   Comment:            The GeneXpert MRSA Assay (FDA     approved for NASAL specimens     only), is one component of a     comprehensive MRSA colonization     surveillance program. It is not     intended to diagnose MRSA     infection nor to guide or     monitor treatment for     MRSA infections.     Studies: US Abdomen Complete  09/07/2013   CLINICAL DATA:  Alcohol abuse, evaluate for cirrhosis or HCC  EXAM: ABDOMEN ULTRASOUND  COMPARISON:  None.  FINDINGS: Gallbladder  Gallbladder sludge. No gallbladder wall thickening or pericholecystic fluid. Negative sonographic Murphy's sign.  Common bile duct  Diameter: 5 mm.  Liver  Coarse, echogenic hepatic echotexture with nodular hepatic contour, suggesting cirrhosis. No gross focal hepatic lesion is seen.  IVC  No abnormality visualized.  Pancreas  Poorly visualized due to overlying bowel gas.  Spleen  Enlarged, measuring 16.9 cm in maximal dimension (calculated volume 1134 mL).  Right Kidney  Length: 12.5 cm. Echogenicity within normal limits. No mass or hydronephrosis visualized.  Left Kidney  Length: 12.5 cm. Echogenicity within normal limits. No mass or hydronephrosis visualized.   Abdominal aorta  No aneurysm visualized.  IMPRESSION: Cirrhosis.  No focal hepatic lesion is evident by ultrasound. If there is high clinical suspicion, consider MRI.  Splenomegaly.   Electronically Signed   By: Charline Bills M.D.   On: 09/07/2013 11:02    Scheduled Meds: . cefTRIAXone (ROCEPHIN)  IV  1 g Intravenous Q24H  . folic acid  1 mg Oral Daily  . multivitamin with minerals  1 tablet Oral Daily  . potassium chloride  40 mEq Oral Q4H  . potassium chloride  40 mEq Oral Once  . sodium chloride  10-40 mL Intracatheter Q12H  . sodium chloride  3 mL Intravenous Q12H  . thiamine  100 mg Oral Daily   Continuous Infusions: . 0.9 % NaCl with KCl 40 mEq /  L 75 mL/hr at 09/08/13 0600    Principal Problem:   GIB (gastrointestinal bleeding) Active Problems:   ETOH abuse   Anemia associated with acute blood loss   Hematemesis/vomiting blood   Melena   Weakness generalized   Health examination of prisoner   Hyponatremia   Thrombocytopenia, unspecified   Esophageal varices   Unspecified gastritis and gastroduodenitis without mention of hemorrhage   Portal hypertensive gastropathy   Cirrhosis of liver   Splenomegaly   Hypokalemia    Time spent: > 35 MINS    [facility]  [facility] Pager [phone]. If 7PM-7AM, please contact night-coverage at [email or web address], password [facility] 09/08/2013, 8:16 AM  LOS: 3 days

## 2013-09-08 NOTE — Progress Notes (Signed)
Patient ID: Joshua Waller, male   DOB: 04-29-1951, 63 y.o.   MRN: [id number] [facility] Progress Note  Subjective: Feels Ok, no specific c/o , stools not as dark. S/p 6 units prbcs- hgb 7.9 Objective:  Vital signs in last 24 hours: Temp:  [97.9 F (36.6 C)-98.7 F (37.1 C)] 97.9 F (36.6 C) (09/14 0800) Pulse Rate:  [74-78] 74 (09/14 0400) Resp:  [17-24] 17 (09/14 0400) BP: (112-146)/(54-69) 120/56 mmHg (09/14 0400) SpO2:  [95 %-100 %] 95 % (09/14 0400) Last BM Date: 09/07/13 General:   Alert,  Well-developed,    in NAD,officer at bedside Heart:  Regular rate and rhythm; no murmurs Pulm;clear Abdomen:  Soft, nontender and nondistended,large, no fluid wave. Normal bowel sounds, without guarding, and without rebound.   Extremities:  Without edema. Neurologic:  Alert and  oriented x4;  grossly normal neurologically, no asterixis Psych:  Alert and cooperative. Normal mood and affect.  Intake/Output from previous day: 09/13 0701 - 09/14 0700 In: 2690 [P.O.:840; I.V.:1800; IV Piggyback:50] Out: 400 [Urine:400] Intake/Output this shift:    Lab Results:  Recent Labs  09/07/13 1211 09/07/13 1549 09/08/13 0412  WBC 5.0 5.2 3.8*  HGB 8.6* 8.5* 7.9*  HCT 25.7* 25.5* 23.2*  PLT 73* 80* 68*   BMET  Recent Labs  09/05/13 1805 09/07/13 0819 09/08/13 0412  NA 127* 134* 129*  K 3.5 4.0 2.7*  CL 96 104 99  CO2 22 19 22   GLUCOSE 192* 134* 146*  BUN 29* 15 9  CREATININE 0.70 0.65 0.58  CALCIUM 8.4 7.7* 7.7*   LFT  Recent Labs  09/05/13 1805  PROT 6.1  ALBUMIN 2.5*  AST 34  ALT 22  ALKPHOS 42  BILITOT 1.2   PT/INR  Recent Labs  09/05/13 1805 09/06/13 1105  LABPROT 16.4* 15.4*  INR 1.36 1.25   Hepatitis Panel  Recent Labs  09/06/13 2346  HEPBSAG NEGATIVE  HCVAB NEGATIVE    Assessment / Plan: #1  63 yo male with ETOH induced liver disease,decompensated cirrhosis with variceal bleed Stable s/p EGD with banding #2 cecal adenoma on colon  11/2012- needs follow up colonoscopy with polypectomy Will schedule for tomorrow with Dr. Eliot Ford today #3  portall atHTN- start Nadolol- #4 hypokalemia- replacing #5 mild hyponatremia #6 profound anemia- secondary to blood loss- stable s/p 6 units Principal Problem:   GIB (gastrointestinal bleeding) Active Problems:   ETOH abuse   Anemia associated with acute blood loss   Hematemesis/vomiting blood   Melena   Weakness generalized   Health examination of prisoner   Hyponatremia   Thrombocytopenia, unspecified   Esophageal varices   Unspecified gastritis and gastroduodenitis without mention of hemorrhage   Portal hypertensive gastropathy   Cirrhosis of liver   Splenomegaly   Hypokalemia     LOS: 3 days   Amy Esterwood  09/08/2013, 9:22 AM   GI ATTENDING  Interval history and laboratories reviewed. Patient seen and examined. Agree with H&P as outlined above. Stable. Plans for colonoscopy tomorrow with Dr. Elnoria Howard. Orders written.  Wilhemina Bonito. Eda Keys., M.D. [facility] Division of Gastroenterology

## 2013-09-08 NOTE — Progress Notes (Signed)
Patient educated about the signs and symptoms of anemia, such as shob and tachycardia.  Patient verbalized understanding.

## 2013-09-08 NOTE — Progress Notes (Signed)
CRITICAL VALUE ALERT  Critical value received:  K 2.7  Date of notification:  09/08/13  Time of notification:  0430  Critical value read back: yes  Nurse who received alert:  Allyne Gee, RN   MD notified (1st page):  Dr. Elisabeth Pigeon  Time of first page:  0430  MD notified (2nd page): Dr. Sung Amabile  Time of second page:  Responding MD:  Dr. Sung Amabile  Time MD responded:  [phone]

## 2013-09-09 ENCOUNTER — Encounter (HOSPITAL_COMMUNITY): Payer: Self-pay | Admitting: Gastroenterology

## 2013-09-09 LAB — CBC
HCT: 24 % — ABNORMAL LOW (ref 39.0–52.0)
Hemoglobin: 7.8 g/dL — ABNORMAL LOW (ref 13.0–17.0)
MCHC: 32.5 g/dL (ref 30.0–36.0)
MCV: 82.8 fL (ref 78.0–100.0)
RDW: 18.2 % — ABNORMAL HIGH (ref 11.5–15.5)

## 2013-09-09 LAB — BASIC METABOLIC PANEL
Calcium: 7.7 mg/dL — ABNORMAL LOW (ref 8.4–10.5)
Creatinine, Ser: 0.58 mg/dL (ref 0.50–1.35)
GFR calc non Af Amer: >90 mL/min (ref >90)
Sodium: 129 mEq/L — ABNORMAL LOW (ref 135–145)

## 2013-09-09 LAB — COMPREHENSIVE METABOLIC PANEL
ALT: 32 U/L (ref 0–53)
Albumin: 2.4 g/dL — ABNORMAL LOW (ref 3.5–5.2)
Alkaline Phosphatase: 59 U/L (ref 39–117)
Calcium: 7.7 mg/dL — ABNORMAL LOW (ref 8.4–10.5)
GFR calc Af Amer: >90 mL/min (ref >90)
Potassium: 3.2 mEq/L — ABNORMAL LOW (ref 3.5–5.1)
Sodium: 133 mEq/L — ABNORMAL LOW (ref 135–145)
Total Protein: 5.8 g/dL — ABNORMAL LOW (ref 6.0–8.3)

## 2013-09-09 MED ORDER — POLYETHYLENE GLYCOL 3350 17 GM/SCOOP PO POWD
1.0000 | Freq: Once | ORAL | Status: AC
  Administered 2013-09-09: 18:00:00 1 via ORAL
  Filled 2013-09-09: qty 255

## 2013-09-09 MED ORDER — POTASSIUM CHLORIDE 10 MEQ/100ML IV SOLN
10.0000 meq | INTRAVENOUS | Status: AC
  Administered 2013-09-09 (×4): 10 meq via INTRAVENOUS
  Filled 2013-09-09 (×4): qty 100

## 2013-09-09 NOTE — Progress Notes (Signed)
Subjective: Feeling much better.  No reports of any bleeding.  Objective: Vital signs in last 24 hours: Temp:  [97.4 F (36.3 C)-98.2 F (36.8 C)] 98.2 F (36.8 C) (09/15 0645) Pulse Rate:  [75-82] 79 (09/15 0645) Resp:  [19-33] 20 (09/15 0645) BP: (114-146)/(66-75) 114/66 mmHg (09/15 0645) SpO2:  [99 %-100 %] 99 % (09/15 0645) Weight:  [258 lb 2.5 oz (117.1 kg)] 258 lb 2.5 oz (117.1 kg) (09/14 1029) Last BM Date: 09/08/13  Intake/Output from previous day: 09/14 0701 - 09/15 0700 In: 96.7 [I.V.:96.7] Out: 300 [Urine:300] Intake/Output this shift:    General appearance: alert and no distress GI: soft, non-tender; bowel sounds normal; no masses,  no organomegaly  Lab Results:  Recent Labs  09/08/13 0412 09/08/13 1705 09/09/13 0647  WBC 3.8* 3.9* 3.3*  HGB 7.9* 7.9* 7.8*  HCT 23.2* 23.6* 24.0*  PLT 68* 69* 67*   BMET  Recent Labs  09/08/13 0412 09/08/13 1705 09/09/13 0647  NA 129* 128* 133*  K 2.7* 3.6 3.2*  CL 99 101 103  CO2 22 22 20   GLUCOSE 146* 136* 144*  BUN 9 6 5*  CREATININE 0.58 0.56 0.56  CALCIUM 7.7* 7.4* 7.7*   LFT  Recent Labs  09/09/13 0647  PROT 5.8*  ALBUMIN 2.4*  AST 68*  ALT 32  ALKPHOS 59  BILITOT 1.1   PT/INR  Recent Labs  09/06/13 1105  LABPROT 15.4*  INR 1.25   Hepatitis Panel  Recent Labs  09/06/13 2346  HEPBSAG NEGATIVE  HCVAB NEGATIVE   C-Diff No results found for this basename: CDIFFTOX,  in the last 72 hours Fecal Lactopherrin No results found for this basename: FECLLACTOFRN,  in the last 72 hours  Studies/Results: US Abdomen Complete  09/07/2013   CLINICAL DATA:  Alcohol abuse, evaluate for cirrhosis or HCC  EXAM: ABDOMEN ULTRASOUND  COMPARISON:  None.  FINDINGS: Gallbladder  Gallbladder sludge. No gallbladder wall thickening or pericholecystic fluid. Negative sonographic Murphy's sign.  Common bile duct  Diameter: 5 mm.  Liver  Coarse, echogenic hepatic echotexture with nodular hepatic contour,  suggesting cirrhosis. No gross focal hepatic lesion is seen.  IVC  No abnormality visualized.  Pancreas  Poorly visualized due to overlying bowel gas.  Spleen  Enlarged, measuring 16.9 cm in maximal dimension (calculated volume 1134 mL).  Right Kidney  Length: 12.5 cm. Echogenicity within normal limits. No mass or hydronephrosis visualized.  Left Kidney  Length: 12.5 cm. Echogenicity within normal limits. No mass or hydronephrosis visualized.  Abdominal aorta  No aneurysm visualized.  IMPRESSION: Cirrhosis.  No focal hepatic lesion is evident by ultrasound. If there is high clinical suspicion, consider MRI.  Splenomegaly.   Electronically Signed   By: Charline Bills M.D.   On: 09/07/2013 11:02    Medications:  Scheduled: . cefTRIAXone (ROCEPHIN)  IV  1 g Intravenous Q24H  . folic acid  1 mg Oral Daily  . multivitamin with minerals  1 tablet Oral Daily  . pantoprazole  40 mg Oral Daily  . sodium chloride  10-40 mL Intracatheter Q12H  . sodium chloride  3 mL Intravenous Q12H  . thiamine  100 mg Oral Daily   Continuous:   Assessment/Plan: 1) ETOH cirrhosis. 2) Cecal polyp.   The patient appears to be stable from the cirrhosis standpoint.  No clear evidence of bleeding.  He has a cecal polyp and this will be removed tomorrow.  Plan: 1) Colonoscopy tomorrow. 2) Continue with ceftriaxone. 3) Follow HGB and transfuse  as necessary.   LOS: 4 days   Adi Doro D 09/09/2013, 7:37 AM

## 2013-09-09 NOTE — Progress Notes (Signed)
[facility] PROGRESS NOTE  Joshua Waller ZOX:[id number] DOB: 12-Sep-1951 DOA: 09/05/2013 PCP: [name], MD  Assessment/Plan: #1. GIB Likely secondary to variceal bleed, s/p banding. Patient presented with hemetemesis x 2 days and 6 days of melanotic stools. On admission hemoglobin was noted to be 3.7. Patient with no further hematemesis or melanotic stools today. Patient states dark stool but not as black on presentation. Patient is status post 6 units packed red blood cells. Hemoglobin now 7.9 from 8.6 last night. Patient does have a history of alcohol abuse prior history of gastric ulcers per endoscopy of 12/22/2012, and and colonoscopy of 12/23/2012 with internal hemorrhoids and questionable small bowel AVMs. Patient was to have a capsule endoscopy as outpatient however this was never done. Patient is status post 6 units packed red blood cells. Patient s/p EGD 09/06/13 with findings of esophageal varices s/p banding, portal HTN gastropathy and gastritis. H&H is pending. [place] c/w cirrhosis and splenomegaly. Protonix gtt d/c'd. Continue oral protonix 40mg  daily. Continue  IV Rocephin. Patient for colonoscopy tommorrow for polypectomy. GI ff and appreciate input and rxcs.  #2 acute blood loss anemia Secondary to probable upper GI bleed. Hemoglobin on admission was 3.7. Patient is status post 6 units packed red blood cells. Hgb 7.8 from 8.6. Patient has been seen by GI and s/p EGD. See problem #1.  #3 Esophageal varices/Portal HTN gastropathy/Splenomegaly S/p banding. Patient on IV Rocephin per GI. Will need repeat EGD with banding in 3-4 weeks.?? Will defer start of nadolol to GI.  #4 Cirrhosis Per [place]. Likely ETOH induced. AFP elevated at 18.2. Hep Bs ab/ag neg, HCV Ab neg.??start lasix and aldactone and xifaxin. Will defer to GI.  #5 Colonic polyps Patient for colonoscopy tommorrow for polypectomy  #6 history of alcohol abuse Patient states his last drink was 6 days prior to  admission. Patient with no noted DTs. Continue Ativan withdrawal protocol. Continue thiamine and folic acid. Multivitamin. Follow.  #7 generalized weakness Likely secondary to symptomatic anemia. Clinical improvement. Patient s/p 6 units packed red blood cells. Follow. PT/OT.  #8 hyponatremia Likely secondary to hypovolemic hyponatremia in patient with upper GI bleed. NSL IVF. BMET in AM. Supportive care. Follow.  #9 Thrombocytopenia Chronic and secondary to ETOH abuse. No signs of bleeding. Follow.  #10 prophylaxis PPI for GI prophylaxis. SCDs for DVT prophylaxis.  Code Status: Full Family Communication: updated patient at bedside Disposition Plan: Back to correctional facility when medically stable.   Consultants:  GI: Dr Elnoria Howard 09/06/13  Procedures:  CXR 09/05/13  6 units packed red blood cells  EGD 09/06/13  [place] 09/07/13  Antibiotics:  None  HPI/Subjective: Patient denies any further hemetemesis today. Patient states had a BM however not as dark.  Objective: Filed Vitals:   09/09/13 0645  BP: 114/66  Pulse: 79  Temp: 98.2 F (36.8 C)  Resp: 20    Intake/Output Summary (Last 24 hours) at 09/09/13 1435 Last data filed at 09/09/13 1240  Gross per 24 hour  Intake 1176.67 ml  Output      0 ml  Net 1176.67 ml   Filed Weights   09/05/13 2158 09/07/13 0400 09/08/13 1029  Weight: 109.4 kg (241 lb 2.9 oz) 107.9 kg (237 lb 14 oz) 117.1 kg (258 lb 2.5 oz)    Exam:   General:  Nad. Dry mucous membranes.  Cardiovascular: rrr  Respiratory: ctab  Abdomen: soft/nt/nd/+bs  Musculoskeletal: no c/c/e  Data Reviewed: Basic Metabolic Panel:  Recent Labs Lab 09/05/13 1805 09/07/13 8119  09/08/13 0412 09/08/13 1705 09/09/13 0647  NA 127* 134* 129* 128* 133*  K 3.5 4.0 2.7* 3.6 3.2*  CL 96 104 99 101 103  CO2 22 19 22 22 20   GLUCOSE 192* 134* 146* 136* 144*  BUN 29* 15 9 6  5*  CREATININE 0.70 0.65 0.58 0.56 0.56  CALCIUM 8.4 7.7* 7.7* 7.4* 7.7*  MG  --    --  2.2  --   --    Liver Function Tests:  Recent Labs Lab 09/05/13 1805 09/09/13 0647  AST 34 68*  ALT 22 32  ALKPHOS 42 59  BILITOT 1.2 1.1  PROT 6.1 5.8*  ALBUMIN 2.5* 2.4*    Recent Labs Lab 09/05/13 1805  LIPASE 19   No results found for this basename: AMMONIA,  in the last 168 hours CBC:  Recent Labs Lab 09/07/13 1211 09/07/13 1549 09/08/13 0412 09/08/13 1705 09/09/13 0647  WBC 5.0 5.2 3.8* 3.9* 3.3*  HGB 8.6* 8.5* 7.9* 7.9* 7.8*  HCT 25.7* 25.5* 23.2* 23.6* 24.0*  MCV 82.1 82.3 81.7 82.2 82.8  PLT 73* 80* 68* 69* 67*   Cardiac Enzymes:  Recent Labs Lab 09/05/13 1805  TROPONINI <0.30   BNP (last 3 results)  Recent Labs  12/21/12 1705  PROBNP 206.0*   CBG: No results found for this basename: GLUCAP,  in the last 168 hours  Recent Results (from the past 240 hour(s))  MRSA PCR SCREENING     Status: None   Collection Time    09/05/13  9:51 PM      Result Value Range Status   MRSA by PCR NEGATIVE  NEGATIVE Final   Comment:            The GeneXpert MRSA Assay (FDA     approved for NASAL specimens     only), is one component of a     comprehensive MRSA colonization     surveillance program. It is not     intended to diagnose MRSA     infection nor to guide or     monitor treatment for     MRSA infections.     Studies: No results found.  Scheduled Meds: . cefTRIAXone (ROCEPHIN)  IV  1 g Intravenous Q24H  . folic acid  1 mg Oral Daily  . multivitamin with minerals  1 tablet Oral Daily  . pantoprazole  40 mg Oral Daily  . polyethylene glycol powder  1 Container Oral Once  . sodium chloride  10-40 mL Intracatheter Q12H  . thiamine  100 mg Oral Daily   Continuous Infusions:    Principal Problem:   GIB (gastrointestinal bleeding) Active Problems:   ETOH abuse   Anemia associated with acute blood loss   Hematemesis/vomiting blood   Melena   Weakness generalized   Health examination of prisoner   Hyponatremia   Thrombocytopenia,  unspecified   Esophageal varices   Unspecified gastritis and gastroduodenitis without mention of hemorrhage   Portal hypertensive gastropathy   Cirrhosis of liver   Splenomegaly   Hypokalemia    Time spent: > 35 MINS    [facility]  [facility] Pager [phone]. If 7PM-7AM, please contact night-coverage at [email or web address], password [facility] 09/09/2013, 2:35 PM  LOS: 4 days

## 2013-09-10 ENCOUNTER — Encounter (HOSPITAL_COMMUNITY): Admission: EM | Payer: Self-pay | Source: Home / Self Care | Attending: Internal Medicine

## 2013-09-10 ENCOUNTER — Encounter (HOSPITAL_COMMUNITY): Payer: Self-pay | Admitting: *Deleted

## 2013-09-10 HISTORY — PX: COLONOSCOPY: SHX5424

## 2013-09-10 LAB — BASIC METABOLIC PANEL
BUN: 3 mg/dL — ABNORMAL LOW (ref 6–23)
Chloride: 105 mEq/L (ref 96–112)
Creatinine, Ser: 0.54 mg/dL (ref 0.50–1.35)
GFR calc Af Amer: >90 mL/min (ref >90)
Glucose, Bld: 151 mg/dL — ABNORMAL HIGH (ref 70–99)
Potassium: 3.3 mEq/L — ABNORMAL LOW (ref 3.5–5.1)

## 2013-09-10 LAB — CBC
HCT: 24 % — ABNORMAL LOW (ref 39.0–52.0)
Hemoglobin: 7.8 g/dL — ABNORMAL LOW (ref 13.0–17.0)
MCH: 27.1 pg (ref 26.0–34.0)
MCHC: 32.5 g/dL (ref 30.0–36.0)
RDW: 18.2 % — ABNORMAL HIGH (ref 11.5–15.5)

## 2013-09-10 SURGERY — COLONOSCOPY
Anesthesia: Moderate Sedation

## 2013-09-10 MED ORDER — FENTANYL CITRATE 0.05 MG/ML IJ SOLN
INTRAMUSCULAR | Status: AC
Start: 2013-09-10 — End: 2013-09-10
  Filled 2013-09-10: qty 4

## 2013-09-10 MED ORDER — FENTANYL CITRATE 0.05 MG/ML IJ SOLN
INTRAMUSCULAR | Status: DC | PRN
  Administered 2013-09-10 (×3): 25 ug via INTRAVENOUS

## 2013-09-10 MED ORDER — DIPHENHYDRAMINE HCL 50 MG/ML IJ SOLN
INTRAMUSCULAR | Status: AC
  Filled 2013-09-10: qty 1

## 2013-09-10 MED ORDER — POTASSIUM CHLORIDE CRYS ER 20 MEQ PO TBCR
40.0000 meq | EXTENDED_RELEASE_TABLET | ORAL | Status: AC
  Administered 2013-09-10: 40 meq via ORAL
  Filled 2013-09-10 (×2): qty 2

## 2013-09-10 MED ORDER — MIDAZOLAM HCL 5 MG/5ML IJ SOLN
INTRAMUSCULAR | Status: DC | PRN
  Administered 2013-09-10 (×3): 2 mg via INTRAVENOUS

## 2013-09-10 MED ORDER — EPINEPHRINE HCL 1 MG/ML IJ SOLN
INTRAMUSCULAR | Status: DC | PRN
  Administered 2013-09-10: 14:00:00 1 mL

## 2013-09-10 MED ORDER — MIDAZOLAM HCL 10 MG/2ML IJ SOLN
INTRAMUSCULAR | Status: AC
  Filled 2013-09-10: qty 4

## 2013-09-10 MED ORDER — EPINEPHRINE HCL 0.1 MG/ML IJ SOSY
PREFILLED_SYRINGE | INTRAMUSCULAR | Status: AC
  Filled 2013-09-10: qty 10

## 2013-09-10 NOTE — H&P (View-Only) (Signed)
[facility] PROGRESS NOTE  Dhyan XXXMarkey MRN:[id number] DOB: 04/30/1951 DOA: 09/05/2013 PCP: Wilson, Amelia P, MD  Assessment/Plan: #1. GIB Likely secondary to variceal bleed, s/p banding. Patient presented with hemetemesis x 2 days and 6 days of melanotic stools. On admission hemoglobin was noted to be 3.7. Patient with no further hematemesis or melanotic stools. Patient is status post 6 units packed red blood cells. Hemoglobin now 7.8 and stable. Patient does have a history of alcohol abuse prior history of gastric ulcers per endoscopy of 12/22/2012, and and colonoscopy of 12/23/2012 with internal hemorrhoids and questionable small bowel AVMs. Patient was to have a capsule endoscopy as outpatient however this was never done. Patient is status post 6 units packed red blood cells. Patient s/p EGD 09/06/13 with findings of esophageal varices s/p banding, portal HTN gastropathy and gastritis. H&H is pending. US c/w cirrhosis and splenomegaly. Continue oral protonix 40mg daily. Continue  IV Rocephin. Patient for colonoscopy today for polypectomy. GI ff and appreciate input and rxcs.  #2 acute blood loss anemia Secondary to probable upper GI bleed. Hemoglobin on admission was 3.7. Patient is status post 6 units packed red blood cells. Hgb 7.8 from 8.6. Patient has been seen by GI and s/p EGD. See problem #1.  #3 Esophageal varices/Portal HTN gastropathy/Splenomegaly S/p banding. Patient on IV Rocephin per GI. Will need repeat EGD with banding in 3-4 weeks.?? Will defer start of nadolol to GI.  #4 Cirrhosis Per US. Likely ETOH induced. AFP elevated at 18.2. Hep Bs ab/ag neg, HCV Ab neg.??start lasix and aldactone and xifaxin. Will defer to GI.  #5 Colonic polyps Patient for colonoscopy today for polypectomy  #6 history of alcohol abuse Patient states his last drink was 6 days prior to admission. Patient with no noted DTs. Continue Ativan withdrawal protocol. Continue thiamine and folic acid.  Multivitamin. Follow.  #7 generalized weakness Likely secondary to symptomatic anemia. Clinical improvement. Patient s/p 6 units packed red blood cells. Follow. PT/OT.  #8 hyponatremia Likely secondary to hypovolemic hyponatremia in patient with upper GI bleed. NSL IVF. BMET in AM. Supportive care. Follow.  #9 Thrombocytopenia Chronic and secondary to ETOH abuse. No signs of bleeding. Follow.  #10 prophylaxis PPI for GI prophylaxis. SCDs for DVT prophylaxis.  Code Status: Full Family Communication: updated patient at bedside Disposition Plan: Back to correctional facility when medically stable.   Consultants:  GI: Dr Hung 09/06/13  Procedures:  CXR 09/05/13  6 units packed red blood cells  EGD 09/06/13  US 09/07/13  Antibiotics:  None  HPI/Subjective: Patient denies any further hemetemesis today. Patient with  No melanotic stools.  Objective: Filed Vitals:   09/10/13 0621  BP: 131/62  Pulse: 73  Temp: 98.5 F (36.9 C)  Resp: 18    Intake/Output Summary (Last 24 hours) at 09/10/13 1154 Last data filed at 09/09/13 1846  Gross per 24 hour  Intake    700 ml  Output      0 ml  Net    700 ml   Filed Weights   09/05/13 2158 09/07/13 0400 09/08/13 1029  Weight: 109.4 kg (241 lb 2.9 oz) 107.9 kg (237 lb 14 oz) 117.1 kg (258 lb 2.5 oz)    Exam:   General:  Nad. Dry mucous membranes.  Cardiovascular: rrr  Respiratory: ctab  Abdomen: soft/nt/nd/+bs  Musculoskeletal: no c/c/e  Data Reviewed: Basic Metabolic Panel:  Recent Labs Lab 09/07/13 0819 09/08/13 0412 09/08/13 1705 09/09/13 0647 09/10/13 0515  NA 134* 129* 128* 133* 133*    K 4.0 2.7* 3.6 3.2* 3.3*  CL 104 99 101 103 105  CO2 19 22 22 20 20  GLUCOSE 134* 146* 136* 144* 151*  BUN 15 9 6 5* 3*  CREATININE 0.65 0.58 0.56 0.56 0.54  CALCIUM 7.7* 7.7* 7.4* 7.7* 7.8*  MG  --  2.2  --   --   --    Liver Function Tests:  Recent Labs Lab 09/05/13 1805 09/09/13 0647  AST 34 68*  ALT 22  32  ALKPHOS 42 59  BILITOT 1.2 1.1  PROT 6.1 5.8*  ALBUMIN 2.5* 2.4*    Recent Labs Lab 09/05/13 1805  LIPASE 19   No results found for this basename: AMMONIA,  in the last 168 hours CBC:  Recent Labs Lab 09/07/13 1549 09/08/13 0412 09/08/13 1705 09/09/13 0647 09/10/13 0515  WBC 5.2 3.8* 3.9* 3.3* 3.4*  HGB 8.5* 7.9* 7.9* 7.8* 7.8*  HCT 25.5* 23.2* 23.6* 24.0* 24.0*  MCV 82.3 81.7 82.2 82.8 83.3  PLT 80* 68* 69* 67* 79*   Cardiac Enzymes:  Recent Labs Lab 09/05/13 1805  TROPONINI <0.30   BNP (last 3 results)  Recent Labs  12/21/12 1705  PROBNP 206.0*   CBG: No results found for this basename: GLUCAP,  in the last 168 hours  Recent Results (from the past 240 hour(s))  MRSA PCR SCREENING     Status: None   Collection Time    09/05/13  9:51 PM      Result Value Range Status   MRSA by PCR NEGATIVE  NEGATIVE Final   Comment:            The GeneXpert MRSA Assay (FDA     approved for NASAL specimens     only), is one component of a     comprehensive MRSA colonization     surveillance program. It is not     intended to diagnose MRSA     infection nor to guide or     monitor treatment for     MRSA infections.     Studies: No results found.  Scheduled Meds: . cefTRIAXone (ROCEPHIN)  IV  1 g Intravenous Q24H  . folic acid  1 mg Oral Daily  . multivitamin with minerals  1 tablet Oral Daily  . pantoprazole  40 mg Oral Daily  . potassium chloride  40 mEq Oral Q4H  . sodium chloride  10-40 mL Intracatheter Q12H  . thiamine  100 mg Oral Daily   Continuous Infusions:    Principal Problem:   GIB (gastrointestinal bleeding) Active Problems:   ETOH abuse   Anemia associated with acute blood loss   Hematemesis/vomiting blood   Melena   Weakness generalized   Health examination of prisoner   Hyponatremia   Thrombocytopenia, unspecified   Esophageal varices   Unspecified gastritis and gastroduodenitis without mention of hemorrhage   Portal  hypertensive gastropathy   Cirrhosis of liver   Splenomegaly   Hypokalemia    Time spent: > 35 MINS    THOMPSON,DANIEL  [facility] Pager [phone]. If 7PM-7AM, please contact night-coverage at [email or web address], password [email or web address] 09/10/2013, 11:54 AM  LOS: 5 days             

## 2013-09-10 NOTE — Op Note (Signed)
[facility] [address] [place], [address]   OPERATIVE PROCEDURE REPORT  PATIENT: Joshua Waller, Joshua Waller  MR#: [id number] BIRTHDATE: November 23, 1951  GENDER: Male ENDOSCOPIST: Jeani Hawking, MD ASSISTANT:   Kandice Robinsons, technician Karoline Caldwell, RN, BSN PROCEDURE DATE: 09/10/2013 PROCEDURE:   Colonoscopy with snare polypectomy ASA CLASS:   Class III INDICATIONS:Polyp. MEDICATIONS: Versed 6 mg IV and Fentanyl 75 mcg IV  DESCRIPTION OF PROCEDURE:   After the risks benefits and alternatives of the procedure were thoroughly explained, informed consent was obtained.  A digital rectal exam revealed no abnormalities of the rectum.    The     endoscope was introduced through the anus  and advanced to the cecum, which was identified by both the appendix and ileocecal valve , No adverse events experienced.    The quality of the prep was good. .  The instrument was then slowly withdrawn as the colon was fully examined.   FINDINGS: In the cecum there was a large 2.5 cm sessile polyp. Adjacent to this polyp was a linear sessile polyp.  Ten ml of normal saline mixed with 1 ml of 1:10,000 was used to lift the largest lesion.  The polyp was removed in a piecemeal fashion using Endocut III at 150/15.Marland Kitchen  An excellent resection was obtained. Attention was directed toward the larger linear cecal polyp.  Five ml of normal saline lifted the lesion, but it was too flate to adequately capture with the stiff snare.  Only superficial portions of the polyp were removed.  I then decied to ablate the rest of the lesion with the tip of the snare with cautery.  After these two largest lesions were treated a total of 9 hemoclips were deployed to decrease the chance for a post polypectomy bleed in the setting of his thrombocytopenia.  A smaller cecal polyp was removed with a cold snare.  Another polyp at the cecal cap was found measuring 1 cm and it was removed with a hot snare.  Three to foud  other 3-4 mm sessile ascending colon and descending colon polyps were identified and removed with a hot snare.  No other abnormalities were found in the colon.          The scope was then withdrawn from the patient and the procedure terminated.  COMPLICATIONS: There were no complications.  IMPRESSION: 1) Multiple colonic polyps. 2) Int/Ext Hemorrhoids.  RECOMMENDATIONS: 1) Await biopsy results. 2) Repeat the colonoscopy in 1 year.  _______________________________ eSigned:  Jeani Hawking, MD 09/10/2013 2:50 PM   PATIENT NAME:  Joshua Waller, Joshua Waller MR#: [id number]

## 2013-09-10 NOTE — Progress Notes (Signed)
[facility] PROGRESS NOTE  Joshua Waller:[id number] DOB: [birth year] DOA: 09/05/2013 PCP: [name], MD  Assessment/Plan: #1. GIB Likely secondary to variceal bleed, s/p banding. Patient presented with hemetemesis x 2 days and 6 days of melanotic stools. On admission hemoglobin was noted to be 3.7. Patient with no further hematemesis or melanotic stools. Patient is status post 6 units packed red blood cells. Hemoglobin now 7.8 and stable. Patient does have a history of alcohol abuse prior history of gastric ulcers per endoscopy of 12/22/2012, and and colonoscopy of 12/23/2012 with internal hemorrhoids and questionable small bowel AVMs. Patient was to have a capsule endoscopy as outpatient however this was never done. Patient is status post 6 units packed red blood cells. Patient s/p EGD 09/06/13 with findings of esophageal varices s/p banding, portal HTN gastropathy and gastritis. H&H is pending. [place] c/w cirrhosis and splenomegaly. Continue oral protonix 40mg  daily. Continue  IV Rocephin. Patient for colonoscopy today for polypectomy. GI ff and appreciate input and rxcs.  #2 acute blood loss anemia Secondary to probable upper GI bleed. Hemoglobin on admission was 3.7. Patient is status post 6 units packed red blood cells. Hgb 7.8 from 8.6. Patient has been seen by GI and s/p EGD. See problem #1.  #3 Esophageal varices/Portal HTN gastropathy/Splenomegaly S/p banding. Patient on IV Rocephin per GI. Will need repeat EGD with banding in 3-4 weeks.?? Will defer start of nadolol to GI.  #4 Cirrhosis Per [place]. Likely ETOH induced. AFP elevated at 18.2. Hep Bs ab/ag neg, HCV Ab neg.??start lasix and aldactone and xifaxin. Will defer to GI.  #5 Colonic polyps Patient for colonoscopy today for polypectomy  #6 history of alcohol abuse Patient states his last drink was 6 days prior to admission. Patient with no noted DTs. Continue Ativan withdrawal protocol. Continue thiamine and folic acid.  Multivitamin. Follow.  #7 generalized weakness Likely secondary to symptomatic anemia. Clinical improvement. Patient s/p 6 units packed red blood cells. Follow. PT/OT.  #8 hyponatremia Likely secondary to hypovolemic hyponatremia in patient with upper GI bleed. NSL IVF. BMET in AM. Supportive care. Follow.  #9 Thrombocytopenia Chronic and secondary to ETOH abuse. No signs of bleeding. Follow.  #10 prophylaxis PPI for GI prophylaxis. SCDs for DVT prophylaxis.  Code Status: Full Family Communication: updated patient at bedside Disposition Plan: Back to correctional facility when medically stable.   Consultants:  GI: Dr Elnoria Howard 09/06/13  Procedures:  CXR 09/05/13  6 units packed red blood cells  EGD 09/06/13  [place] 09/07/13  Antibiotics:  None  HPI/Subjective: Patient denies any further hemetemesis today. Patient with  No melanotic stools.  Objective: Filed Vitals:   09/10/13 0621  BP: 131/62  Pulse: 73  Temp: 98.5 F (36.9 C)  Resp: 18    Intake/Output Summary (Last 24 hours) at 09/10/13 1154 Last data filed at 09/09/13 1846  Gross per 24 hour  Intake    700 ml  Output      0 ml  Net    700 ml   Filed Weights   09/05/13 2158 09/07/13 0400 09/08/13 1029  Weight: 109.4 kg (241 lb 2.9 oz) 107.9 kg (237 lb 14 oz) 117.1 kg (258 lb 2.5 oz)    Exam:   General:  Nad. Dry mucous membranes.  Cardiovascular: rrr  Respiratory: ctab  Abdomen: soft/nt/nd/+bs  Musculoskeletal: no c/c/e  Data Reviewed: Basic Metabolic Panel:  Recent Labs Lab 09/07/13 0819 09/08/13 0412 09/08/13 1705 09/09/13 0647 09/10/13 0515  NA 134* 129* 128* 133* 133*  K 4.0 2.7* 3.6 3.2* 3.3*  CL 104 99 101 103 105  CO2 19 22 22 20 20   GLUCOSE 134* 146* 136* 144* 151*  BUN 15 9 6  5* 3*  CREATININE 0.65 0.58 0.56 0.56 0.54  CALCIUM 7.7* 7.7* 7.4* 7.7* 7.8*  MG  --  2.2  --   --   --    Liver Function Tests:  Recent Labs Lab 09/05/13 1805 09/09/13 0647  AST 34 68*  ALT 22  32  ALKPHOS 42 59  BILITOT 1.2 1.1  PROT 6.1 5.8*  ALBUMIN 2.5* 2.4*    Recent Labs Lab 09/05/13 1805  LIPASE 19   No results found for this basename: AMMONIA,  in the last 168 hours CBC:  Recent Labs Lab 09/07/13 1549 09/08/13 0412 09/08/13 1705 09/09/13 0647 09/10/13 0515  WBC 5.2 3.8* 3.9* 3.3* 3.4*  HGB 8.5* 7.9* 7.9* 7.8* 7.8*  HCT 25.5* 23.2* 23.6* 24.0* 24.0*  MCV 82.3 81.7 82.2 82.8 83.3  PLT 80* 68* 69* 67* 79*   Cardiac Enzymes:  Recent Labs Lab 09/05/13 1805  TROPONINI <0.30   BNP (last 3 results)  Recent Labs  12/21/12 1705  PROBNP 206.0*   CBG: No results found for this basename: GLUCAP,  in the last 168 hours  Recent Results (from the past 240 hour(s))  MRSA PCR SCREENING     Status: None   Collection Time    09/05/13  9:51 PM      Result Value Range Status   MRSA by PCR NEGATIVE  NEGATIVE Final   Comment:            The GeneXpert MRSA Assay (FDA     approved for NASAL specimens     only), is one component of a     comprehensive MRSA colonization     surveillance program. It is not     intended to diagnose MRSA     infection nor to guide or     monitor treatment for     MRSA infections.     Studies: No results found.  Scheduled Meds: . cefTRIAXone (ROCEPHIN)  IV  1 g Intravenous Q24H  . folic acid  1 mg Oral Daily  . multivitamin with minerals  1 tablet Oral Daily  . pantoprazole  40 mg Oral Daily  . potassium chloride  40 mEq Oral Q4H  . sodium chloride  10-40 mL Intracatheter Q12H  . thiamine  100 mg Oral Daily   Continuous Infusions:    Principal Problem:   GIB (gastrointestinal bleeding) Active Problems:   ETOH abuse   Anemia associated with acute blood loss   Hematemesis/vomiting blood   Melena   Weakness generalized   Health examination of prisoner   Hyponatremia   Thrombocytopenia, unspecified   Esophageal varices   Unspecified gastritis and gastroduodenitis without mention of hemorrhage   Portal  hypertensive gastropathy   Cirrhosis of liver   Splenomegaly   Hypokalemia    Time spent: > 35 MINS    [facility]  [facility] Pager [phone]. If 7PM-7AM, please contact night-coverage at [email or web address], password [email or web address] 09/10/2013, 11:54 AM  LOS: 5 days

## 2013-09-10 NOTE — Interval H&P Note (Signed)
History and Physical Interval Note:  09/10/2013 1:19 PM  Joshua Waller  has presented today for surgery, with the diagnosis of colon polyp  The various methods of treatment have been discussed with the patient and family. After consideration of risks, benefits and other options for treatment, the patient has consented to  Procedure(s): COLONOSCOPY (N/A) as a surgical intervention .  The patient's history has been reviewed, patient examined, no change in status, stable for surgery.  I have reviewed the patient's chart and labs.  Questions were answered to the patient's satisfaction.     Chelesa Weingartner D

## 2013-09-11 ENCOUNTER — Encounter (HOSPITAL_COMMUNITY): Payer: Self-pay | Admitting: Gastroenterology

## 2013-09-11 DIAGNOSIS — M129 Arthropathy, unspecified: Secondary | ICD-10-CM

## 2013-09-11 LAB — CBC
MCH: 27.4 pg (ref 26.0–34.0)
MCHC: 32.8 g/dL (ref 30.0–36.0)
MCV: 83.6 fL (ref 78.0–100.0)
Platelets: 87 10*3/uL — ABNORMAL LOW (ref 150–400)
RDW: 18.6 % — ABNORMAL HIGH (ref 11.5–15.5)

## 2013-09-11 LAB — BASIC METABOLIC PANEL
Calcium: 7.9 mg/dL — ABNORMAL LOW (ref 8.4–10.5)
Creatinine, Ser: 0.54 mg/dL (ref 0.50–1.35)
GFR calc Af Amer: >90 mL/min (ref >90)
GFR calc non Af Amer: >90 mL/min (ref >90)
Sodium: 133 mEq/L — ABNORMAL LOW (ref 135–145)

## 2013-09-11 MED ORDER — THIAMINE HCL 100 MG PO TABS: 100.0000 mg | ORAL_TABLET | Freq: Every day | ORAL | Status: DC

## 2013-09-11 MED ORDER — FOLIC ACID 1 MG PO TABS: 1.0000 mg | ORAL_TABLET | Freq: Every day | ORAL | Status: DC

## 2013-09-11 MED ORDER — SODIUM CHLORIDE 0.9 % IJ SOLN
10.0000 mL | INTRAMUSCULAR | Status: DC | PRN
  Administered 2013-09-11: 10 mL

## 2013-09-11 MED ORDER — ADULT MULTIVITAMIN W/MINERALS CH: 1.0000 | ORAL_TABLET | Freq: Every day | ORAL | Status: DC

## 2013-09-11 MED ORDER — PANTOPRAZOLE SODIUM 40 MG PO TBEC: 40.0000 mg | DELAYED_RELEASE_TABLET | Freq: Every day | ORAL | Status: DC

## 2013-09-11 MED ORDER — CIPROFLOXACIN HCL 500 MG PO TABS: 500.0000 mg | ORAL_TABLET | Freq: Two times a day (BID) | ORAL | Status: DC

## 2013-09-11 NOTE — Discharge Summary (Signed)
Physician Discharge Summary  Joshua Waller ZOX:[id number] DOB: 1951-02-20 DOA: 09/05/2013  PCP: [name], MD  Admit date: 09/05/2013 Discharge date: 09/11/2013  Time spent: >64minutes  Recommendations for Outpatient Follow-up:  Follow-up Information   Follow up with [name], MD. (in 1-2weeks, call for appt upon discharge)    Specialty:  Family Medicine   Contact information:   [address] [place] [address] [phone]       Follow up with [name], MD. (in 2weeks as directed)    Specialty:  Gastroenterology   Contact information:   [address] [place] [address] [phone]        Discharge Diagnoses:  Principal Problem:   GIB (gastrointestinal bleeding) Active Problems:   ETOH abuse   Anemia associated with acute blood loss   Hematemesis/vomiting blood   Melena   Weakness generalized   Health examination of prisoner   Hyponatremia   Thrombocytopenia, unspecified   Esophageal varices   Unspecified gastritis and gastroduodenitis without mention of hemorrhage   Portal hypertensive gastropathy   Cirrhosis of liver   Splenomegaly   Hypokalemia   Discharge Condition: improved/stable  Diet recommendation: Low sodium heart healthy  Filed Weights   09/05/13 2158 09/07/13 0400 09/08/13 1029  Weight: 109.4 kg (241 lb 2.9 oz) 107.9 kg (237 lb 14 oz) 117.1 kg (258 lb 2.5 oz)    History of present illness:  The pt is a 62 yo male h/o gib, etoh abuse, arthritis comes in with vomiting coffee ground material on Saturday and melenotic stools for several days. Was placed in jail also on sat. Last etoh intake was Friday night (6 nights ago). After several days of vomiting blood he started getting light headed and very weak. No loc. No cp. No sob. He has had no further vomiting or black stool for about 24 hours. Finally brought to ED today when he almost passed out in jail. No fevers. Some mild epigastric pain but not really  significant. Denies any nsaid use since starting lyrica.   Hospital Course:  #1. GIB  Likely secondary to variceal bleed, s/p banding. Patient presented with hemetemesis x 2 days and 6 days of melanotic stools. On admission hemoglobin was noted to be 3.7. Patient with no further hematemesis or melanotic stools. He was transfused a total of 6 units packed red blood cells. Hemoglobin now 8.2 prior to discharge and stable. Patient does have a history of alcohol abuse prior history of gastric ulcers per endoscopy of 12/22/2012, and and colonoscopy of 12/23/2012 with internal hemorrhoids and questionable small bowel AVMs. Patient was to have a capsule endoscopy as outpatient however this was never done. Upon admission GI was consulted and an EGD was done 09/06/13 with findings of esophageal varices s/p banding, portal HTN gastropathy and gastritis. He was placed on IV antibiotics-Rocephin for GI, and Dr. Elnoria Howard recommends discharging on oral antibiotics to complete a total of 7 days of antibiotics.  [place] c/w cirrhosis and splenomegaly. Continue oral protonix 40mg  daily. He had a colonoscopy done per Dr. Elnoria Howard on 9/16 which showed multiple colonic polyps in the he removed 6-7 polyps and recommends followup colonoscopy in a year. Patient was also found to have internal/external hemorrhoids. On followup today patient's hemoglobin has remained stable-8.2 as above, she is tolerating by mouth well. I discussed patient with Dr. Elnoria Howard and from his standpoint okay to discharge and his to followup with him outpatient in 2 weeks. #2 acute blood loss anemia  Secondary  to probable upper GI bleed. Hemoglobin on admission was 3.7. Patient is status post 6 units packed red blood cells. Hgb 8.2 today prior to discharge. Patient has been seen by GI and s/p EGD. See problem #1.  #3 Esophageal varices/Portal HTN gastropathy/Splenomegaly  S/p banding. Patient on IV Rocephin per GI initially and change to Cipro on discharge as recommended  per Dr. Elnoria Howard to complete a total of 7 days of abx. Will need repeat EGD with banding in 3-4 weeks.??-His to followup with Dr. Elnoria Howard in 2 weeks as above.Will defer start of nadolol to GI up on followup outpt.  #4 Cirrhosis  Per [place]. Likely ETOH induced. AFP elevated at 18.2. Hep Bs ab/ag neg, HCV Ab neg. is to follow up with PCP and GI outpatient for close monitoring of his fluid status and to consider starting lasix and aldactone, xifaxin when clinically appropriate.  #5 Colonic polyps  Status post colonoscopy 9/16 with polypectomies as above  #6 history of alcohol abuse  Patient states his last drink was 6 days prior to admission. Patient with no noted DTs. He was placed on the Ativan withdrawal protocol while in the hospital and his had no evidence of DTs. He is toContinue thiamine and folic acid. Multivitamin and follow up outpatient.  #7 generalized weakness  Likely secondary to symptomatic anemia. Clinical improvement. Patient s/p 6 units packed red blood cells. Follow. PT/OT.  #8 hyponatremia  Likely secondary to hypovolemic hyponatremia in patient with upper GI bleed. NSL IVF. BMET in AM. Supportive care. Follow.  #9 Thrombocytopenia  Chronic and secondary to ETOH abuse. No signs of bleeding, at this time, follow up outpatient      : Procedures:  CXR 09/05/13  6 units packed red blood cells  EGD 09/06/13  [place] 09/07/13 Colonoscopy 9/16  Consultations:  GI-Dr. Elnoria Howard  Discharge Exam: Filed Vitals:   09/11/13 0618  BP: 136/56  Pulse: 72  Temp: 98.5 F (36.9 C)  Resp: 16   Exam:  General: Nad. Dry mucous membranes.  Cardiovascular: rrr  Respiratory: ctab  Abdomen: soft/nt/nd/+bs  Musculoskeletal: no c/c/e   Discharge Instructions  Discharge Orders   Future Orders Complete By Expires   Diet - low sodium heart healthy  As directed    Increase activity slowly  As directed        Medication List         ciprofloxacin 500 MG tablet  Commonly known as:  CIPRO  Take 1  tablet (500 mg total) by mouth 2 (two) times daily.     folic acid 1 MG tablet  Commonly known as:  FOLVITE  Take 1 tablet (1 mg total) by mouth daily.     gabapentin 600 MG tablet  Commonly known as:  NEURONTIN  Take 600 mg by mouth 3 (three) times daily.     LYRICA PO  Take 1 capsule by mouth 3 (three) times daily.     multivitamin with minerals Tabs tablet  Take 1 tablet by mouth daily.     pantoprazole 40 MG tablet  Commonly known as:  PROTONIX  Take 1 tablet (40 mg total) by mouth daily.     thiamine 100 MG tablet  Take 1 tablet (100 mg total) by mouth daily.     traMADol 50 MG tablet  Commonly known as:  ULTRAM  Take 50 mg by mouth every 6 (six) hours as needed for pain.       No Known Allergies     Follow-up Information  Follow up with [name], MD. (in 1-2weeks, call for appt upon discharge)    Specialty:  Family Medicine   Contact information:   [address] [address] [phone]       Follow up with [name], MD. (in 2weeks as directed)    Specialty:  Gastroenterology   Contact information:   [address] [place] [address] [phone]        The results of significant diagnostics from this hospitalization (including imaging, microbiology, ancillary and laboratory) are listed below for reference.    Significant Diagnostic Studies: US Abdomen Complete  09/07/2013   CLINICAL DATA:  Alcohol abuse, evaluate for cirrhosis or HCC  EXAM: ABDOMEN ULTRASOUND  COMPARISON:  None.  FINDINGS: Gallbladder  Gallbladder sludge. No gallbladder wall thickening or pericholecystic fluid. Negative sonographic Murphy's sign.  Common bile duct  Diameter: 5 mm.  Liver  Coarse, echogenic hepatic echotexture with nodular hepatic contour, suggesting cirrhosis. No gross focal hepatic lesion is seen.  IVC  No abnormality visualized.  Pancreas  Poorly visualized due to overlying bowel gas.  Spleen  Enlarged, measuring 16.9 cm in  maximal dimension (calculated volume 1134 mL).  Right Kidney  Length: 12.5 cm. Echogenicity within normal limits. No mass or hydronephrosis visualized.  Left Kidney  Length: 12.5 cm. Echogenicity within normal limits. No mass or hydronephrosis visualized.  Abdominal aorta  No aneurysm visualized.  IMPRESSION: Cirrhosis.  No focal hepatic lesion is evident by ultrasound. If there is high clinical suspicion, consider MRI.  Splenomegaly.   Electronically Signed   By: Charline Bills M.D.   On: 09/07/2013 11:02   Dg Chest Portable 1 View  09/05/2013   *RADIOLOGY REPORT*  Clinical Data: Left-sided chest pain  PORTABLE CHEST - 1 VIEW  Comparison: -12/21/12  Findings: The cardiac shadow is stable.  The lungs are well-aerated bilaterally without focal infiltrate.  Minimal right basilar atelectasis is noted.  No acute bony abnormality is seen.  IMPRESSION: Minimal right basilar atelectasis.   Original Report Authenticated By: Alcide Clever, M.D.    Microbiology: Recent Results (from the past 240 hour(s))  MRSA PCR SCREENING     Status: None   Collection Time    09/05/13  9:51 PM      Result Value Range Status   MRSA by PCR NEGATIVE  NEGATIVE Final   Comment:            The GeneXpert MRSA Assay (FDA     approved for NASAL specimens     only), is one component of a     comprehensive MRSA colonization     surveillance program. It is not     intended to diagnose MRSA     infection nor to guide or     monitor treatment for     MRSA infections.     Labs: Basic Metabolic Panel:  Recent Labs Lab 09/08/13 0412 09/08/13 1705 09/09/13 0647 09/10/13 0515 09/11/13 0550  NA 129* 128* 133* 133* 133*  K 2.7* 3.6 3.2* 3.3* 3.6  CL 99 101 103 105 103  CO2 22 22 20 20 21   GLUCOSE 146* 136* 144* 151* 154*  BUN 9 6 5* 3* 4*  CREATININE 0.58 0.56 0.56 0.54 0.54  CALCIUM 7.7* 7.4* 7.7* 7.8* 7.9*  MG 2.2  --   --   --   --    Liver Function Tests:  Recent Labs Lab 09/05/13 1805 09/09/13 0647  AST  34 68*  ALT  22 32  ALKPHOS 42 59  BILITOT 1.2 1.1  PROT 6.1 5.8*  ALBUMIN 2.5* 2.4*    Recent Labs Lab 09/05/13 1805  LIPASE 19   No results found for this basename: AMMONIA,  in the last 168 hours CBC:  Recent Labs Lab 09/08/13 0412 09/08/13 1705 09/09/13 0647 09/10/13 0515 09/11/13 0550  WBC 3.8* 3.9* 3.3* 3.4* 3.6*  HGB 7.9* 7.9* 7.8* 7.8* 8.2*  HCT 23.2* 23.6* 24.0* 24.0* 25.0*  MCV 81.7 82.2 82.8 83.3 83.6  PLT 68* 69* 67* 79* 87*   Cardiac Enzymes:  Recent Labs Lab 09/05/13 1805  TROPONINI <0.30   BNP: BNP (last 3 results)  Recent Labs  12/21/12 1705  PROBNP 206.0*   CBG: No results found for this basename: GLUCAP,  in the last 168 hours     Signed:  Seham Gardenhire C  [facility] 09/11/2013, 1:09 PM

## 2013-09-25 ENCOUNTER — Emergency Department (HOSPITAL_COMMUNITY)
Admission: EM | Admit: 2013-09-25 | Discharge: 2013-09-25 | Disposition: A | Discharge status: Home / Self Care | Payer: Medicare Other | Attending: Emergency Medicine | Admitting: Emergency Medicine

## 2013-09-25 ENCOUNTER — Emergency Department (HOSPITAL_COMMUNITY): Payer: Medicare Other

## 2013-09-25 DIAGNOSIS — R5381 Other malaise: Secondary | ICD-10-CM | POA: Insufficient documentation

## 2013-09-25 DIAGNOSIS — H409 Unspecified glaucoma: Secondary | ICD-10-CM | POA: Insufficient documentation

## 2013-09-25 DIAGNOSIS — R5383 Other fatigue: Secondary | ICD-10-CM

## 2013-09-25 DIAGNOSIS — Z8739 Personal history of other diseases of the musculoskeletal system and connective tissue: Secondary | ICD-10-CM | POA: Insufficient documentation

## 2013-09-25 DIAGNOSIS — Z862 Personal history of diseases of the blood and blood-forming organs and certain disorders involving the immune mechanism: Secondary | ICD-10-CM | POA: Insufficient documentation

## 2013-09-25 DIAGNOSIS — G8929 Other chronic pain: Secondary | ICD-10-CM | POA: Insufficient documentation

## 2013-09-25 DIAGNOSIS — Z79899 Other long term (current) drug therapy: Secondary | ICD-10-CM | POA: Insufficient documentation

## 2013-09-25 DIAGNOSIS — G5790 Unspecified mononeuropathy of unspecified lower limb: Secondary | ICD-10-CM | POA: Insufficient documentation

## 2013-09-25 DIAGNOSIS — Z8719 Personal history of other diseases of the digestive system: Secondary | ICD-10-CM | POA: Insufficient documentation

## 2013-09-25 DIAGNOSIS — R42 Dizziness and giddiness: Secondary | ICD-10-CM

## 2013-09-25 LAB — CBC
HCT: 29.4 % — ABNORMAL LOW (ref 39.0–52.0)
Hemoglobin: 9.4 g/dL — ABNORMAL LOW (ref 13.0–17.0)
MCH: 25.8 pg — ABNORMAL LOW (ref 26.0–34.0)
MCHC: 32 g/dL (ref 30.0–36.0)
MCV: 80.8 fL (ref 78.0–100.0)
Platelets: 154 10*3/uL (ref 150–400)
RBC: 3.64 MIL/uL — ABNORMAL LOW (ref 4.22–5.81)
RDW: 17.9 % — ABNORMAL HIGH (ref 11.5–15.5)
WBC: 4.6 10*3/uL (ref 4.0–10.5)

## 2013-09-25 LAB — COMPREHENSIVE METABOLIC PANEL
ALT: 26 U/L (ref 0–53)
AST: 47 U/L — ABNORMAL HIGH (ref 0–37)
Albumin: 3.1 g/dL — ABNORMAL LOW (ref 3.5–5.2)
Alkaline Phosphatase: 66 U/L (ref 39–117)
BUN: 9 mg/dL (ref 6–23)
CO2: 19 mEq/L (ref 19–32)
Calcium: 8.7 mg/dL (ref 8.4–10.5)
Chloride: 96 mEq/L (ref 96–112)
Creatinine, Ser: 0.58 mg/dL (ref 0.50–1.35)
GFR calc Af Amer: >90 mL/min (ref >90)
GFR calc non Af Amer: >90 mL/min (ref >90)
Glucose, Bld: 144 mg/dL — ABNORMAL HIGH (ref 70–99)
Potassium: 3.7 mEq/L (ref 3.5–5.1)
Sodium: 130 mEq/L — ABNORMAL LOW (ref 135–145)
Total Bilirubin: 1.7 mg/dL — ABNORMAL HIGH (ref 0.3–1.2)
Total Protein: 7.7 g/dL (ref 6.0–8.3)

## 2013-09-25 LAB — AMMONIA: Ammonia: 33 umol/L (ref 11–60)

## 2013-09-25 LAB — T4: T4, Total: 5.1 ug/dL (ref 5.0–12.5)

## 2013-09-25 LAB — POCT I-STAT TROPONIN I: Troponin i, poc: 0 ng/mL (ref 0.00–0.08)

## 2013-09-25 LAB — TSH: TSH: 47.164 u[IU]/mL — ABNORMAL HIGH (ref 0.350–4.500)

## 2013-09-25 NOTE — ED Notes (Signed)
MD notified pt less alert than before. MD notified and at bedside for assessment. No focal neuro deficits noted.

## 2013-09-25 NOTE — ED Notes (Signed)
Just d/c from [facility] last week for GI bleed and  Yesterday started to  Feel  confuseed  And  Weak denies cp just weakness

## 2013-10-03 NOTE — ED Provider Notes (Signed)
CSN: [id number]     Arrival date & time 09/25/13  1204 History   First MD Initiated Contact with Patient 09/25/13 1223     Chief Complaint  Patient presents with  . Chest Pain   (Consider location/radiation/quality/duration/timing/severity/associated sxs/prior Treatment) HPI  62 year old male with generalized fatigue. Patient with recent admission to the hospital for GI bleed. He states that since discharge he has felt continued weakness and fatigue. He feels "slow." He feels like there is a lag between his thoughts and his actions. No fevers or chills. No progressive blood parenchyma melena. Denies any acute pain. No shortness of breath. History of alcohol abuse but states that he is abstaining since his recent illness. No history of any thyroid dysfunction that he is aware of. Patient has not followed up with primary care provider since he was discharged.  Past Medical History  Diagnosis Date  . Lumbar disc disorder   . Anemia   . Arthritis   . Neuropathy of lower extremity 2007    very poor balance  . Glaucoma (increased eye pressure)     bilateral  . Gastric ulcer   . Chronic back pain   . Alcohol abuse   . 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 Gastritis   . GI bleeding    Past Surgical History  Procedure Laterality Date  . Esophagogastroduodenoscopy  12/29/2011    Procedure: ESOPHAGOGASTRODUODENOSCOPY (EGD);  Surgeon: Erick Blinks, MD;  Location: [facility] ENDOSCOPY;  Service: Gastroenterology;  Laterality: N/A;  . Tonsillectomy    . Esophagogastroduodenoscopy  12/22/2012    Procedure: ESOPHAGOGASTRODUODENOSCOPY (EGD);  Surgeon: Shirley Friar, MD;  Location: [facility] ENDOSCOPY;  Service: Endoscopy;  Laterality: N/A;  . Colonoscopy  12/23/2012    Procedure: COLONOSCOPY;  Surgeon: Shirley Friar, MD;  Location: [facility] ENDOSCOPY;  Service: Endoscopy;  Laterality: N/A;  . Esophagogastroduodenoscopy N/A 09/06/2013    Procedure: ESOPHAGOGASTRODUODENOSCOPY (EGD);  Surgeon: [name], MD;   Location: [facility] ENDOSCOPY;  Service: Endoscopy;  Laterality: N/A;  bedside  . Colonoscopy N/A 09/10/2013    Procedure: COLONOSCOPY;  Surgeon: [name], MD;  Location: [facility] ENDOSCOPY;  Service: Endoscopy;  Laterality: N/A;   No family history on file. History  Substance Use Topics  . Smoking status: Never Smoker   . Smokeless tobacco: Never Used  . Alcohol Use: 3.6 oz/week    6 Cans of beer per week     Comment: heavy drinker    Review of Systems  All systems reviewed and negative, other than as noted in HPI.   Allergies  Review of patient's allergies indicates no known allergies.  Home Medications   Current Outpatient Rx  Name  Route  Sig  Dispense  Refill  . gabapentin (NEURONTIN) 600 MG tablet   Oral   Take 600 mg by mouth 3 (three) times daily.         . Pregabalin (LYRICA PO)   Oral   Take 1 capsule by mouth 3 (three) times daily.         . traMADol (ULTRAM) 50 MG tablet   Oral   Take 50 mg by mouth every 6 (six) hours as needed for pain.          BP 126/61  Pulse 78  Temp(Src) 97.7 F (36.5 C)  Resp 18  SpO2 97% Physical Exam  Nursing note and vitals reviewed. Constitutional: He appears well-developed and well-nourished. No distress.  HENT:  Head: Normocephalic and atraumatic.  Eyes: Conjunctivae are normal. Right eye exhibits no  discharge. Left eye exhibits no discharge.  Neck: Neck supple.  Cardiovascular: Normal rate, regular rhythm and normal heart sounds.  Exam reveals no gallop and no friction rub.   No murmur heard. Pulmonary/Chest: Effort normal and breath sounds normal. No respiratory distress.  Abdominal: Soft. He exhibits no distension. There is no tenderness.  Musculoskeletal: He exhibits no edema and no tenderness.  Neurological: He is alert.  Skin: Skin is warm and dry.  Psychiatric: He has a normal mood and affect. His behavior is normal. Thought content normal.    ED Course  Procedures (including critical care time) Labs  Review Labs Reviewed  CBC - Abnormal; Notable for the following:    RBC 3.64 (*)    Hemoglobin 9.4 (*)    HCT 29.4 (*)    MCH 25.8 (*)    RDW 17.9 (*)    All other components within normal limits  COMPREHENSIVE METABOLIC PANEL - Abnormal; Notable for the following:    Sodium 130 (*)    Glucose, Bld 144 (*)    Albumin 3.1 (*)    AST 47 (*)    Total Bilirubin 1.7 (*)    All other components within normal limits  TSH - Abnormal; Notable for the following:    TSH 47.164 (*)    All other components within normal limits  AMMONIA  T4  POCT I-STAT TROPONIN I   Imaging Review No results found.  EKG Interpretation   None      EKG:  Rhythm: normal sinus Vent. rate 80 BPM PR interval 140 ms QRS duration 82 ms QT/QTc 430/495 ms ST segments: NS ST changes   MDM   1. Fatigue   2. Dizziness and giddiness    62 year old male with generalized fatigue. Recent admission for GI bleed. Possibly some component of deconditioning given his hospital stay and illness. Has a nonfocal neurological examination but he did appear pretty drowsy. Awakens to voice and following questions and commands appropriately. His hemoglobin is up from last value for comparison. His platelets are now normal. He reports that he has not continued to drink. He reports no continued symptoms of GI bleed.  Patient currently has the outpatient followup. Discussed the need to establish this. We'll obtain thyroid studies, discussed with patient that these will not be resulted today and he needs to have these followed up by whomever he establishes care with. He is given a list of a few primary care doctor's in the area as well as resource list.    Raeford Razor, MD 10/03/13 1452

## 2013-10-15 ENCOUNTER — Other Ambulatory Visit (HOSPITAL_COMMUNITY): Payer: Self-pay | Admitting: Family Medicine

## 2013-10-17 ENCOUNTER — Other Ambulatory Visit (HOSPITAL_COMMUNITY): Payer: Self-pay | Admitting: Family Medicine

## 2013-10-17 ENCOUNTER — Emergency Department (HOSPITAL_COMMUNITY)
Admission: EM | Admit: 2013-10-17 | Discharge: 2013-10-17 | Disposition: A | Discharge status: Home / Self Care | Payer: Medicare Other | Attending: Emergency Medicine | Admitting: Emergency Medicine

## 2013-10-17 ENCOUNTER — Encounter (HOSPITAL_COMMUNITY): Payer: Self-pay | Admitting: Emergency Medicine

## 2013-10-17 DIAGNOSIS — Z8669 Personal history of other diseases of the nervous system and sense organs: Secondary | ICD-10-CM | POA: Insufficient documentation

## 2013-10-17 DIAGNOSIS — G8929 Other chronic pain: Secondary | ICD-10-CM | POA: Insufficient documentation

## 2013-10-17 DIAGNOSIS — K5900 Constipation, unspecified: Secondary | ICD-10-CM | POA: Insufficient documentation

## 2013-10-17 DIAGNOSIS — K649 Unspecified hemorrhoids: Secondary | ICD-10-CM | POA: Insufficient documentation

## 2013-10-17 DIAGNOSIS — Z79899 Other long term (current) drug therapy: Secondary | ICD-10-CM | POA: Insufficient documentation

## 2013-10-17 DIAGNOSIS — Z8739 Personal history of other diseases of the musculoskeletal system and connective tissue: Secondary | ICD-10-CM | POA: Insufficient documentation

## 2013-10-17 DIAGNOSIS — C220 Liver cell carcinoma: Secondary | ICD-10-CM

## 2013-10-17 DIAGNOSIS — Z862 Personal history of diseases of the blood and blood-forming organs and certain disorders involving the immune mechanism: Secondary | ICD-10-CM | POA: Insufficient documentation

## 2013-10-17 MED ORDER — DOCUSATE SODIUM 100 MG PO CAPS: 100.0000 mg | ORAL_CAPSULE | Freq: Two times a day (BID) | ORAL | Status: DC

## 2013-10-17 MED ORDER — HYDROCORTISONE ACETATE 25 MG RE SUPP
25.0000 mg | Freq: Once | RECTAL | Status: AC
  Administered 2013-10-17: 25 mg via RECTAL
  Filled 2013-10-17 (×2): qty 1

## 2013-10-17 MED ORDER — MORPHINE SULFATE 4 MG/ML IJ SOLN
4.0000 mg | Freq: Once | INTRAMUSCULAR | Status: AC
  Administered 2013-10-17: 4 mg via INTRAMUSCULAR
  Filled 2013-10-17: qty 1

## 2013-10-17 MED ORDER — PRAMOXINE HCL 1 % RE FOAM: RECTAL | Status: DC | PRN

## 2013-10-17 MED ORDER — ONDANSETRON 8 MG PO TBDP
8.0000 mg | ORAL_TABLET | Freq: Once | ORAL | Status: AC
  Administered 2013-10-17: 8 mg via ORAL
  Filled 2013-10-17: qty 1

## 2013-10-17 NOTE — ED Provider Notes (Signed)
Medical screening examination/treatment/procedure(s) were performed by non-physician practitioner and as supervising physician I was immediately available for consultation/collaboration.  EKG Interpretation   None        Toy Baker, MD 10/17/13 2304

## 2013-10-17 NOTE — ED Provider Notes (Signed)
CSN: [id number]     Arrival date & time 10/17/13  1907 History   First MD Initiated Contact with Patient 10/17/13 1913     Chief Complaint  Patient presents with  . Hemorrhoids   (Consider location/radiation/quality/duration/timing/severity/associated sxs/prior Treatment) HPI Comments: Patient is a 62 year old male with history of anemia, arthritis, neuropathy, glaucoma, gastric ulcer, alcohol abuse, Mallory-Weiss tear, esophageal varices, gastritis, GI bleed who presents today for painful hemorrhoids. He reports that this all began after his colonoscopy in September. He was admitted with a GI bleed at that time and had both an EGD and colonoscopy. After the colonoscopy he became very constipated. Since that time he is had worsening rectal pain. There is no associated abdominal pain. The pain is sharp and worse with sitting. The pain is improved with laying on his side. Today he passed stool while sitting in the bathtub. The stool was soft and brown. He denies any melena or hematochezia. He denies any worsening shortness of breath or pallor. This pain feels like his normal hemorrhoid pain. He has an appointment with his GI doctor in the near future.   The history is provided by the patient. No language interpreter was used.    Past Medical History  Diagnosis Date  . Lumbar disc disorder   . Anemia   . Arthritis   . Neuropathy of lower extremity 2007    very poor balance  . Glaucoma (increased eye pressure)     bilateral  . Gastric ulcer   . Chronic back pain   . Alcohol abuse   . 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 Gastritis   . GI bleeding    Past Surgical History  Procedure Laterality Date  . Esophagogastroduodenoscopy  12/29/2011    Procedure: ESOPHAGOGASTRODUODENOSCOPY (EGD);  Surgeon: Erick Blinks, MD;  Location: [facility] ENDOSCOPY;  Service: Gastroenterology;  Laterality: N/A;  . Tonsillectomy    . Esophagogastroduodenoscopy  12/22/2012    Procedure: ESOPHAGOGASTRODUODENOSCOPY (EGD);   Surgeon: Shirley Friar, MD;  Location: [facility] ENDOSCOPY;  Service: Endoscopy;  Laterality: N/A;  . Colonoscopy  12/23/2012    Procedure: COLONOSCOPY;  Surgeon: Shirley Friar, MD;  Location: [facility] ENDOSCOPY;  Service: Endoscopy;  Laterality: N/A;  . Esophagogastroduodenoscopy N/A 09/06/2013    Procedure: ESOPHAGOGASTRODUODENOSCOPY (EGD);  Surgeon: [name], MD;  Location: [facility] ENDOSCOPY;  Service: Endoscopy;  Laterality: N/A;  bedside  . Colonoscopy N/A 09/10/2013    Procedure: COLONOSCOPY;  Surgeon: [name], MD;  Location: [facility] ENDOSCOPY;  Service: Endoscopy;  Laterality: N/A;   No family history on file. History  Substance Use Topics  . Smoking status: Never Smoker   . Smokeless tobacco: Never Used  . Alcohol Use: 3.6 oz/week    6 Cans of beer per week     Comment: heavy drinker    Review of Systems  Constitutional: Negative for fever and chills.  Respiratory: Negative for shortness of breath.   Cardiovascular: Negative for chest pain.  Gastrointestinal: Positive for constipation and rectal pain. Negative for abdominal pain, blood in stool and anal bleeding.  All other systems reviewed and are negative.    Allergies  Review of patient's allergies indicates no known allergies.  Home Medications   Current Outpatient Rx  Name  Route  Sig  Dispense  Refill  . gabapentin (NEURONTIN) 600 MG tablet   Oral   Take 600 mg by mouth 3 (three) times daily.         . Pregabalin (LYRICA PO)   Oral  Take 1 capsule by mouth 3 (three) times daily.         . traMADol (ULTRAM) 50 MG tablet   Oral   Take 50 mg by mouth every 6 (six) hours as needed for pain.          BP 122/66  Pulse 88  Temp(Src) 97.8 F (36.6 C) (Oral)  Resp 14  SpO2 100% Physical Exam  Nursing note and vitals reviewed. Constitutional: He is oriented to person, place, and time. He appears well-developed and well-nourished. No distress.  HENT:  Head: Normocephalic and atraumatic.  Right Ear:  External ear normal.  Left Ear: External ear normal.  Nose: Nose normal.  Eyes: Conjunctivae are normal.  Neck: Normal range of motion. No tracheal deviation present.  Cardiovascular: Normal rate, regular rhythm and normal heart sounds.   Pulmonary/Chest: Effort normal and breath sounds normal. No stridor.  Abdominal: Soft. He exhibits no distension. There is no tenderness.  Genitourinary: Rectal exam shows tenderness. Rectal exam shows no fissure and anal tone normal.  Soft brown stool in rectal vault. No gross blood seen.  Good rectal tone. Tenderness with rectal exam.   Musculoskeletal: Normal range of motion.  Neurological: He is alert and oriented to person, place, and time.  Skin: Skin is warm and dry. He is not diaphoretic.  Psychiatric: He has a normal mood and affect. His behavior is normal.    ED Course  Procedures (including critical care time) Labs Review Labs Reviewed - No data to display Imaging Review No results found.  EKG Interpretation   None       MDM   1. Hemorrhoids    Pt to ER with hemorrhoids. Home care discussed including sitz baths 15 min TID. Dc w procto-foam and stool softener and recommendation for follow up with his GI doctor. Pt w normal VS and in NAD prior to dc. No concern for GI bleed today. No gross blood seen on rectal exam. Brown stool in rectal vault. Discussed this case with Dr. Freida Busman who agrees with plan. Return instructions given. Vital signs stable for discharge.    Mora Bellman, PA-C 10/17/13 2137

## 2013-10-17 NOTE — ED Notes (Signed)
Pt presents with painful hemorrhoids, states that he thinks one may have busted today. Pt also states that he has been constipated.

## 2013-10-21 ENCOUNTER — Other Ambulatory Visit: Payer: Self-pay | Admitting: Gastroenterology

## 2013-10-25 ENCOUNTER — Ambulatory Visit (HOSPITAL_COMMUNITY): Admission: RE | Admit: 2013-10-25 | Payer: Medicare Other | Source: Ambulatory Visit

## 2013-11-05 ENCOUNTER — Ambulatory Visit (HOSPITAL_COMMUNITY): Payer: Medicare Other

## 2013-11-07 NOTE — H&P (Signed)
  Joshua Waller HPI: On 09/06/2013 the patient underwent an EGD with banding.  He was identified to have a severe anemia and further treatment was necessary.  Small varices were identified and it was felt that the varices were decompressed from his severe bleeding.  Past Medical History  Diagnosis Date  . Lumbar disc disorder   . Anemia   . Arthritis   . Neuropathy of lower extremity 2007    very poor balance  . Glaucoma (increased eye pressure)     bilateral  . Gastric ulcer   . Chronic back pain   . Alcohol abuse   . 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 Gastritis   . GI bleeding     Past Surgical History  Procedure Laterality Date  . Esophagogastroduodenoscopy  12/29/2011    Procedure: ESOPHAGOGASTRODUODENOSCOPY (EGD);  Surgeon: Erick Blinks, MD;  Location: [facility] ENDOSCOPY;  Service: Gastroenterology;  Laterality: N/A;  . Tonsillectomy    . Esophagogastroduodenoscopy  12/22/2012    Procedure: ESOPHAGOGASTRODUODENOSCOPY (EGD);  Surgeon: Shirley Friar, MD;  Location: [facility] ENDOSCOPY;  Service: Endoscopy;  Laterality: N/A;  . Colonoscopy  12/23/2012    Procedure: COLONOSCOPY;  Surgeon: Shirley Friar, MD;  Location: [facility] ENDOSCOPY;  Service: Endoscopy;  Laterality: N/A;  . Esophagogastroduodenoscopy N/A 09/06/2013    Procedure: ESOPHAGOGASTRODUODENOSCOPY (EGD);  Surgeon: [name], MD;  Location: [facility] ENDOSCOPY;  Service: Endoscopy;  Laterality: N/A;  bedside  . Colonoscopy N/A 09/10/2013    Procedure: COLONOSCOPY;  Surgeon: [name], MD;  Location: [facility] ENDOSCOPY;  Service: Endoscopy;  Laterality: N/A;    No family history on file.  Social History:  reports that he has never smoked. He has never used smokeless tobacco. He reports that he drinks about 3.6 ounces of alcohol per week. He reports that he does not use illicit drugs.  Allergies: No Known Allergies  Medications: Scheduled: Continuous:  No results found for this or any previous visit (from the past 24 hour(s)).   No  results found.  ROS:  As stated above in the HPI otherwise negative.  There were no vitals taken for this visit.    PE: Gen: NAD, Alert and Oriented HEENT:  [place]/AT, EOMI Neck: Supple, no LAD Lungs: CTA Bilaterally CV: RRR without M/G/R ABM: Soft, NTND, +BS Ext: No C/C/E  Assessment/Plan: 1) Esophageal varices.  Plan: 1) EGD with banding.  Joshua Waller D 11/07/2013, 9:35 AM

## 2013-11-08 ENCOUNTER — Encounter (HOSPITAL_COMMUNITY): Payer: Self-pay | Admitting: *Deleted

## 2013-11-08 ENCOUNTER — Ambulatory Visit (HOSPITAL_COMMUNITY)
Admission: RE | Admit: 2013-11-08 | Discharge: 2013-11-08 | Disposition: A | Discharge status: Home / Self Care | Payer: Medicare Other | Source: Ambulatory Visit | Attending: Gastroenterology | Admitting: Gastroenterology

## 2013-11-08 ENCOUNTER — Encounter (HOSPITAL_COMMUNITY)
Admission: RE | Disposition: A | Discharge status: Home / Self Care | Payer: Self-pay | Source: Ambulatory Visit | Attending: Gastroenterology

## 2013-11-08 DIAGNOSIS — K319 Disease of stomach and duodenum, unspecified: Secondary | ICD-10-CM | POA: Insufficient documentation

## 2013-11-08 DIAGNOSIS — D649 Anemia, unspecified: Secondary | ICD-10-CM | POA: Insufficient documentation

## 2013-11-08 DIAGNOSIS — I8500 Esophageal varices without bleeding: Secondary | ICD-10-CM | POA: Insufficient documentation

## 2013-11-08 DIAGNOSIS — K766 Portal hypertension: Secondary | ICD-10-CM | POA: Insufficient documentation

## 2013-11-08 HISTORY — DX: Unspecified convulsions: R56.9

## 2013-11-08 HISTORY — PX: ESOPHAGOGASTRODUODENOSCOPY: SHX5428

## 2013-11-08 SURGERY — EGD (ESOPHAGOGASTRODUODENOSCOPY)
Anesthesia: Moderate Sedation

## 2013-11-08 MED ORDER — MIDAZOLAM HCL 10 MG/2ML IJ SOLN
INTRAMUSCULAR | Status: AC
  Filled 2013-11-08: qty 2

## 2013-11-08 MED ORDER — FENTANYL CITRATE 0.05 MG/ML IJ SOLN
INTRAMUSCULAR | Status: DC | PRN
  Administered 2013-11-08 (×2): 25 ug via INTRAVENOUS

## 2013-11-08 MED ORDER — MIDAZOLAM HCL 10 MG/2ML IJ SOLN
INTRAMUSCULAR | Status: DC | PRN
  Administered 2013-11-08 (×2): 2 mg via INTRAVENOUS

## 2013-11-08 MED ORDER — FENTANYL CITRATE 0.05 MG/ML IJ SOLN
INTRAMUSCULAR | Status: AC
  Filled 2013-11-08: qty 2

## 2013-11-08 MED ORDER — SODIUM CHLORIDE 0.9 % IV SOLN
INTRAVENOUS | Status: DC
  Administered 2013-11-08: 500 mL via INTRAVENOUS

## 2013-11-08 MED ORDER — BUTAMBEN-TETRACAINE-BENZOCAINE 2-2-14 % EX AERO
INHALATION_SPRAY | CUTANEOUS | Status: DC | PRN
  Administered 2013-11-08: 2 via TOPICAL

## 2013-11-08 NOTE — Op Note (Signed)
[facility] [address] [place], [address]   OPERATIVE PROCEDURE REPORT  PATIENT: Joshua Waller, Joshua Waller  MR#: [id number] BIRTHDATE: 28-Mar-1951  GENDER: Male ENDOSCOPIST: Jeani Hawking, MD ASSISTANT:   Kandice Robinsons, technician and Dwain Sarna, RN CGRN PROCEDURE DATE: 11/08/2013 PROCEDURE:   EGD ASA CLASS:   III INDICATIONS:History of esophageal variceal bleed MEDICATIONS: Fentanyl 75 mcg IV and Versed 8 mg IV TOPICAL ANESTHETIC:   none  DESCRIPTION OF PROCEDURE:   After the risks benefits and alternatives of the procedure were thoroughly explained, informed consent was obtained.  The Pentax Gastroscope [id number]  endoscope was introduced through the mouth  and advanced to the second portion of the duodenum Without limitations.      The instrument was slowly withdrawn as the mucosa was fully examined.      FINDINGS: In the distal esophagus there was a small sliding hiatal hernia.  No clear evidence of any varices at this point versus very small distal esophageal varices.  The gastric mucosa exhibited portal HTN gastropathy and in the antrum some erythema was noted. No evidence of fundic varices.  The duodenum was normal. The scope was then withdrawn from the patient and the procedure terminated.  COMPLICATIONS: There were no complications. IMPRESSION: 1) Portal HTN gastropathy.  RECOMMENDATIONS: 1) Follow up in the office 3 months. 2) No alcohol.  _______________________________ Rosalie DoctorJeani Hawking, MD 11/08/2013 12:57 PM

## 2013-11-11 ENCOUNTER — Encounter (HOSPITAL_COMMUNITY): Payer: Self-pay | Admitting: Gastroenterology

## 2014-02-27 ENCOUNTER — Encounter (HOSPITAL_COMMUNITY): Payer: Self-pay | Admitting: Emergency Medicine

## 2014-02-27 ENCOUNTER — Emergency Department (HOSPITAL_COMMUNITY)
Admission: EM | Admit: 2014-02-27 | Discharge: 2014-02-27 | Disposition: A | Discharge status: Home / Self Care | Payer: Medicare Other | Attending: Emergency Medicine | Admitting: Emergency Medicine

## 2014-02-27 ENCOUNTER — Emergency Department (HOSPITAL_COMMUNITY): Payer: Medicare Other

## 2014-02-27 DIAGNOSIS — Z8719 Personal history of other diseases of the digestive system: Secondary | ICD-10-CM | POA: Insufficient documentation

## 2014-02-27 DIAGNOSIS — S42202A Unspecified fracture of upper end of left humerus, initial encounter for closed fracture: Secondary | ICD-10-CM

## 2014-02-27 DIAGNOSIS — H409 Unspecified glaucoma: Secondary | ICD-10-CM | POA: Insufficient documentation

## 2014-02-27 DIAGNOSIS — G40909 Epilepsy, unspecified, not intractable, without status epilepticus: Secondary | ICD-10-CM | POA: Insufficient documentation

## 2014-02-27 DIAGNOSIS — W010XXA Fall on same level from slipping, tripping and stumbling without subsequent striking against object, initial encounter: Secondary | ICD-10-CM | POA: Insufficient documentation

## 2014-02-27 DIAGNOSIS — Z8711 Personal history of peptic ulcer disease: Secondary | ICD-10-CM | POA: Insufficient documentation

## 2014-02-27 DIAGNOSIS — G8929 Other chronic pain: Secondary | ICD-10-CM | POA: Insufficient documentation

## 2014-02-27 DIAGNOSIS — Y9289 Other specified places as the place of occurrence of the external cause: Secondary | ICD-10-CM | POA: Insufficient documentation

## 2014-02-27 DIAGNOSIS — S0990XA Unspecified injury of head, initial encounter: Secondary | ICD-10-CM | POA: Insufficient documentation

## 2014-02-27 DIAGNOSIS — Y9301 Activity, walking, marching and hiking: Secondary | ICD-10-CM | POA: Insufficient documentation

## 2014-02-27 DIAGNOSIS — S42209A Unspecified fracture of upper end of unspecified humerus, initial encounter for closed fracture: Secondary | ICD-10-CM | POA: Insufficient documentation

## 2014-02-27 DIAGNOSIS — Z862 Personal history of diseases of the blood and blood-forming organs and certain disorders involving the immune mechanism: Secondary | ICD-10-CM | POA: Insufficient documentation

## 2014-02-27 DIAGNOSIS — Z8739 Personal history of other diseases of the musculoskeletal system and connective tissue: Secondary | ICD-10-CM | POA: Insufficient documentation

## 2014-02-27 DIAGNOSIS — Z79899 Other long term (current) drug therapy: Secondary | ICD-10-CM | POA: Insufficient documentation

## 2014-02-27 MED ORDER — KETOROLAC TROMETHAMINE 60 MG/2ML IM SOLN
60.0000 mg | Freq: Once | INTRAMUSCULAR | Status: AC
  Administered 2014-02-27: 60 mg via INTRAMUSCULAR
  Filled 2014-02-27: qty 2

## 2014-02-27 MED ORDER — OXYCODONE-ACETAMINOPHEN 5-325 MG PO TABS
2.0000 | ORAL_TABLET | Freq: Once | ORAL | Status: AC
  Administered 2014-02-27: 2 via ORAL
  Filled 2014-02-27: qty 2

## 2014-02-27 MED ORDER — HYDROMORPHONE HCL PF 1 MG/ML IJ SOLN
1.0000 mg | Freq: Once | INTRAMUSCULAR | Status: AC
  Administered 2014-02-27: 1 mg via INTRAMUSCULAR
  Filled 2014-02-27: qty 1

## 2014-02-27 MED ORDER — OXYCODONE-ACETAMINOPHEN 5-325 MG PO TABS: 1.0000 | ORAL_TABLET | ORAL | Status: DC | PRN

## 2014-02-27 NOTE — ED Notes (Signed)
Per EMS, pt climbing hill and slipped in mud.  Pt fell to left side injuring left shoulder.  Bystander called 911.  Vitals: 120/76, hr 80, resp 18

## 2014-02-27 NOTE — Discharge Instructions (Signed)
Humerus Fracture, Treated with Immobilization °The humerus is the large bone in your upper arm. You have a broken (fractured) humerus. These fractures are easily diagnosed with X-rays. °TREATMENT  °Simple fractures which will heal without disability are treated with simple immobilization. Immobilization means you will wear a cast, splint, or sling. You have a fracture which will do well with immobilization. The fracture will heal well simply by being held in a good position until it is stable enough to begin range of motion exercises. Do not take part in activities which would further injure your arm.  °HOME CARE INSTRUCTIONS  °· Put ice on the injured area. °· Put ice in a plastic bag. °· Place a towel between your skin and the bag. °· Leave the ice on for 15-20 minutes, 03-04 times a day. °· If you have a cast: °· Do not scratch the skin under the cast using sharp or pointed objects. °· Check the skin around the cast every day. You may put lotion on any red or sore areas. °· Keep your cast dry and clean. °· If you have a splint: °· Wear the splint as directed. °· Keep your splint dry and clean. °· You may loosen the elastic around the splint if your fingers become numb, tingle, or turn cold or blue. °· If you have a sling: °· Wear the sling as directed. °· Do not put pressure on any part of your cast or splint until it is fully hardened. °· Your cast or splint can be protected during bathing with a plastic bag. Do not lower the cast or splint into water. °· Only take over-the-counter or prescription medicines for pain, discomfort, or fever as directed by your caregiver. °· Do range of motion exercises as instructed by your caregiver. °· Follow up as directed by your caregiver. This is very important in order to avoid permanent injury or disability and chronic pain. °SEEK IMMEDIATE MEDICAL CARE IF:  °· Your skin or nails in the injured arm turn blue or gray. °· Your arm feels cold or numb. °· You develop severe  pain in the injured arm. °· You are having problems with the medicines you were given. °MAKE SURE YOU:  °· Understand these instructions. °· Will watch your condition. °· Will get help right away if you are not doing well or get worse. °Document Released: 03/20/2001 Document Revised: 03/05/2012 Document Reviewed: 01/26/2011 °[employer]® Patient Information ©2014 [employer]. ° °

## 2014-02-27 NOTE — ED Provider Notes (Signed)
CSN: [id number]     Arrival date & time 02/27/14  1656 History   First MD Initiated Contact with Patient 02/27/14 1847     Chief Complaint  Patient presents with  . Fall  . Shoulder Pain      HPI Patient reports he was walking up a hill when he slipped in the modified and injured his left shoulder.  He states severe pain in his left proximal humerus.  He denies numbness and tingling and weakness of his left hand.  He had minor head injury without loss consciousness.  He is not on anticoagulants.  He has no headache at this time.  He denies neck pain.  No weakness of his upper lower extremities.  No abdominal complaints.  No chest pain.  No hip pain.  His pain is mild to moderate in severity located in his left shoulder.  He states his pain is worse with movement of his left shoulder and palpation.   Past Medical History  Diagnosis Date  . Lumbar disc disorder   . Anemia   . Neuropathy of lower extremity 2007    very poor balance  . Glaucoma (increased eye pressure)     bilateral  . Gastric ulcer   . Chronic back pain   . Alcohol abuse   . 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 Gastritis   . GI bleeding   . Seizures     unknown type  . Arthritis     back & neck   Past Surgical History  Procedure Laterality Date  . Esophagogastroduodenoscopy  12/29/2011    Procedure: ESOPHAGOGASTRODUODENOSCOPY (EGD);  Surgeon: Zenovia Jarred, MD;  Location: [facility] ENDOSCOPY;  Service: Gastroenterology;  Laterality: N/A;  . Tonsillectomy    . Esophagogastroduodenoscopy  12/22/2012    Procedure: ESOPHAGOGASTRODUODENOSCOPY (EGD);  Surgeon: Lear Ng, MD;  Location: [facility] ENDOSCOPY;  Service: Endoscopy;  Laterality: N/A;  . Colonoscopy  12/23/2012    Procedure: COLONOSCOPY;  Surgeon: Lear Ng, MD;  Location: [facility] ENDOSCOPY;  Service: Endoscopy;  Laterality: N/A;  . Esophagogastroduodenoscopy N/A 09/06/2013    Procedure: ESOPHAGOGASTRODUODENOSCOPY (EGD);  Surgeon: Beryle Beams, MD;  Location: [facility]  ENDOSCOPY;  Service: Endoscopy;  Laterality: N/A;  bedside  . Colonoscopy N/A 09/10/2013    Procedure: COLONOSCOPY;  Surgeon: Beryle Beams, MD;  Location: [facility] ENDOSCOPY;  Service: Endoscopy;  Laterality: N/A;  . Esophagogastroduodenoscopy N/A 11/08/2013    Procedure: ESOPHAGOGASTRODUODENOSCOPY (EGD);  Surgeon: Beryle Beams, MD;  Location: [facility] ENDOSCOPY;  Service: Endoscopy;  Laterality: N/A;   No family history on file. History  Substance Use Topics  . Smoking status: Never Smoker   . Smokeless tobacco: Never Used  . Alcohol Use: 3.6 oz/week    6 Cans of beer per week     Comment: heavy drinker    Review of Systems  All other systems reviewed and are negative.      Allergies  Review of patient's allergies indicates no known allergies.  Home Medications   Current Outpatient Rx  Name  Route  Sig  Dispense  Refill  . gabapentin (NEURONTIN) 600 MG tablet   Oral   Take 600 mg by mouth 3 (three) times daily.         . traMADol (ULTRAM) 50 MG tablet   Oral   Take 100 mg by mouth 2 (two) times daily.          Marland Kitchen oxyCODONE-acetaminophen (PERCOCET/ROXICET) 5-325 MG per tablet   Oral   Take 1 tablet by  mouth every 4 (four) hours as needed for severe pain.   30 tablet   0    BP 148/78  Pulse 78  Temp(Src) 97.7 F (36.5 C) (Oral)  Resp 16  SpO2 95% Physical Exam  Nursing note and vitals reviewed. Constitutional: He is oriented to person, place, and time. He appears well-developed and well-nourished.  HENT:  Head: Normocephalic and atraumatic.  Eyes: EOM are normal.  Neck: Normal range of motion.  C-spine nontender.  C-spine cleared by Nexus criteria  Cardiovascular: Normal rate, regular rhythm, normal heart sounds and intact distal pulses.   Pulmonary/Chest: Effort normal and breath sounds normal. No respiratory distress.  Abdominal: Soft. He exhibits no distension. There is no tenderness.  Musculoskeletal: Normal range of motion.  Mild bruising and swelling of  his left proximal humerus with tenderness in this region. no left a.c. Tenderness.  Neurological: He is alert and oriented to person, place, and time.  Skin: Skin is warm and dry.  Psychiatric: He has a normal mood and affect. Judgment normal.    ED Course  Procedures (including critical care time) Labs Review Labs Reviewed - No data to display Imaging Review Dg Shoulder Left  02/27/2014   CLINICAL DATA:  Fall, shoulder pain.  EXAM: LEFT SHOULDER - 2+ VIEW  COMPARISON:  None available for comparison at time of study interpretation.  FINDINGS: Comminuted humeral head fracture extending from the surgical neck to the greater tuberosity with impaction, slight lateral angulation of distal bony fragment. No dislocation.  Bone mineral density is decreased without destructive bony lesions. Periarticular soft tissue planes are nonsuspicious.  IMPRESSION: Comminuted impacted mildly angulated left humeral head fracture without dislocation.   Electronically Signed   By: Elon Alas   On: 02/27/2014 17:55  I personally reviewed the imaging tests through PACS system I reviewed available ER/hospitalization records through the EMR    EKG Interpretation None      MDM   Final diagnoses:  Fracture of humerus, proximal, left, closed    Isolated left proximal humerus fracture.  Pain treated in the emergency department.  Placed in left shoulder immobilizer.  Orthopedic followup.  He understands to return to the ER for new or worsening symptoms    Hoy Morn, MD 02/27/14 2019

## 2015-03-12 ENCOUNTER — Ambulatory Visit (HOSPITAL_COMMUNITY): Admission: RE | Admit: 2015-03-12 | Payer: Self-pay | Source: Ambulatory Visit

## 2015-03-12 ENCOUNTER — Other Ambulatory Visit (HOSPITAL_BASED_OUTPATIENT_CLINIC_OR_DEPARTMENT_OTHER): Payer: Self-pay | Admitting: Family Medicine

## 2015-03-12 DIAGNOSIS — M7989 Other specified soft tissue disorders: Secondary | ICD-10-CM

## 2015-03-13 ENCOUNTER — Ambulatory Visit (HOSPITAL_COMMUNITY)
Admission: RE | Admit: 2015-03-13 | Discharge: 2015-03-13 | Disposition: A | Discharge status: Home / Self Care | Payer: Medicare Other | Source: Ambulatory Visit | Attending: Family Medicine | Admitting: Family Medicine

## 2015-03-13 DIAGNOSIS — M7989 Other specified soft tissue disorders: Secondary | ICD-10-CM | POA: Diagnosis not present

## 2015-03-13 DIAGNOSIS — I82411 Acute embolism and thrombosis of right femoral vein: Secondary | ICD-10-CM | POA: Diagnosis not present

## 2015-03-13 NOTE — Progress Notes (Signed)
Preliminary Findings: Right lower extremity venous duplex completed. No evidence for SVT or a Baker's cyst. There is evidence for acute DVT in the right proximal, mid, and distal superficial femoral vein. Christy Gentles, PA-C, was notified of results and spoke with Dr. Arelia Sneddon to determine treatment. [place]

## 2015-09-02 ENCOUNTER — Inpatient Hospital Stay (HOSPITAL_COMMUNITY)
Admission: EM | Admit: 2015-09-02 | Discharge: 2015-09-10 | DRG: 871 | Disposition: A | Discharge status: Skilled Nursing Facility | Payer: Medicare Other | Attending: Internal Medicine | Admitting: Internal Medicine

## 2015-09-02 ENCOUNTER — Encounter (HOSPITAL_COMMUNITY): Payer: Self-pay

## 2015-09-02 DIAGNOSIS — Z8249 Family history of ischemic heart disease and other diseases of the circulatory system: Secondary | ICD-10-CM

## 2015-09-02 DIAGNOSIS — R945 Abnormal results of liver function studies: Secondary | ICD-10-CM

## 2015-09-02 DIAGNOSIS — W19XXXA Unspecified fall, initial encounter: Secondary | ICD-10-CM | POA: Diagnosis present

## 2015-09-02 DIAGNOSIS — D151 Benign neoplasm of heart: Secondary | ICD-10-CM | POA: Diagnosis present

## 2015-09-02 DIAGNOSIS — A419 Sepsis, unspecified organism: Secondary | ICD-10-CM

## 2015-09-02 DIAGNOSIS — Z79899 Other long term (current) drug therapy: Secondary | ICD-10-CM

## 2015-09-02 DIAGNOSIS — M25511 Pain in right shoulder: Secondary | ICD-10-CM

## 2015-09-02 DIAGNOSIS — A4101 Sepsis due to Methicillin susceptible Staphylococcus aureus: Principal | ICD-10-CM | POA: Diagnosis present

## 2015-09-02 DIAGNOSIS — R778 Other specified abnormalities of plasma proteins: Secondary | ICD-10-CM

## 2015-09-02 DIAGNOSIS — R55 Syncope and collapse: Secondary | ICD-10-CM | POA: Diagnosis present

## 2015-09-02 DIAGNOSIS — R9389 Abnormal findings on diagnostic imaging of other specified body structures: Secondary | ICD-10-CM | POA: Diagnosis present

## 2015-09-02 DIAGNOSIS — R7989 Other specified abnormal findings of blood chemistry: Secondary | ICD-10-CM | POA: Diagnosis present

## 2015-09-02 DIAGNOSIS — R531 Weakness: Secondary | ICD-10-CM

## 2015-09-02 DIAGNOSIS — G8929 Other chronic pain: Secondary | ICD-10-CM | POA: Diagnosis present

## 2015-09-02 DIAGNOSIS — G9340 Encephalopathy, unspecified: Secondary | ICD-10-CM | POA: Diagnosis present

## 2015-09-02 DIAGNOSIS — E872 Acidosis, unspecified: Secondary | ICD-10-CM

## 2015-09-02 DIAGNOSIS — M25512 Pain in left shoulder: Secondary | ICD-10-CM

## 2015-09-02 DIAGNOSIS — R161 Splenomegaly, not elsewhere classified: Secondary | ICD-10-CM | POA: Diagnosis present

## 2015-09-02 DIAGNOSIS — K7031 Alcoholic cirrhosis of liver with ascites: Secondary | ICD-10-CM | POA: Insufficient documentation

## 2015-09-02 DIAGNOSIS — F1010 Alcohol abuse, uncomplicated: Secondary | ICD-10-CM | POA: Diagnosis present

## 2015-09-02 DIAGNOSIS — Z8719 Personal history of other diseases of the digestive system: Secondary | ICD-10-CM

## 2015-09-02 DIAGNOSIS — M7989 Other specified soft tissue disorders: Secondary | ICD-10-CM

## 2015-09-02 DIAGNOSIS — S2241XA Multiple fractures of ribs, right side, initial encounter for closed fracture: Secondary | ICD-10-CM | POA: Diagnosis present

## 2015-09-02 DIAGNOSIS — D696 Thrombocytopenia, unspecified: Secondary | ICD-10-CM | POA: Diagnosis present

## 2015-09-02 DIAGNOSIS — G319 Degenerative disease of nervous system, unspecified: Secondary | ICD-10-CM | POA: Diagnosis present

## 2015-09-02 DIAGNOSIS — E46 Unspecified protein-calorie malnutrition: Secondary | ICD-10-CM | POA: Diagnosis present

## 2015-09-02 DIAGNOSIS — Z9181 History of falling: Secondary | ICD-10-CM

## 2015-09-02 DIAGNOSIS — E871 Hypo-osmolality and hyponatremia: Secondary | ICD-10-CM | POA: Diagnosis present

## 2015-09-02 DIAGNOSIS — E876 Hypokalemia: Secondary | ICD-10-CM | POA: Diagnosis present

## 2015-09-02 DIAGNOSIS — E860 Dehydration: Secondary | ICD-10-CM | POA: Diagnosis present

## 2015-09-02 DIAGNOSIS — G629 Polyneuropathy, unspecified: Secondary | ICD-10-CM

## 2015-09-02 DIAGNOSIS — R197 Diarrhea, unspecified: Secondary | ICD-10-CM | POA: Diagnosis present

## 2015-09-02 DIAGNOSIS — Z8711 Personal history of peptic ulcer disease: Secondary | ICD-10-CM

## 2015-09-02 DIAGNOSIS — K7460 Unspecified cirrhosis of liver: Secondary | ICD-10-CM | POA: Diagnosis present

## 2015-09-02 DIAGNOSIS — I248 Other forms of acute ischemic heart disease: Secondary | ICD-10-CM | POA: Diagnosis present

## 2015-09-02 DIAGNOSIS — M549 Dorsalgia, unspecified: Secondary | ICD-10-CM | POA: Diagnosis present

## 2015-09-02 DIAGNOSIS — Z6829 Body mass index (BMI) 29.0-29.9, adult: Secondary | ICD-10-CM

## 2015-09-02 DIAGNOSIS — Y92009 Unspecified place in unspecified non-institutional (private) residence as the place of occurrence of the external cause: Secondary | ICD-10-CM

## 2015-09-02 DIAGNOSIS — R29898 Other symptoms and signs involving the musculoskeletal system: Secondary | ICD-10-CM | POA: Diagnosis present

## 2015-09-02 DIAGNOSIS — R748 Abnormal levels of other serum enzymes: Secondary | ICD-10-CM

## 2015-09-02 DIAGNOSIS — A412 Sepsis due to unspecified staphylococcus: Secondary | ICD-10-CM | POA: Diagnosis present

## 2015-09-02 DIAGNOSIS — R7881 Bacteremia: Secondary | ICD-10-CM | POA: Diagnosis present

## 2015-09-02 DIAGNOSIS — R188 Other ascites: Secondary | ICD-10-CM

## 2015-09-02 DIAGNOSIS — E261 Secondary hyperaldosteronism: Secondary | ICD-10-CM | POA: Diagnosis present

## 2015-09-02 DIAGNOSIS — R739 Hyperglycemia, unspecified: Secondary | ICD-10-CM

## 2015-09-02 DIAGNOSIS — B9561 Methicillin susceptible Staphylococcus aureus infection as the cause of diseases classified elsewhere: Secondary | ICD-10-CM | POA: Diagnosis present

## 2015-09-02 DIAGNOSIS — R9431 Abnormal electrocardiogram [ECG] [EKG]: Secondary | ICD-10-CM | POA: Insufficient documentation

## 2015-09-02 DIAGNOSIS — N390 Urinary tract infection, site not specified: Secondary | ICD-10-CM | POA: Diagnosis present

## 2015-09-02 DIAGNOSIS — R52 Pain, unspecified: Secondary | ICD-10-CM

## 2015-09-02 DIAGNOSIS — D72819 Decreased white blood cell count, unspecified: Secondary | ICD-10-CM | POA: Diagnosis present

## 2015-09-02 DIAGNOSIS — K259 Gastric ulcer, unspecified as acute or chronic, without hemorrhage or perforation: Secondary | ICD-10-CM | POA: Diagnosis present

## 2015-09-02 DIAGNOSIS — I82401 Acute embolism and thrombosis of unspecified deep veins of right lower extremity: Secondary | ICD-10-CM | POA: Diagnosis present

## 2015-09-02 DIAGNOSIS — S80212A Abrasion, left knee, initial encounter: Secondary | ICD-10-CM | POA: Diagnosis present

## 2015-09-02 DIAGNOSIS — M6282 Rhabdomyolysis: Secondary | ICD-10-CM | POA: Diagnosis present

## 2015-09-02 DIAGNOSIS — S51811A Laceration without foreign body of right forearm, initial encounter: Secondary | ICD-10-CM | POA: Diagnosis present

## 2015-09-02 DIAGNOSIS — I82409 Acute embolism and thrombosis of unspecified deep veins of unspecified lower extremity: Secondary | ICD-10-CM

## 2015-09-02 DIAGNOSIS — K7030 Alcoholic cirrhosis of liver without ascites: Secondary | ICD-10-CM | POA: Diagnosis present

## 2015-09-02 MED ORDER — SODIUM CHLORIDE 0.9 % IV BOLUS (SEPSIS)
1000.0000 mL | Freq: Once | INTRAVENOUS | Status: AC
  Administered 2015-09-02: 1000 mL via INTRAVENOUS

## 2015-09-02 MED ORDER — ACETAMINOPHEN 325 MG PO TABS
650.0000 mg | ORAL_TABLET | Freq: Once | ORAL | Status: AC
  Administered 2015-09-02: 650 mg via ORAL
  Filled 2015-09-02: qty 2

## 2015-09-02 NOTE — ED Provider Notes (Addendum)
CSN: [id number]     Arrival date & time 09/02/15  2118 History   First MD Initiated Contact with Patient 09/02/15 2135     Chief Complaint  Patient presents with  . Peripheral Neuropathy    Increased pain due to neuropathy - unable to ambulate x 3 days.      (Consider location/radiation/quality/duration/timing/severity/associated sxs/prior Treatment) HPI Comments: Poor historian, tangential history, distracted hx. Reports Louie Casa ("the guy who owns the place" wanted him to come to the ED "he's lying to the police about me stealing his beer")  Initially reported in triage here for worsening peripheral neuropathy EMS reported pt not ambulating at home, falls including fall today. On my evaluation, reports here for "bowels" , problems with bowels, diarrhea for 2 days, sometimes cannot make it to bathroom on time given chronic issues with balance/neuropathy.  Reports neuropathy and back pain unchanged for years, and falls over last several months with chronic poor balance.  Reports he wears depends intermittently for years, and has intermittent incontinence because "i just can't make it in time."  Denies other infectious symptoms other than urinary frequency, chronic mild cough.    Reports drinks ETOH, no hx of withdrawal.  Pt denies fall in the last 2 days, however EMS reports fall with pt laying between floor and couch and not moving around for last 3 days    Patient is a 64 y.o. male presenting with diarrhea. The history is provided by the patient and medical records.  Diarrhea Severity:  Moderate Onset quality:  Gradual Number of episodes:  Reports 3 episodes/day Duration:  2 days Timing:  Constant Progression:  Worsening Relieved by:  Nothing Associated symptoms: fever (denies, however had on arrival to ED)   Associated symptoms: no chills, no headaches and no vomiting     Past Medical History  Diagnosis Date  . Lumbar disc disorder   . Anemia   . Neuropathy of lower extremity  2007    very poor balance  . Glaucoma (increased eye pressure)     bilateral  . Gastric ulcer   . Chronic back pain   . Alcohol abuse   . 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 Gastritis   . GI bleeding   . Seizures     unknown type  . Arthritis     back & neck   Past Surgical History  Procedure Laterality Date  . Esophagogastroduodenoscopy  12/29/2011    Procedure: ESOPHAGOGASTRODUODENOSCOPY (EGD);  Surgeon: Zenovia Jarred, MD;  Location: [facility] ENDOSCOPY;  Service: Gastroenterology;  Laterality: N/A;  . Tonsillectomy    . Esophagogastroduodenoscopy  12/22/2012    Procedure: ESOPHAGOGASTRODUODENOSCOPY (EGD);  Surgeon: Lear Ng, MD;  Location: [facility] ENDOSCOPY;  Service: Endoscopy;  Laterality: N/A;  . Colonoscopy  12/23/2012    Procedure: COLONOSCOPY;  Surgeon: Lear Ng, MD;  Location: [facility] ENDOSCOPY;  Service: Endoscopy;  Laterality: N/A;  . Esophagogastroduodenoscopy N/A 09/06/2013    Procedure: ESOPHAGOGASTRODUODENOSCOPY (EGD);  Surgeon: Beryle Beams, MD;  Location: [facility] ENDOSCOPY;  Service: Endoscopy;  Laterality: N/A;  bedside  . Colonoscopy N/A 09/10/2013    Procedure: COLONOSCOPY;  Surgeon: Beryle Beams, MD;  Location: [facility] ENDOSCOPY;  Service: Endoscopy;  Laterality: N/A;  . Esophagogastroduodenoscopy N/A 11/08/2013    Procedure: ESOPHAGOGASTRODUODENOSCOPY (EGD);  Surgeon: Beryle Beams, MD;  Location: [facility] ENDOSCOPY;  Service: Endoscopy;  Laterality: N/A;   No family history on file. Social History  Substance Use Topics  . Smoking status: Never Smoker   . Smokeless tobacco: Never  Used  . Alcohol Use: 3.6 oz/week    6 Cans of beer per week     Comment: heavy drinker    Review of Systems  Constitutional: Positive for fever (denies, however had on arrival to ED), appetite change and fatigue. Negative for chills.  HENT: Negative for congestion.   Eyes: Negative for visual disturbance.  Respiratory: Positive for cough (mild chronic). Negative for shortness of breath.    Cardiovascular: Negative for chest pain and leg swelling.  Gastrointestinal: Positive for diarrhea (x2days). Negative for nausea, vomiting, constipation and blood in stool.  Genitourinary: Positive for frequency. Negative for difficulty urinating.  Musculoskeletal: Positive for back pain and gait problem.  Skin: Negative for rash.  Neurological: Positive for numbness (chronic tingling/burning pain). Negative for tremors, syncope, facial asymmetry, speech difficulty, weakness and headaches.      Allergies  Aspirin  Home Medications   Prior to Admission medications   Medication Sig Start Date End Date Taking? Authorizing Provider  gabapentin (NEURONTIN) 600 MG tablet Take 600 mg by mouth 3 (three) times daily.   Yes Historical Provider, MD  traMADol (ULTRAM) 50 MG tablet Take 100 mg by mouth 2 (two) times daily.    Yes Historical Provider, MD  oxyCODONE-acetaminophen (PERCOCET/ROXICET) 5-325 MG per tablet Take 1 tablet by mouth every 4 (four) hours as needed for severe pain. Patient not taking: Reported on 09/02/2015 02/27/14   Jola Schmidt, MD   BP 119/67 mmHg  Pulse 102  Temp(Src) 100.8 F (38.2 C) (Rectal)  Resp 22  Ht 5\' 9"  (1.753 m)  Wt 200 lb (90.719 kg)  BMI 29.52 kg/m2  SpO2 95% Physical Exam  Constitutional: He is oriented to person, place, and time. He appears well-developed and well-nourished. No distress.  HENT:  Head: Normocephalic and atraumatic.  Mouth/Throat: Mucous membranes are dry. No oropharyngeal exudate.  Eyes: Conjunctivae and EOM are normal. Pupils are equal, round, and reactive to light. Right eye exhibits discharge. Left eye exhibits discharge.  Neck: Normal range of motion.  Cardiovascular: Regular rhythm, normal heart sounds and intact distal pulses.  Tachycardia present.  Exam reveals no gallop and no friction rub.   No murmur heard. Pulmonary/Chest: Effort normal and breath sounds normal. No respiratory distress. He has no wheezes. He has no rales.   Abdominal: Soft. He exhibits no distension. There is no tenderness. There is no guarding.  Musculoskeletal: He exhibits no edema.  Contusion L knee Mild erythema/scratches/tenderness over right knee Able to flex and extend bilateral LE against pressure however slow to do so  Neurological: He is alert and oriented to person, place, and time. GCS eye subscore is 4. GCS verbal subscore is 5. GCS motor subscore is 6.  Oriented x3, however tangential with history, easily distracted  Skin: Skin is warm and dry. No rash noted. He is not diaphoretic.  Mild erythema bilateral upper legs (irritation from wiping per pt), contusion left knee, healing scar right forehead   Nursing note and vitals reviewed.   ED Course  Procedures (including critical care time) Labs Review Labs Reviewed  CBC WITH DIFFERENTIAL/PLATELET - Abnormal; Notable for the following:    Platelets 46 (*)    Neutrophils Relative % 82 (*)    Lymphocytes Relative 9 (*)    Lymphs Abs 0.5 (*)    All other components within normal limits  COMPREHENSIVE METABOLIC PANEL - Abnormal; Notable for the following:    Sodium 127 (*)    Chloride 93 (*)    CO2 21 (*)  Glucose, Bld 174 (*)    BUN 21 (*)    Calcium 8.7 (*)    Albumin 3.0 (*)    AST 206 (*)    ALT 92 (*)    Alkaline Phosphatase 31 (*)    Total Bilirubin 5.3 (*)    All other components within normal limits  URINALYSIS, ROUTINE W REFLEX MICROSCOPIC (NOT AT [facility]) - Abnormal; Notable for the following:    Color, Urine ORANGE (*)    APPearance CLOUDY (*)    Hgb urine dipstick LARGE (*)    Bilirubin Urine SMALL (*)    Ketones, ur 40 (*)    Protein, ur 30 (*)    Nitrite POSITIVE (*)    Leukocytes, UA SMALL (*)    All other components within normal limits  CK - Abnormal; Notable for the following:    Total CK 3381 (*)    All other components within normal limits  URINE MICROSCOPIC-ADD ON - Abnormal; Notable for the following:    Bacteria, UA MANY (*)    All other  components within normal limits  I-STAT CG4 LACTIC ACID, ED - Abnormal; Notable for the following:    Lactic Acid, Venous 3.56 (*)    All other components within normal limits  URINE CULTURE  CULTURE, BLOOD (ROUTINE X 2)  CULTURE, BLOOD (ROUTINE X 2)  TROPONIN I  AMMONIA  I-STAT CG4 LACTIC ACID, ED    Imaging Review Dg Chest 2 View  09/03/2015   CLINICAL DATA:  64 year old male with fall and fever.  EXAM: CHEST  2 VIEW  COMPARISON:  Chest radiograph dated 09/25/2013 all  FINDINGS: There is widening of the cardiomediastinal silhouette, significantly increased from prior study. This may be partially related to shallow inspiratory effort and imaging technique. Underlying Traumatic aortic or mediastinal injury is not excluded. Clinical correlation recommended. CT with contrast may provide better evaluation there is clinical concern for traumatic aortic or mediastinal injury. There are bibasilar linear atelectatic changes. There is no focal consolidation, pleural effusion, or pneumothorax. There are degenerative changes of the shoulders. No acute fracture.  IMPRESSION: Enlarged cardiomediastinal silhouette. Underlying traumatic mediastinal injury is not excluded. Clinical correlation is recommended. CT of the chest may provide better evaluation if clinically indicated.  No focal consolidation.  These results were called by telephone at the time of interpretation on 09/03/2015 at 1:09 am to Dr. Gareth Morgan , who verbally acknowledged these results.   Electronically Signed   By: Anner Crete M.D.   On: 09/03/2015 01:09   I have personally reviewed and evaluated these images and lab results as part of my medical decision-making.   EKG Interpretation   Date/Time:  Thursday September 03 2015 01:16:26 EDT Ventricular Rate:  100 PR Interval:  155 QRS Duration: 87 QT Interval:  414 QTC Calculation: 534 R Axis:   46 Text Interpretation:  Sinus tachycardia Low voltage, extremity leads  Prolonged  QT interval Similar to prior EKGs Rate has increased in EKGs  today compared to Oct 2014 Confirmed by [facility] MD, Ewing ([id number]) on  09/03/2015 2:06:36 AM      MDM   Final diagnoses:  UTI (lower urinary tract infection)  Sepsis, due to unspecified organism  Abnormal LFTs  Acute encephalopathy  Alcoholic cirrhosis of liver without ascites  Thrombocytopenia  Generalized weakness  Lactic acidosis   64 year old male with a history of alcohol abuse, cirrhosis, history of GI bleed, chronic bilateral lower extremity neuropathy with history of frequent falls, presents with concern of  increasing falls over the last 2 months, continuing bilateral lower extremity pain, diarrhea over the last 2 days, and fever on arrival to the emergency department. Patient is a poor historian, however denies worsening back pain, worsening weakness, chest pain, shortness of breath, abdominal pain, n/v.  Patient is tachycardic to 110, febrile to 101.4 and normotensive. His lactate is elevated to 3.6.    Chest x-ray done which does not show signs of pneumonia definitively, however shows cardiomegaly and mediastinum wider than previous chest x-ray. Patient denies any significant falls, chest pain or shortness of breath and have low suspicion for traumatic aortic transection, he has no chest pain or known new neurologic deficits and doubt aortic dissection, and no history of emesis to suggest Boerhaave's.    Low suspicion for epidural abscess given chronicity of lumbar back and lower ext neuropathy symptoms as well as pt reporting no new bowel incontinence or urinary retention or worsened weakness with generalized weakness noted on exam.    Urinalysis concerning for UTI and pt reports urinary frequency.   Labs show sodium of 127, chloride of 93, bicarbonate 21 AST of 26, ALP 92. Bili of 5.3. Patient without acute abdominal pain or tenderness on exam and have low suspicion that elevations in his transaminases or bilirubin  represent on acute biliary obstruction at this time.  Given febrile with lactic acidosis, empiric vancomycin and zosyn ordered for sepsis.  Given 3L NS with improvement in HR.  Pt most likely with sepsis secondary to urinary source with dehydration, worsening chronic liver disease.  CT head ordered given thrombocytopenia. Pt to be admitted to hospitalist stepdown for further care.     Gareth Morgan, MD 09/03/15 [phone]

## 2015-09-02 NOTE — ED Notes (Signed)
Pt hygiene was performed due to stool all over patient. Sheets and incontinence pad changed.

## 2015-09-02 NOTE — ED Notes (Addendum)
Pt transported by [facility] from home.  Patient has been unable to ambulate due to neuropathy and has been laying between floor and sofa for the last three days.  Pt has been unable to ambulate to bathroom and has minimal help at home. Pt soiled with stool.  Pt's roommate reported to EMS that patient has been drinking excessively recently and has not been taking his medications. Pt reports that he normally takes neurontin but stopped taking recently for "quite a while" because it made him sick.  Pt states he began taking neurontin again this morning.  Patient states he was attempting to walk to bathroom this evening approx 4hrs ago and fell onto right side.  Patient c/o pain to right arm and right side of body.

## 2015-09-02 NOTE — ED Notes (Signed)
Bed: JJ00 Expected date:  Expected time:  Means of arrival:  Comments: neuropathy

## 2015-09-03 ENCOUNTER — Inpatient Hospital Stay (HOSPITAL_COMMUNITY): Payer: Medicare Other

## 2015-09-03 ENCOUNTER — Encounter (HOSPITAL_COMMUNITY): Payer: Self-pay

## 2015-09-03 ENCOUNTER — Emergency Department (HOSPITAL_COMMUNITY): Payer: Medicare Other

## 2015-09-03 DIAGNOSIS — G9340 Encephalopathy, unspecified: Secondary | ICD-10-CM | POA: Diagnosis present

## 2015-09-03 DIAGNOSIS — E871 Hypo-osmolality and hyponatremia: Secondary | ICD-10-CM | POA: Diagnosis present

## 2015-09-03 DIAGNOSIS — T796XXA Traumatic ischemia of muscle, initial encounter: Secondary | ICD-10-CM

## 2015-09-03 DIAGNOSIS — S80212A Abrasion, left knee, initial encounter: Secondary | ICD-10-CM | POA: Diagnosis present

## 2015-09-03 DIAGNOSIS — G8929 Other chronic pain: Secondary | ICD-10-CM | POA: Diagnosis present

## 2015-09-03 DIAGNOSIS — W19XXXA Unspecified fall, initial encounter: Secondary | ICD-10-CM | POA: Diagnosis present

## 2015-09-03 DIAGNOSIS — R161 Splenomegaly, not elsewhere classified: Secondary | ICD-10-CM | POA: Diagnosis present

## 2015-09-03 DIAGNOSIS — A4901 Methicillin susceptible Staphylococcus aureus infection, unspecified site: Secondary | ICD-10-CM | POA: Diagnosis not present

## 2015-09-03 DIAGNOSIS — R7989 Other specified abnormal findings of blood chemistry: Secondary | ICD-10-CM | POA: Diagnosis not present

## 2015-09-03 DIAGNOSIS — N390 Urinary tract infection, site not specified: Secondary | ICD-10-CM | POA: Diagnosis present

## 2015-09-03 DIAGNOSIS — M7989 Other specified soft tissue disorders: Secondary | ICD-10-CM | POA: Diagnosis not present

## 2015-09-03 DIAGNOSIS — M6282 Rhabdomyolysis: Secondary | ICD-10-CM | POA: Diagnosis present

## 2015-09-03 DIAGNOSIS — R945 Abnormal results of liver function studies: Secondary | ICD-10-CM | POA: Diagnosis present

## 2015-09-03 DIAGNOSIS — K08409 Partial loss of teeth, unspecified cause, unspecified class: Secondary | ICD-10-CM | POA: Diagnosis not present

## 2015-09-03 DIAGNOSIS — Z8249 Family history of ischemic heart disease and other diseases of the circulatory system: Secondary | ICD-10-CM | POA: Diagnosis not present

## 2015-09-03 DIAGNOSIS — Z9181 History of falling: Secondary | ICD-10-CM | POA: Diagnosis not present

## 2015-09-03 DIAGNOSIS — E860 Dehydration: Secondary | ICD-10-CM | POA: Diagnosis present

## 2015-09-03 DIAGNOSIS — B9561 Methicillin susceptible Staphylococcus aureus infection as the cause of diseases classified elsewhere: Secondary | ICD-10-CM | POA: Diagnosis not present

## 2015-09-03 DIAGNOSIS — Z8711 Personal history of peptic ulcer disease: Secondary | ICD-10-CM | POA: Diagnosis not present

## 2015-09-03 DIAGNOSIS — G629 Polyneuropathy, unspecified: Secondary | ICD-10-CM | POA: Diagnosis present

## 2015-09-03 DIAGNOSIS — K088 Other specified disorders of teeth and supporting structures: Secondary | ICD-10-CM | POA: Diagnosis not present

## 2015-09-03 DIAGNOSIS — S2241XA Multiple fractures of ribs, right side, initial encounter for closed fracture: Secondary | ICD-10-CM | POA: Diagnosis present

## 2015-09-03 DIAGNOSIS — R8299 Other abnormal findings in urine: Secondary | ICD-10-CM | POA: Diagnosis not present

## 2015-09-03 DIAGNOSIS — F1010 Alcohol abuse, uncomplicated: Secondary | ICD-10-CM | POA: Diagnosis present

## 2015-09-03 DIAGNOSIS — K7460 Unspecified cirrhosis of liver: Secondary | ICD-10-CM | POA: Diagnosis not present

## 2015-09-03 DIAGNOSIS — R7881 Bacteremia: Secondary | ICD-10-CM | POA: Diagnosis not present

## 2015-09-03 DIAGNOSIS — D151 Benign neoplasm of heart: Secondary | ICD-10-CM | POA: Diagnosis present

## 2015-09-03 DIAGNOSIS — I82401 Acute embolism and thrombosis of unspecified deep veins of right lower extremity: Secondary | ICD-10-CM | POA: Diagnosis present

## 2015-09-03 DIAGNOSIS — E46 Unspecified protein-calorie malnutrition: Secondary | ICD-10-CM | POA: Diagnosis present

## 2015-09-03 DIAGNOSIS — R9389 Abnormal findings on diagnostic imaging of other specified body structures: Secondary | ICD-10-CM | POA: Diagnosis present

## 2015-09-03 DIAGNOSIS — Y92009 Unspecified place in unspecified non-institutional (private) residence as the place of occurrence of the external cause: Secondary | ICD-10-CM | POA: Diagnosis not present

## 2015-09-03 DIAGNOSIS — K7030 Alcoholic cirrhosis of liver without ascites: Secondary | ICD-10-CM | POA: Diagnosis present

## 2015-09-03 DIAGNOSIS — A412 Sepsis due to unspecified staphylococcus: Secondary | ICD-10-CM | POA: Diagnosis present

## 2015-09-03 DIAGNOSIS — R55 Syncope and collapse: Secondary | ICD-10-CM

## 2015-09-03 DIAGNOSIS — I248 Other forms of acute ischemic heart disease: Secondary | ICD-10-CM | POA: Diagnosis present

## 2015-09-03 DIAGNOSIS — I4581 Long QT syndrome: Secondary | ICD-10-CM | POA: Diagnosis not present

## 2015-09-03 DIAGNOSIS — R29898 Other symptoms and signs involving the musculoskeletal system: Secondary | ICD-10-CM

## 2015-09-03 DIAGNOSIS — M549 Dorsalgia, unspecified: Secondary | ICD-10-CM | POA: Diagnosis present

## 2015-09-03 DIAGNOSIS — A4101 Sepsis due to Methicillin susceptible Staphylococcus aureus: Secondary | ICD-10-CM | POA: Diagnosis present

## 2015-09-03 DIAGNOSIS — Z6829 Body mass index (BMI) 29.0-29.9, adult: Secondary | ICD-10-CM | POA: Diagnosis not present

## 2015-09-03 DIAGNOSIS — D696 Thrombocytopenia, unspecified: Secondary | ICD-10-CM | POA: Diagnosis present

## 2015-09-03 DIAGNOSIS — E261 Secondary hyperaldosteronism: Secondary | ICD-10-CM | POA: Diagnosis present

## 2015-09-03 DIAGNOSIS — E876 Hypokalemia: Secondary | ICD-10-CM | POA: Diagnosis present

## 2015-09-03 DIAGNOSIS — S51811A Laceration without foreign body of right forearm, initial encounter: Secondary | ICD-10-CM | POA: Diagnosis present

## 2015-09-03 DIAGNOSIS — E872 Acidosis: Secondary | ICD-10-CM | POA: Diagnosis present

## 2015-09-03 DIAGNOSIS — G319 Degenerative disease of nervous system, unspecified: Secondary | ICD-10-CM | POA: Diagnosis present

## 2015-09-03 DIAGNOSIS — R197 Diarrhea, unspecified: Secondary | ICD-10-CM | POA: Diagnosis present

## 2015-09-03 DIAGNOSIS — Z8719 Personal history of other diseases of the digestive system: Secondary | ICD-10-CM | POA: Diagnosis not present

## 2015-09-03 DIAGNOSIS — K259 Gastric ulcer, unspecified as acute or chronic, without hemorrhage or perforation: Secondary | ICD-10-CM | POA: Diagnosis present

## 2015-09-03 DIAGNOSIS — D72819 Decreased white blood cell count, unspecified: Secondary | ICD-10-CM | POA: Diagnosis present

## 2015-09-03 DIAGNOSIS — R931 Abnormal findings on diagnostic imaging of heart and coronary circulation: Secondary | ICD-10-CM

## 2015-09-03 DIAGNOSIS — R778 Other specified abnormalities of plasma proteins: Secondary | ICD-10-CM

## 2015-09-03 DIAGNOSIS — Z79899 Other long term (current) drug therapy: Secondary | ICD-10-CM | POA: Diagnosis not present

## 2015-09-03 LAB — RAPID URINE DRUG SCREEN, HOSP PERFORMED
AMPHETAMINES: NOT DETECTED
BARBITURATES: NOT DETECTED
Benzodiazepines: NOT DETECTED
Cocaine: NOT DETECTED
OPIATES: NOT DETECTED
TETRAHYDROCANNABINOL: NOT DETECTED

## 2015-09-03 LAB — COMPREHENSIVE METABOLIC PANEL
ALBUMIN: 3 g/dL — AB (ref 3.5–5.0)
ALK PHOS: 25 U/L — AB (ref 38–126)
ALK PHOS: 31 U/L — AB (ref 38–126)
ALT: 92 U/L — ABNORMAL HIGH (ref 17–63)
ALT: 79 U/L — ABNORMAL HIGH (ref 17–63)
ANION GAP: 11 (ref 5–15)
AST: 206 U/L — ABNORMAL HIGH (ref 15–41)
AST: 157 U/L — ABNORMAL HIGH (ref 15–41)
Albumin: 2.5 g/dL — ABNORMAL LOW (ref 3.5–5.0)
Anion gap: 13 (ref 5–15)
BILIRUBIN TOTAL: 4.8 mg/dL — AB (ref 0.3–1.2)
BUN: 17 mg/dL (ref 6–20)
BUN: 21 mg/dL — AB (ref 6–20)
CALCIUM: 8.2 mg/dL — AB (ref 8.9–10.3)
CALCIUM: 8.7 mg/dL — AB (ref 8.9–10.3)
CO2: 19 mmol/L — ABNORMAL LOW (ref 22–32)
CO2: 21 mmol/L — AB (ref 22–32)
Chloride: 93 mmol/L — ABNORMAL LOW (ref 101–111)
Chloride: 97 mmol/L — ABNORMAL LOW (ref 101–111)
Creatinine, Ser: 0.63 mg/dL (ref 0.61–1.24)
Creatinine, Ser: 0.55 mg/dL — ABNORMAL LOW (ref 0.61–1.24)
GFR calc non Af Amer: >60 mL/min (ref >60)
GLUCOSE: 174 mg/dL — AB (ref 65–99)
Glucose, Bld: 159 mg/dL — ABNORMAL HIGH (ref 65–99)
Potassium: 3.9 mmol/L (ref 3.5–5.1)
Potassium: 3.3 mmol/L — ABNORMAL LOW (ref 3.5–5.1)
Sodium: 127 mmol/L — ABNORMAL LOW (ref 135–145)
Sodium: 127 mmol/L — ABNORMAL LOW (ref 135–145)
TOTAL PROTEIN: 6.4 g/dL — AB (ref 6.5–8.1)
Total Bilirubin: 5.3 mg/dL — ABNORMAL HIGH (ref 0.3–1.2)
Total Protein: 7.2 g/dL (ref 6.5–8.1)

## 2015-09-03 LAB — CBC WITH DIFFERENTIAL/PLATELET
BASOS PCT: 0 % (ref 0–1)
Basophils Absolute: 0 10*3/uL (ref 0.0–0.1)
Eosinophils Absolute: 0 10*3/uL (ref 0.0–0.7)
Eosinophils Relative: 0 % (ref 0–5)
HCT: 41.8 % (ref 39.0–52.0)
Hemoglobin: 14.8 g/dL (ref 13.0–17.0)
LYMPHS PCT: 9 % — AB (ref 12–46)
Lymphs Abs: 0.5 10*3/uL — ABNORMAL LOW (ref 0.7–4.0)
MCH: 30.1 pg (ref 26.0–34.0)
MCHC: 35.4 g/dL (ref 30.0–36.0)
MCV: 85 fL (ref 78.0–100.0)
Monocytes Absolute: 0.5 10*3/uL (ref 0.1–1.0)
Monocytes Relative: 9 % (ref 3–12)
Neutro Abs: 4.3 10*3/uL (ref 1.7–7.7)
Neutrophils Relative %: 82 % — ABNORMAL HIGH (ref 43–77)
Platelets: 46 10*3/uL — ABNORMAL LOW (ref 150–400)
RBC: 4.92 MIL/uL (ref 4.22–5.81)
RDW: 14.9 % (ref 11.5–15.5)
WBC: 5.3 10*3/uL (ref 4.0–10.5)

## 2015-09-03 LAB — CBC
HCT: 36.2 % — ABNORMAL LOW (ref 39.0–52.0)
HEMOGLOBIN: 13 g/dL (ref 13.0–17.0)
MCH: 30.4 pg (ref 26.0–34.0)
MCHC: 35.9 g/dL (ref 30.0–36.0)
MCV: 84.6 fL (ref 78.0–100.0)
Platelets: 42 10*3/uL — ABNORMAL LOW (ref 150–400)
RBC: 4.28 MIL/uL (ref 4.22–5.81)
RDW: 15.2 % (ref 11.5–15.5)
WBC: 4.4 10*3/uL (ref 4.0–10.5)

## 2015-09-03 LAB — TROPONIN I
TROPONIN I: 0.84 ng/mL — AB (ref <0.031)
TROPONIN I: 0.59 ng/mL — AB (ref <0.031)
Troponin I: 1.26 ng/mL (ref <0.031)
Troponin I: 0.49 ng/mL — ABNORMAL HIGH (ref <0.031)

## 2015-09-03 LAB — CK
CK TOTAL: 3381 U/L — AB (ref 49–397)
Total CK: 2552 U/L — ABNORMAL HIGH (ref 49–397)

## 2015-09-03 LAB — TSH: TSH: 1.338 u[IU]/mL (ref 0.350–4.500)

## 2015-09-03 LAB — URINE MICROSCOPIC-ADD ON

## 2015-09-03 LAB — ETHANOL

## 2015-09-03 LAB — LACTIC ACID, PLASMA: Lactic Acid, Venous: 1.8 mmol/L (ref 0.5–2.0)

## 2015-09-03 LAB — GLUCOSE, CAPILLARY: GLUCOSE-CAPILLARY: 146 mg/dL — AB (ref 65–99)

## 2015-09-03 LAB — LIPID PANEL
CHOLESTEROL: 81 mg/dL (ref 0–200)
TRIGLYCERIDES: 144 mg/dL (ref <150)
VLDL: 29 mg/dL (ref 0–40)

## 2015-09-03 LAB — URINALYSIS, ROUTINE W REFLEX MICROSCOPIC
Glucose, UA: NEGATIVE mg/dL
Ketones, ur: 40 mg/dL — AB
Nitrite: POSITIVE — AB
PH: 6.5 (ref 5.0–8.0)
Protein, ur: 30 mg/dL — AB
SPECIFIC GRAVITY, URINE: 1.027 (ref 1.005–1.030)
Urobilinogen, UA: 1 mg/dL (ref 0.0–1.0)

## 2015-09-03 LAB — PROTIME-INR
INR: 1.27 (ref 0.00–1.49)
PROTHROMBIN TIME: 16 s — AB (ref 11.6–15.2)

## 2015-09-03 LAB — SAVE SMEAR

## 2015-09-03 LAB — APTT: aPTT: 30 seconds (ref 24–37)

## 2015-09-03 LAB — PROCALCITONIN: PROCALCITONIN: 4.86 ng/mL

## 2015-09-03 LAB — I-STAT CG4 LACTIC ACID, ED: Lactic Acid, Venous: 3.56 mmol/L (ref 0.5–2.0)

## 2015-09-03 LAB — AMMONIA: Ammonia: 26 umol/L (ref 9–35)

## 2015-09-03 LAB — TYPE AND SCREEN
ABO/RH(D): O NEG
Antibody Screen: NEGATIVE

## 2015-09-03 LAB — HIV ANTIBODY (ROUTINE TESTING W REFLEX): HIV Screen 4th Generation wRfx: NONREACTIVE

## 2015-09-03 LAB — MRSA PCR SCREENING: MRSA by PCR: NEGATIVE

## 2015-09-03 MED ORDER — POTASSIUM CHLORIDE CRYS ER 20 MEQ PO TBCR
40.0000 meq | EXTENDED_RELEASE_TABLET | Freq: Once | ORAL | Status: AC
  Administered 2015-09-03: 40 meq via ORAL
  Filled 2015-09-03: qty 2

## 2015-09-03 MED ORDER — HYDROXYZINE HCL 50 MG/ML IM SOLN
25.0000 mg | Freq: Four times a day (QID) | INTRAMUSCULAR | Status: DC | PRN
  Filled 2015-09-03: qty 0.5

## 2015-09-03 MED ORDER — LORAZEPAM 1 MG PO TABS: 1.0000 mg | ORAL_TABLET | Freq: Four times a day (QID) | ORAL | Status: AC | PRN

## 2015-09-03 MED ORDER — SODIUM CHLORIDE 0.9 % IV BOLUS (SEPSIS)
1000.0000 mL | Freq: Once | INTRAVENOUS | Status: AC
  Administered 2015-09-03: 1000 mL via INTRAVENOUS

## 2015-09-03 MED ORDER — SODIUM CHLORIDE 0.9 % IJ SOLN
3.0000 mL | Freq: Two times a day (BID) | INTRAMUSCULAR | Status: DC
  Administered 2015-09-03 – 2015-09-09 (×2): 3 mL via INTRAVENOUS

## 2015-09-03 MED ORDER — IOHEXOL 300 MG/ML  SOLN
75.0000 mL | Freq: Once | INTRAMUSCULAR | Status: AC | PRN
  Administered 2015-09-03: 75 mL via INTRAVENOUS

## 2015-09-03 MED ORDER — VANCOMYCIN HCL 10 G IV SOLR
1250.0000 mg | Freq: Two times a day (BID) | INTRAVENOUS | Status: DC
  Administered 2015-09-03 – 2015-09-05 (×6): 1250 mg via INTRAVENOUS
  Filled 2015-09-03 (×7): qty 1250

## 2015-09-03 MED ORDER — PIPERACILLIN-TAZOBACTAM 3.375 G IVPB
3.3750 g | Freq: Once | INTRAVENOUS | Status: AC
  Administered 2015-09-03: 3.375 g via INTRAVENOUS
  Filled 2015-09-03: qty 50

## 2015-09-03 MED ORDER — MORPHINE SULFATE (PF) 2 MG/ML IV SOLN: 2.0000 mg | INTRAVENOUS | Status: DC | PRN

## 2015-09-03 MED ORDER — FOLIC ACID 1 MG PO TABS
1.0000 mg | ORAL_TABLET | Freq: Every day | ORAL | Status: DC
  Administered 2015-09-03 – 2015-09-10 (×7): 1 mg via ORAL
  Filled 2015-09-03 (×8): qty 1

## 2015-09-03 MED ORDER — FAMOTIDINE IN NACL 20-0.9 MG/50ML-% IV SOLN
20.0000 mg | Freq: Two times a day (BID) | INTRAVENOUS | Status: DC
  Administered 2015-09-03 – 2015-09-10 (×14): 20 mg via INTRAVENOUS
  Filled 2015-09-03 (×16): qty 50

## 2015-09-03 MED ORDER — LORAZEPAM 2 MG/ML IJ SOLN: 1.0000 mg | Freq: Four times a day (QID) | INTRAMUSCULAR | Status: AC | PRN

## 2015-09-03 MED ORDER — THIAMINE HCL 100 MG/ML IJ SOLN
100.0000 mg | Freq: Every day | INTRAMUSCULAR | Status: DC
  Administered 2015-09-05 – 2015-09-06 (×2): 100 mg via INTRAVENOUS
  Filled 2015-09-03 (×2): qty 2

## 2015-09-03 MED ORDER — ADULT MULTIVITAMIN W/MINERALS CH
1.0000 | ORAL_TABLET | Freq: Every day | ORAL | Status: DC
  Administered 2015-09-03 – 2015-09-10 (×7): 1 via ORAL
  Filled 2015-09-03 (×8): qty 1

## 2015-09-03 MED ORDER — NITROGLYCERIN 0.4 MG SL SUBL: 0.4000 mg | SUBLINGUAL_TABLET | SUBLINGUAL | Status: DC | PRN

## 2015-09-03 MED ORDER — ATORVASTATIN CALCIUM 40 MG PO TABS
40.0000 mg | ORAL_TABLET | Freq: Every day | ORAL | Status: DC
  Administered 2015-09-03 – 2015-09-09 (×6): 40 mg via ORAL
  Filled 2015-09-03 (×7): qty 1

## 2015-09-03 MED ORDER — LORAZEPAM 2 MG/ML IJ SOLN
0.0000 mg | Freq: Two times a day (BID) | INTRAMUSCULAR | Status: DC
Start: 2015-09-05 — End: 2015-09-03

## 2015-09-03 MED ORDER — LORAZEPAM 2 MG/ML IJ SOLN: 0.0000 mg | Freq: Four times a day (QID) | INTRAMUSCULAR | Status: AC

## 2015-09-03 MED ORDER — LORAZEPAM 2 MG/ML IJ SOLN
0.0000 mg | Freq: Four times a day (QID) | INTRAMUSCULAR | Status: DC
  Administered 2015-09-03: 1 mg via INTRAVENOUS
  Filled 2015-09-03 (×2): qty 1

## 2015-09-03 MED ORDER — VITAMIN B-1 100 MG PO TABS
100.0000 mg | ORAL_TABLET | Freq: Every day | ORAL | Status: DC
  Administered 2015-09-03 – 2015-09-10 (×5): 100 mg via ORAL
  Filled 2015-09-03 (×6): qty 1

## 2015-09-03 MED ORDER — PERFLUTREN LIPID MICROSPHERE
INTRAVENOUS | Status: AC
  Filled 2015-09-03: qty 10

## 2015-09-03 MED ORDER — LORAZEPAM 2 MG/ML IJ SOLN: 0.0000 mg | Freq: Two times a day (BID) | INTRAMUSCULAR | Status: AC

## 2015-09-03 MED ORDER — PIPERACILLIN-TAZOBACTAM 3.375 G IVPB
3.3750 g | Freq: Three times a day (TID) | INTRAVENOUS | Status: DC
  Administered 2015-09-03 – 2015-09-04 (×3): 3.375 g via INTRAVENOUS
  Filled 2015-09-03 (×3): qty 50

## 2015-09-03 MED ORDER — OXYCODONE-ACETAMINOPHEN 5-325 MG PO TABS: 1.0000 | ORAL_TABLET | ORAL | Status: DC | PRN

## 2015-09-03 MED ORDER — GABAPENTIN 300 MG PO CAPS
600.0000 mg | ORAL_CAPSULE | Freq: Three times a day (TID) | ORAL | Status: DC
  Administered 2015-09-03 – 2015-09-10 (×18): 600 mg via ORAL
  Filled 2015-09-03 (×28): qty 2

## 2015-09-03 MED ORDER — PERFLUTREN LIPID MICROSPHERE
1.0000 mL | INTRAVENOUS | Status: AC | PRN
  Administered 2015-09-03: 2 mL via INTRAVENOUS

## 2015-09-03 MED ORDER — SODIUM CHLORIDE 0.9 % IV SOLN
INTRAVENOUS | Status: DC
  Administered 2015-09-03 – 2015-09-08 (×5): via INTRAVENOUS

## 2015-09-03 NOTE — Progress Notes (Signed)
  Echocardiogram 2D Echocardiogram has been performed.  Aggie Cosier 09/03/2015, 1:43 PM

## 2015-09-03 NOTE — ED Notes (Signed)
Patient transported to X-ray 

## 2015-09-03 NOTE — Progress Notes (Signed)
ANTIBIOTIC CONSULT NOTE - INITIAL  Pharmacy Consult for Vancomycin/Zosyn Indication: Sepsis  Allergies  Allergen Reactions  . Aspirin Other (See Comments)    Ulcers.    Patient Measurements: Height: 5\' 9"  (175.3 cm) Weight: 200 lb (90.719 kg) IBW/kg (Calculated) : 70.7   Vital Signs: Temp: 101.4 F (38.6 C) (09/07 2333) Temp Source: Rectal (09/07 2333) BP: 120/65 mmHg (09/07 2300) Pulse Rate: 110 (09/07 2300) Intake/Output from previous day:   Intake/Output from this shift:    Labs: No results for input(s): WBC, HGB, PLT, LABCREA, CREATININE in the last 72 hours. CrCl cannot be calculated (Patient has no serum creatinine result on file.). No results for input(s): VANCOTROUGH, VANCOPEAK, VANCORANDOM, GENTTROUGH, GENTPEAK, GENTRANDOM, TOBRATROUGH, TOBRAPEAK, TOBRARND, AMIKACINPEAK, AMIKACINTROU, AMIKACIN in the last 72 hours.   Microbiology: No results found for this or any previous visit (from the past 720 hour(s)).  Medical History: Past Medical History  Diagnosis Date  . Lumbar disc disorder   . Anemia   . Neuropathy of lower extremity 2007    very poor balance  . Glaucoma (increased eye pressure)     bilateral  . Gastric ulcer   . Chronic back pain   . Alcohol abuse   . 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 Gastritis   . GI bleeding   . Seizures     unknown type  . Arthritis     back & neck    Medications:   (Not in a hospital admission) Scheduled:   Infusions:  . piperacillin-tazobactam (ZOSYN)  IV    . sodium chloride     Assessment: 70 yoM admitted s/p fall.  Vancomycin/Zosyn per Rx for sepsis.   Goal of Therapy:  Vancomycin trough level 15-20 mcg/ml  Plan:   Zosyn 3.375 gm IV q8h EI  Vancomcyin 1250mg  IV q12h  F/u SCr/cultures/levels  Joshua Waller R 09/03/2015,12:48 AM

## 2015-09-03 NOTE — Progress Notes (Signed)
Subjective: Patient admitted this morning, see detailed H&P by Dr. Blaine Hamper. 64 y.o. male with a history of alcohol abuse, Mallory-Weiss tear, seizures, thrombocytopenia, gastric ulcer with GI bleeding who presents with altered bowel complaints. He was slightly confused on arrival and was a difficult historian. He complains of diarrhea for 5 days. He passed out at home on Sunday. Her EMS report the patient was not ambulating at home well and having multiple falls. In the ER he was found to be febrile with a temp of 101.4, tachycardic and had a lactic acid of 3.6. UA was concerning for UTI. Sodium was 127 and LFTs were elevated. He e was started on vancomycin and Zosyn. Due to widened mediastinum on chest x-ray, a CT of the chest was performed and revealed right anterior fourth and fifth rib fractures. He also has rhabdomyolysis with a CK of 3381. Troponin was 1.26. EKG shows sinus tachycardia and increased QTC. Patient continues to be confused. Denies any pain. Filed Vitals:   09/03/15 1345  BP: 107/78  Pulse: 99  Temp: 97.9 F (36.6 C)  Resp: 25    Chest: Clear Bilaterally Heart : S1S2 RRR Abdomen: Soft, nontender Ext : No edema Neuro: Confused, not oriented 2  A/P Sepsis due to UTI Alcohol abuse Bacteremia History of gastric ulcer and GI bleed Elevated troponin Hyponatremia Rhabdomyolysis Acute encephalopathy   Today total of 2 bottles of blood cultures positive for gram-positive cocci in clusters. Continue vancomycin. We'll follow the final culture report. Patient had mild elevation of troponin, likely from rhabdomyolysis, cardiology  following Continue CI WA protocol for alcohol abuse Patient's encephalopathy likely multifactorial from liver disease, alcohol abuse, bacteremia, UTI. Ammonia level normal [place] Hospitalist [phone]

## 2015-09-03 NOTE — H&P (Addendum)
[facility] History and Physical  Joshua Waller EXB:[id number] DOB: June 07, 1951 DOA: 09/02/2015  Referring physician: ED physician PCP: Leonard Downing, MD  Specialists:   Chief Complaint: Syncope, increased urinary frequency, diarrhea, right leg weakness, pain over both thighs, AMS.   HPI: Joshua Waller is a 64 y.o. male with PMH of alcohol abuse, Mallory-Weiss tear, seizure, thrombocytopenia, gastric ulcer, GI bleeding, who presents with syncope, increased urinary frequency, diarrhea, right leg weakness, pain over both thighs, AMS.   Patient is a poor historian. He reports that he has increased urinary frequency recently, but no burning or dysuria. No flank pain. He has fever and chills. He also states that he had diarrhea for 4 days, which resolved yesterday. No diarrhea today. He does not have nausea, vomiting or abdominal pain. He reports that he passed out and fell on the floor at home in Sunday which lasted approximately 5 minutes. He did not have chest pain, shortness of breath, dizziness, vision change or hearing loss. He states that he has chronic weakness in both legs, but feels that his right leg is weaker recently.  In ED, patient was found to have troponin 1.26, lactate is 3.56, CK 3381, positive urinalysis with small amount of leukocyte and positive nitrate, WBC 5.3, platelet 46, hemoglobin 14.8, temperature 101.4, tachycardia, sodium 127, renal function okay. Abnormal liver function with ALP 31, AST 206, ALT 92, total bilirubin 5.3. CXR showed enlarged cardiomediastinal silhouette. Underlying traumatic mediastinal injury is not excluded, and no focal consolidation. CT head is negative for acute abnormalities. Patient is admitted to inpatient for further eval and treatment.  Where does patient live?   At home    Can patient participate in ADLs?   Some   Review of Systems:   General: has fevers, chills, has poor appetite, has fatigue HEENT: no blurry vision, hearing changes  or sore throat Pulm: no dyspnea, coughing, wheezing CV: no chest pain, palpitations Abd: no nausea, vomiting, abdominal pain, had diarrhea, no constipation GU: no dysuria, burning on urination, has increased urinary frequency, no hematuria  Ext: no leg edema Neuro: has right leg weakness, no numbness, or tingling, no vision change or hearing loss Skin: no rash MSK: No muscle spasm, no deformity, no limitation of range of movement in spin. Has pain over both thighs. Heme: No easy bruising.  Travel history: No recent long distant travel.  Allergy:  Allergies  Allergen Reactions  . Aspirin Other (See Comments)    Ulcers.    Past Medical History  Diagnosis Date  . Lumbar disc disorder   . Anemia   . Neuropathy of lower extremity 2007    very poor balance  . Glaucoma (increased eye pressure)     bilateral  . Gastric ulcer   . Chronic back pain   . Alcohol abuse   . 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 Gastritis   . GI bleeding   . Seizures     unknown type  . Arthritis     back & neck    Past Surgical History  Procedure Laterality Date  . Esophagogastroduodenoscopy  12/29/2011    Procedure: ESOPHAGOGASTRODUODENOSCOPY (EGD);  Surgeon: Zenovia Jarred, MD;  Location: [facility] ENDOSCOPY;  Service: Gastroenterology;  Laterality: N/A;  . Tonsillectomy    . Esophagogastroduodenoscopy  12/22/2012    Procedure: ESOPHAGOGASTRODUODENOSCOPY (EGD);  Surgeon: Lear Ng, MD;  Location: [facility] ENDOSCOPY;  Service: Endoscopy;  Laterality: N/A;  . Colonoscopy  12/23/2012    Procedure: COLONOSCOPY;  Surgeon: Lear Ng, MD;  Location:  [place] ENDOSCOPY;  Service: Endoscopy;  Laterality: N/A;  . Esophagogastroduodenoscopy N/A 09/06/2013    Procedure: ESOPHAGOGASTRODUODENOSCOPY (EGD);  Surgeon: Beryle Beams, MD;  Location: [facility] ENDOSCOPY;  Service: Endoscopy;  Laterality: N/A;  bedside  . Colonoscopy N/A 09/10/2013    Procedure: COLONOSCOPY;  Surgeon: Beryle Beams, MD;  Location: [facility] ENDOSCOPY;   Service: Endoscopy;  Laterality: N/A;  . Esophagogastroduodenoscopy N/A 11/08/2013    Procedure: ESOPHAGOGASTRODUODENOSCOPY (EGD);  Surgeon: Beryle Beams, MD;  Location: [facility] ENDOSCOPY;  Service: Endoscopy;  Laterality: N/A;    Social History:  reports that he has never smoked. He has never used smokeless tobacco. He reports that he drinks about 3.6 oz of alcohol per week. He reports that he does not use illicit drugs.  Family History:  Family History  Problem Relation Age of Onset  . Heart disease Mother   . Lumbar disc disease Father      Prior to Admission medications   Medication Sig Start Date End Date Taking? Authorizing Provider  gabapentin (NEURONTIN) 600 MG tablet Take 600 mg by mouth 3 (three) times daily.   Yes Historical Provider, MD  traMADol (ULTRAM) 50 MG tablet Take 100 mg by mouth 2 (two) times daily.    Yes Historical Provider, MD  oxyCODONE-acetaminophen (PERCOCET/ROXICET) 5-325 MG per tablet Take 1 tablet by mouth every 4 (four) hours as needed for severe pain. Patient not taking: Reported on 09/02/2015 02/27/14   Jola Schmidt, MD    Physical Exam: Filed Vitals:   09/02/15 2333 09/03/15 0059 09/03/15 0100 09/03/15 0313  BP:  103/63 119/67 109/58  Pulse:  105 102   Temp: 101.4 F (38.6 C) 100.8 F (38.2 C)    TempSrc: Rectal Rectal    Resp:  18 22 23   Height:      Weight:      SpO2:  95% 95% 95%   General: Not in acute distress HEENT:       Eyes: PERRL, EOMI, no scleral icterus.       ENT: No discharge from the ears and nose, no pharynx injection, no tonsillar enlargement.        Neck: No JVD, no bruit, no mass felt. Heme: No neck lymph node enlargement. Cardiac: S1/S2, RRR, No murmurs, No gallops or rubs. Pulm:  No rales, wheezing, rhonchi or rubs. Abd: Soft, nondistended, nontender, no rebound pain, no organomegaly, BS present. No CVA tenderness. Ext: No pitting leg edema bilaterally. 2+DP/PT pulse bilaterally. Musculoskeletal: No joint deformities, No  joint redness or warmth, no limitation of ROM in spin. Has tenderness over both medial thighs.  Skin: No rashes. Has a small skin tear in right frontal area. Neuro: Alert, mildly confused, but oriented X3, cranial nerves II-XII grossly intact, muscle strength 4/5 in right leg, and 5/5 in other extremities, sensation to light touch intact. Brachial reflex 2+ bilaterally. Knee reflex 1+ bilaterally. Negative Babinski's sign. Normal finger to nose test. Psych: Patient is not psychotic, no suicidal or hemocidal ideation.  Labs on Admission:  Basic Metabolic Panel:  Recent Labs Lab 09/03/15 0011  NA 127*  K 3.9  CL 93*  CO2 21*  GLUCOSE 174*  BUN 21*  CREATININE 0.63  CALCIUM 8.7*   Liver Function Tests:  Recent Labs Lab 09/03/15 0011  AST 206*  ALT 92*  ALKPHOS 31*  BILITOT 5.3*  PROT 7.2  ALBUMIN 3.0*   No results for input(s): LIPASE, AMYLASE in the last 168 hours. No results for input(s): AMMONIA in the last 168  hours. CBC:  Recent Labs Lab 09/03/15 0011  WBC 5.3  NEUTROABS 4.3  HGB 14.8  HCT 41.8  MCV 85.0  PLT 46*   Cardiac Enzymes:  Recent Labs Lab 09/03/15 0011  CKTOTAL 3381*  TROPONINI 1.26*    BNP (last 3 results) No results for input(s): BNP in the last 8760 hours.  ProBNP (last 3 results) No results for input(s): PROBNP in the last 8760 hours.  CBG: No results for input(s): GLUCAP in the last 168 hours.  Radiological Exams on Admission: Dg Chest 2 View  09/03/2015   CLINICAL DATA:  64 year old male with fall and fever.  EXAM: CHEST  2 VIEW  COMPARISON:  Chest radiograph dated 09/25/2013 all  FINDINGS: There is widening of the cardiomediastinal silhouette, significantly increased from prior study. This may be partially related to shallow inspiratory effort and imaging technique. Underlying Traumatic aortic or mediastinal injury is not excluded. Clinical correlation recommended. CT with contrast may provide better evaluation there is clinical  concern for traumatic aortic or mediastinal injury. There are bibasilar linear atelectatic changes. There is no focal consolidation, pleural effusion, or pneumothorax. There are degenerative changes of the shoulders. No acute fracture.  IMPRESSION: Enlarged cardiomediastinal silhouette. Underlying traumatic mediastinal injury is not excluded. Clinical correlation is recommended. CT of the chest may provide better evaluation if clinically indicated.  No focal consolidation.  These results were called by telephone at the time of interpretation on 09/03/2015 at 1:09 am to Dr. Gareth Morgan , who verbally acknowledged these results.   Electronically Signed   By: Anner Crete M.D.   On: 09/03/2015 01:09   Ct Head Wo Contrast  09/03/2015   CLINICAL DATA:  64 year old male with fall.  EXAM: CT HEAD WITHOUT CONTRAST  TECHNIQUE: Contiguous axial images were obtained from the base of the skull through the vertex without intravenous contrast.  COMPARISON:  CT dated 03/01/2015  FINDINGS: There is stable dilatation of the ventricles out of proportion with the sulci which may represent central volume loss versus normal pressure hydrocephalus. Clinical correlation is recommended. Periventricular and deep white matter hypodensities represent chronic microvascular ischemic changes. Stable areas of bifrontal old infarct and encephalomalacia changes noted. There is no intracranial hemorrhage. No mass effect or midline shift identified.  The visualized paranasal sinuses and mastoid air cells are well aerated. The calvarium is intact.  IMPRESSION: No acute intracranial pathology.  Age-related atrophy and chronic microvascular ischemic disease. 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   Electronically Signed   By: Anner Crete M.D.   On: 09/03/2015 02:17   Ct Chest W Contrast  09/03/2015   CLINICAL DATA:  64 year old male with recent fall and widened mediastinum on chest x-ray.  EXAM: CT CHEST WITH CONTRAST  TECHNIQUE: Multidetector CT  imaging of the chest was performed during intravenous contrast administration.  CONTRAST:  92mL OMNIPAQUE IOHEXOL 300 MG/ML  SOLN  COMPARISON:  Chest radiograph dated 09/03/2015  FINDINGS: There is subsegmental right lung base consolidative changes, likely atelectasis. Pneumonia is less likely. Trace right pleural effusion may be present. There is no pneumothorax. The central airways are patent.  The thoracic aorta is unremarkable. There is mild prominence of the main pulmonary trunk indicative of a degree of pulmonary hypertension. There is mild cardiomegaly. No significant pericardial effusion. Along the anterior and posterior. Ascending aorta represent pericardial recess. There is coronary vascular calcification. No hilar or mediastinal adenopathy. The visualized thyroid gland appears unremarkable. The esophagus is collapsed.  There is no axillary adenopathy. The chest wall  soft tissues appear unremarkable. There is osteopenia with degenerative changes of the left humeral head. Right anterior fourth and fifth rib fractures noted.  A cirrhosis with evidence of portal hypertension, splenomegaly and upper abdominal varices. High attenuating debris within the gallbladder may represent sludge or small stones or vicarious excretion of contrast.  IMPRESSION: Right anterior fourth and fifth rib fractures.  No pneumothorax.  No acute/traumatic intrathoracic or mediastinal injury.   Electronically Signed   By: Anner Crete M.D.   On: 09/03/2015 03:05    EKG: Independently reviewed.  Abnormal findings: QTC 534,  ischemic change. Assessment/Plan Principal Problem:   UTI (lower urinary tract infection) Active Problems:   ETOH abuse   Neuropathy   Gastric ulcer   Weakness of right leg   Hyponatremia   Thrombocytopenia   Sepsis   Abnormal LFTs   Mediastinal widening   Acute encephalopathy   Syncope   Rhabdomyolysis   Elevated troponin    UTI and sepsis 2/2 UTI: Patient has increased urinary frequency  and positive urinalysis, consistent with UTI. Pt is septic with elevated lactate, fever and tachycardia. He is hemodynamically stable on admission.   - Admit to SDU - ED started vancomycin and Zosyn, will continue both. - Follow up results of urine and blood cx and amend antibiotic regimen if needed per sensitivity results - prn hydroxyzine for nausea -will get Procalcitonin and trend lactic acid levels per sepsis protocol.  - IVF: 3L of NS bolus in ED, followed by 75 cc/h   Alcohol abuse: -Did counseling about the importance of quitting drinking -CIWA protocol -check alcohol level  Hx of Gastric ulcer and GIB: -IV pepcid  Hyponatremia: Na 127. Likely due to decreased oral intake. -IVF: NS as above  Thrombocytopenia: This is a chronic issue, likely due to alcohol abuse. Platelet is 46 on admission, no bleeding tendency. -Follow-up by CBC  Abnormal LFTs:  ALP 31, AST 206, ALT 92, total bilirubin 5.3. It is at least partially related to alcohol abuse, but need to rule out other possibilities, such as hepatitis and biliary system obstruction. -check hepatitis panel -Abdominal ultrasound  Mediastinal widening: As suggested by chest x-ray. Unclear etiology, traumatic injury cannot be ruled out. -CT-chest with contrast  Addendum: CT-chest with contrast showed right anterior fourth and fifth rib fractures.No pneumothorax. No acute/traumatic intrathoracic or mediastinal injury. -Pt is on prn percocet for pain.  Acute encephalopathy: Patient is mildly confused, but still oriented 3. Most likely due to UTI. Other differential diagnoses include stroke given right leg weakness, hepatic encephalopathy, and TTP given thrombocytopenia. -Frequent neuro checks -Peripheral smear to rule out schistocytes for TTP -Ammonia level  Rhabdomyolysis: CK 3381, renal function okay, has abnormal liver function. -IV fluid as above -Repeat CK morning  Syncope: Etiology is not clear. Differential  diagnosis includes stroke given right leg weakness, and ACS given elevated troponin. -MRI-brain to r/o stroke.  Elevated troponin: Troponin 1.26, EKG has no ischemic change. Likely due to demanding ischemic secondary to sepsis. Traumatic injury to mediastinum is also possible differential diagnosis as suggested by chest x-ray. Currently patient does not have any chest pain, shortness of breath or palpitation. - no ASA due to allergy - cycle CE q6 x3 and repeat EKG in the am  - Nitroglycerin, Morphine and lipitor  - Risk factor stratification: will check FLP, TSH, A1C, UDS - 2d echo - Will hold IV heparin now since the most likely etiology is demanding ischemia given no any chest pain and ischemia on EKG,  also due to thrombocytopenia and rib fracture.  - Called Card PA in AM, will see today.  Bilateral thigh pain: Likely due to muscle injury given elevated CK level -X-ray of femurs to r/o bony fracture. -pain control: When necessary Percocet  Diarrhea: Seems to be resolved. -will check c diff pcr if patient has recurrent diarrhea. -IVF as above   DVT ppx: SCD  Code Status: Full code Family Communication: None at bed side.   Disposition Plan: Admit to inpatient   Date of Service 09/03/2015    Ivor Costa [facility] Pager [phone]  If 7PM-7AM, please contact night-coverage [email or web address] Password [email or web address] 09/03/2015, 3:15 AM

## 2015-09-03 NOTE — ED Notes (Signed)
Patient transported to CT 

## 2015-09-03 NOTE — Consult Note (Signed)
CARDIOLOGY CONSULT NOTE   Patient ID: Joshua Waller MRN: [id number] DOB/AGE: 1951-11-11 64 y.o.  Admit date: 09/02/2015  Primary Physician   Leonard Downing, MD Primary Cardiologist   New Reason for Consultation   Syncope, elevated troponin, prolonged QTc  HPI: Joshua Waller is a 64 y.o. male with a history of alcohol abuse, Mallory-Weiss tear, seizure, thrombocytopenia, gastric ulcer, GI bleeding, who presented to [facility] w/ syncope, urinary frequency and AMS and found to have urosepsis.  In the ER he was found to be febrile with a temp of 101.4, tachycardic and had a lactic acid of 3.6. UA was concerning for UTI. Sodium was 127 and LFTs were elevated. He e was started on vancomycin and Zosyn. Due to widened mediastinum on chest x-ray, a CT of the chest was performed and revealed right anterior fourth and fifth rib fractures. He also has rhabdomyolysis with a CK of 3381. Troponin was 1.26. EKG shows sinus tachycardia and prolonged QTC.  Patient is a poor historian and rambles on. He denies chest pain and I cannot get a clear history on how he feel and how frequently he falls. Apparently, it has been more frequent recently. He did drink last night but has run into some money problems and wasn't able to drink for a period of time. He also reports urinary frequency and diarrhea over the past week. He reports that he passed out and fell on the floor at home in Sunday which lasted approximately 5 minutes. He did not have chest pain, shortness of breath, dizziness, vision change or hearing loss. He states that he has chronic weakness in both legs, but feels that his right leg is weaker recently.    Past Medical History  Diagnosis Date  . Lumbar disc disorder   . Anemia   . Neuropathy of lower extremity 2007    very poor balance  . Glaucoma (increased eye pressure)     bilateral  . Gastric ulcer   . Chronic back pain   . Alcohol abuse   . 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 Gastritis   . GI  bleeding   . Seizures     unknown type  . Arthritis     back & neck     Past Surgical History  Procedure Laterality Date  . Esophagogastroduodenoscopy  12/29/2011    Procedure: ESOPHAGOGASTRODUODENOSCOPY (EGD);  Surgeon: Zenovia Jarred, MD;  Location: [facility] ENDOSCOPY;  Service: Gastroenterology;  Laterality: N/A;  . Tonsillectomy    . Esophagogastroduodenoscopy  12/22/2012    Procedure: ESOPHAGOGASTRODUODENOSCOPY (EGD);  Surgeon: Lear Ng, MD;  Location: [facility] ENDOSCOPY;  Service: Endoscopy;  Laterality: N/A;  . Colonoscopy  12/23/2012    Procedure: COLONOSCOPY;  Surgeon: Lear Ng, MD;  Location: [facility] ENDOSCOPY;  Service: Endoscopy;  Laterality: N/A;  . Esophagogastroduodenoscopy N/A 09/06/2013    Procedure: ESOPHAGOGASTRODUODENOSCOPY (EGD);  Surgeon: Beryle Beams, MD;  Location: [facility] ENDOSCOPY;  Service: Endoscopy;  Laterality: N/A;  bedside  . Colonoscopy N/A 09/10/2013    Procedure: COLONOSCOPY;  Surgeon: Beryle Beams, MD;  Location: [facility] ENDOSCOPY;  Service: Endoscopy;  Laterality: N/A;  . Esophagogastroduodenoscopy N/A 11/08/2013    Procedure: ESOPHAGOGASTRODUODENOSCOPY (EGD);  Surgeon: Beryle Beams, MD;  Location: [facility] ENDOSCOPY;  Service: Endoscopy;  Laterality: N/A;    Allergies  Allergen Reactions  . Aspirin Other (See Comments)    Ulcers.    I have reviewed the patient's current medications . atorvastatin  40 mg Oral q1800  . famotidine (PEPCID)  IV  20 mg Intravenous Q12H  . folic acid  1 mg Oral Daily  . gabapentin  600 mg Oral TID  . LORazepam  0-4 mg Intravenous Q6H   Followed by  . [START ON 09/05/2015] LORazepam  0-4 mg Intravenous Q12H  . multivitamin with minerals  1 tablet Oral Daily  . piperacillin-tazobactam (ZOSYN)  IV  3.375 g Intravenous Q8H  . sodium chloride  3 mL Intravenous Q12H  . thiamine  100 mg Oral Daily   Or  . thiamine  100 mg Intravenous Daily  . vancomycin  1,250 mg Intravenous Q12H   . sodium chloride 75 mL/hr at 09/03/15 0400    hydrOXYzine, LORazepam **OR** LORazepam, morphine injection, nitroGLYCERIN, oxyCODONE-acetaminophen  Prior to Admission medications   Medication Sig Start Date End Date Taking? Authorizing Provider  gabapentin (NEURONTIN) 600 MG tablet Take 600 mg by mouth 3 (three) times daily.   Yes Historical Provider, MD  traMADol (ULTRAM) 50 MG tablet Take 100 mg by mouth 2 (two) times daily.    Yes Historical Provider, MD  oxyCODONE-acetaminophen (PERCOCET/ROXICET) 5-325 MG per tablet Take 1 tablet by mouth every 4 (four) hours as needed for severe pain. Patient not taking: Reported on 09/02/2015 02/27/14   Jola Schmidt, MD     Social History   Social History  . Marital Status: Divorced    Spouse Name: N/A  . Number of Children: N/A  . Years of Education: N/A   Occupational History  . Not on file.   Social History Main Topics  . Smoking status: Never Smoker   . Smokeless tobacco: Never Used  . Alcohol Use: 3.6 oz/week    6 Cans of beer per week     Comment: heavy drinker  . Drug Use: No  . Sexual Activity: Not on file   Other Topics Concern  . Not on file   Social History Narrative    No family status information on file.   Family History  Problem Relation Age of Onset  . Heart disease Mother   . Lumbar disc disease Father      ROS:  Full 14 point review of systems complete and found to be negative unless listed above.  Physical Exam: Blood pressure 144/71, pulse 101, temperature 97.8 F (36.6 C), temperature source Oral, resp. rate 26, height 5\' 9"  (1.753 m), weight 213 lb 6.5 oz (96.8 kg), SpO2 97 %.  General: Well developed, well nourished, male in no acute distress. Unkept  Head: Eyes PERRLA, No xanthomas.   Normocephalic and atraumatic, oropharynx without edema or exudate. Dentition: poor Lungs: CTAB Heart: HRRR S1 S2, no rub/gallop, Heart RRR. Tachy. S1, S2  murmur. pulsesare 2+ extrem.   Neck: No carotid bruits. No lymphadenopathy. No JVD. Abdomen: Bowel sounds  present, abdomen soft and non-tender without masses or hernias noted. Msk:  No spine or cva tenderness. No weakness, no joint deformities or effusions. Extremities: No clubbing or cyanosis.No  edema.  Neuro: Alert and oriented X 3. No focal deficits noted. Psych:  Good affect, responds appropriately Skin: No rashes or lesions noted.  Labs:   Lab Results  Component Value Date   WBC 4.4 09/03/2015   HGB 13.0 09/03/2015   HCT 36.2* 09/03/2015   MCV 84.6 09/03/2015   PLT 42* 09/03/2015    Recent Labs  09/03/15 0620  INR 1.27    Recent Labs Lab 09/03/15 0620  NA 127*  K 3.3*  CL 97*  CO2 19*  BUN 17  CREATININE  0.55*  CALCIUM 8.2*  PROT 6.4*  BILITOT 4.8*  ALKPHOS 25*  ALT 79*  AST 157*  GLUCOSE 159*  ALBUMIN 2.5*   MAGNESIUM  Date Value Ref Range Status  09/08/2013 2.2 1.5 - 2.5 mg/dL Final    Recent Labs  09/03/15 0011 09/03/15 0620  CKTOTAL 3381* 2552*  TROPONINI 1.26* 0.84*   No results for input(s): TROPIPOC in the last 72 hours. PRO B NATRIURETIC PEPTIDE (BNP)  Date/Time Value Ref Range Status  12/21/2012 05:05 PM 206.0* 0 - 125 pg/mL Final   Lab Results  Component Value Date   CHOL 81 09/03/2015   HDL <10* 09/03/2015   LDLCALC NOT CALCULATED 09/03/2015   TRIG 144 09/03/2015    Echo: pending  ECG:  Sinus tachycardia HR 100 Low voltage, extremity leads Prolonged QT interval: 414/534 ms   Radiology:  Dg Chest 2 View  09/03/2015   CLINICAL DATA:  63 year old male with fall and fever.  EXAM: CHEST  2 VIEW  COMPARISON:  Chest radiograph dated 09/25/2013 all  FINDINGS: There is widening of the cardiomediastinal silhouette, significantly increased from prior study. This may be partially related to shallow inspiratory effort and imaging technique. Underlying Traumatic aortic or mediastinal injury is not excluded. Clinical correlation recommended. CT with contrast may provide better evaluation there is clinical concern for traumatic aortic or  mediastinal injury. There are bibasilar linear atelectatic changes. There is no focal consolidation, pleural effusion, or pneumothorax. There are degenerative changes of the shoulders. No acute fracture.  IMPRESSION: Enlarged cardiomediastinal silhouette. Underlying traumatic mediastinal injury is not excluded. Clinical correlation is recommended. CT of the chest may provide better evaluation if clinically indicated.  No focal consolidation.  These results were called by telephone at the time of interpretation on 09/03/2015 at 1:09 am to Dr. Gareth Morgan , who verbally acknowledged these results.   Electronically Signed   By: Anner Crete M.D.   On: 09/03/2015 01:09   Ct Head Wo Contrast  09/03/2015   CLINICAL DATA:  64 year old male with fall.  EXAM: CT HEAD WITHOUT CONTRAST  TECHNIQUE: Contiguous axial images were obtained from the base of the skull through the vertex without intravenous contrast.  COMPARISON:  CT dated 03/01/2015  FINDINGS: There is stable dilatation of the ventricles out of proportion with the sulci which may represent central volume loss versus normal pressure hydrocephalus. Clinical correlation is recommended. Periventricular and deep white matter hypodensities represent chronic microvascular ischemic changes. Stable areas of bifrontal old infarct and encephalomalacia changes noted. There is no intracranial hemorrhage. No mass effect or midline shift identified.  The visualized paranasal sinuses and mastoid air cells are well aerated. The calvarium is intact.  IMPRESSION: No acute intracranial pathology.  Age-related atrophy and chronic microvascular ischemic disease. 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   Electronically Signed   By: Anner Crete M.D.   On: 09/03/2015 02:17   Ct Chest W Contrast  09/03/2015   CLINICAL DATA:  64 year old male with recent fall and widened mediastinum on chest x-ray.  EXAM: CT CHEST WITH CONTRAST  TECHNIQUE: Multidetector CT imaging of the chest was  performed during intravenous contrast administration.  CONTRAST:  65mL OMNIPAQUE IOHEXOL 300 MG/ML  SOLN  COMPARISON:  Chest radiograph dated 09/03/2015  FINDINGS: There is subsegmental right lung base consolidative changes, likely atelectasis. Pneumonia is less likely. Trace right pleural effusion may be present. There is no pneumothorax. The central airways are patent.  The thoracic aorta is unremarkable. There is mild prominence of the main  pulmonary trunk indicative of a degree of pulmonary hypertension. There is mild cardiomegaly. No significant pericardial effusion. Along the anterior and posterior. Ascending aorta represent pericardial recess. There is coronary vascular calcification. No hilar or mediastinal adenopathy. The visualized thyroid gland appears unremarkable. The esophagus is collapsed.  There is no axillary adenopathy. The chest wall soft tissues appear unremarkable. There is osteopenia with degenerative changes of the left humeral head. Right anterior fourth and fifth rib fractures noted.  A cirrhosis with evidence of portal hypertension, splenomegaly and upper abdominal varices. High attenuating debris within the gallbladder may represent sludge or small stones or vicarious excretion of contrast.  IMPRESSION: Right anterior fourth and fifth rib fractures.  No pneumothorax.  No acute/traumatic intrathoracic or mediastinal injury.   Electronically Signed   By: Anner Crete M.D.   On: 09/03/2015 03:05   Mr Brain Wo Contrast  09/03/2015   CLINICAL DATA:  Altered mental status. Alcohol abuse. Fall at home.  EXAM: MRI HEAD WITHOUT CONTRAST  TECHNIQUE: Multiplanar, multiecho pulse sequences of the brain and surrounding structures were obtained without intravenous contrast.  COMPARISON:  Head CT from yesterday  FINDINGS: Motion degraded study, requiring fast brain protocol, but overall diagnostic.  Calvarium and upper cervical spine: No focal marrow signal abnormality. Transverse ligamentous  thickening without foramen magnum stenosis.  Orbits: No significant findings.  Sinuses and Mastoids: Clear. Mastoid and middle ears are clear.  Brain: No acute abnormality such as acute infarct, hemorrhage, hydrocephalus, or mass lesion. No evidence of large vessel occlusion.  Bifrontal and right more than left temporal pole encephalomalacia and hemosiderin staining, posttraumatic pattern. There is superimposed chronic small vessel disease with ischemic gliosis throughout the bilateral cerebral white matter. Atrophy is advanced, especially for age, and when accounting for areas of cystic encephalomalacia is generalized. Cerebellar involvement, potentially exacerbated by history of ethanol use. Prominent ventriculomegaly, including the temporal horns. Given the degree of corpus callosum thinning and cortical atrophy, this is considered secondary to volume loss rather than communicating hydrocephalus.  IMPRESSION: 1. Motion degraded study without acute finding. 2. 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   Electronically Signed   By: Monte Fantasia M.D.   On: 09/03/2015 07:36   Dg Femur Min 2 Views Left  09/03/2015   CLINICAL DATA:  64 year old male with right leg weakness  EXAM: LEFT FEMUR 2 VIEWS  COMPARISON:  Right leg radiograph dated 09/03/2015.  FINDINGS: There is no evidence of fracture or other focal bone lesions. Soft tissues are unremarkable.  IMPRESSION: Negative.   Electronically Signed   By: Anner Crete M.D.   On: 09/03/2015 03:22   Dg Femur, Min 2 Views Right  09/03/2015   CLINICAL DATA:  64 year old male with weakness of the right lower extremity.  EXAM: RIGHT FEMUR 2 VIEWS  COMPARISON:  None.  FINDINGS: No acute fracture or dislocation. There is a focal area osteopenia involving the lateral femoral condyle seen on the lateral projection. The soft tissues are unremarkable.  IMPRESSION: No acute fracture or dislocation.   Electronically  Signed   By: Anner Crete M.D.   On: 09/03/2015 03:15    ASSESSMENT AND PLAN:    Principal Problem:   UTI (lower urinary tract infection) Active Problems:   ETOH abuse   Neuropathy   Gastric ulcer   Weakness of right leg   Hyponatremia   Thrombocytopenia   Sepsis   Abnormal LFTs   Mediastinal widening   Acute encephalopathy   Syncope  Rhabdomyolysis   Elevated troponin  Joshua Waller is a 64 y.o. male with a history of alcohol abuse, Mallory-Weiss tear, seizure, thrombocytopenia, gastric ulcer, GI bleeding, who presented to [facility] w/ syncope, urinary frequency and AMS and found to have urosepsis.  In the ER he was found to be febrile with a temp of 101.4, tachycardic and had a lactic acid of 3.6. UA was concerning for UTI. Sodium was 127 and LFTs were elevated. He e was started on vancomycin and Zosyn. Due to widened mediastinum on chest x-ray, a CT of the chest was performed and revealed right anterior fourth and fifth rib fractures. He also has rhabdomyolysis with a CK of 3381. Troponin was 1.26. EKG shows sinus tachycardia and prolonged QTC.  Syncope: Etiology is not clear. Differential diagnosis includes stroke given right leg weakness, and ACS given elevated troponin. -- Head CT negative.  -- 2D  ECHO pending   Elevated troponin: Troponin 1.26--> 0.83, EKG w/ no acute ST or TW changes. Likely due to demand ischemic in the setting of urosepsis and rhabdo. Traumatic injury to mediastinum is also possible differential diagnosis as suggested by chest x-ray/CT with rib fractures. Currently patient does not have any chest pain, shortness of breath or palpitation. -- No ASA due to allergy -- Continue to cycle troponin  -- Agree w/ 2D ECHO  Urosepsis- Pt is septic with elevated lactate, fever and tachycardia. He was HD stable on admission. -- Cont Abx and IVFs per IM.   Alcohol abuse: -Did counseling about the importance of quitting drinking -CIWA protocol  Hx of Gastric ulcer  and GIB: -IV pepcid  Hyponatremia: Na 127. Likely due to decreased oral intake. -IVF: NS as above  Thrombocytopenia: This is a chronic issue, likely due to alcohol abuse. Platelet is 46 on admission, no bleeding tendency. -Follow-up by CBC  Abnormal LFTs: ALP 31, AST 206, ALT 92, total bilirubin 5.3. It is at least partially related to alcohol abuse, but need to rule out other possibilities, such as hepatitis and biliary system obstruction.  Mediastinal widening: As suggested by chest x-ray. Unclear etiology, traumatic injury cannot be ruled out. CT chest showed rib fractures   Acute encephalopathy: Patient is mildly confused, but still oriented 3. Most likely due to UTI. Other differential diagnoses include stroke given right leg weakness, hepatic encephalopathy, and TTP given thrombocytopenia.  Rhabdomyolysis: CK 3381, renal function okay, has abnormal liver function. Bilateral leg pain and orange urine -- IV fluid as above - Repeat CK morning    Signed: Eileen Stanford, PA-C 09/03/2015 12:06 PM  Pager [phone]  Co-Sign MD As above, patient seen and examined. Briefly he is a 64 year old male with a past medical history of alcohol abuse, thrombocytopenia, previous GI bleed for evaluation of elevated troponin and question syncope. The history is very difficult and his landlord relayed some of recent events. Patient apparently drinking very heavily up until September 5. His landlord found him on a couch with urine and feces. They helped him to the bathroom and cleaned him up by report. The patient remained very weak. He could not ambulate. He was found yesterday on the floor with no clear syncope. The patient denies dyspnea or chest pain.  Admitted and troponin found to be elevated and cardiology asked to evaluate. Electrocardiogram shows sinus rhythm with prolonged QT interval. Troponin 1.26 but CK 3381. 1 blood culture positive for gram-positive cocci in clusters. It is not clear  patient had syncope. He apparently fell in the floor and  could not get up. He denies loss of consciousness. His troponin is elevated likely secondary to rhabdomyolysis. Patient denies chest pain. We will check an echocardiogram and continue telemetry for 24 hours. If LV function normal and no arrhythmias on telemetry would not pursue further cardiac evaluation. His primary issue is alcohol abuse and prognosis poor if he continues. I instructed him of the importance of avoiding. Kirk Ruths

## 2015-09-03 NOTE — Progress Notes (Signed)
CRITICAL VALUE ALERT  Critical value received:  Gram positive cocci in blood cultures  Date of notification:  09/03/2015   Time of notification:  2:26 PM   Critical value read back:Yes.    Nurse who received alert:  Rocky Morel   MD notified (1st page):  lama  Time of first page:  2:26 PM   MD notified (2nd page):  Time of second page:  Responding MD:    Time MD responded:

## 2015-09-03 NOTE — Progress Notes (Signed)
PT Cancellation Note  Patient Details Name: Joshua Waller MRN: [id number] DOB: 05-16-1951   Cancelled Treatment:    Reason Eval/Treat Not Completed: Patient not medically ready (RN recommends  not attempt OOB today. )   Claretha Cooper 09/03/2015, 2:28 PM Tresa Endo PT [phone]

## 2015-09-03 NOTE — Progress Notes (Signed)
OT Cancellation Note  Patient Details Name: Joshua Waller MRN: [id number] DOB: Apr 30, 1951   Cancelled Treatment:    Reason Eval/Treat Not Completed: Other (comment).  Pt not ready for OOB today  [place] 09/03/2015, 3:50 PM  Lesle Chris, OTR/L [phone] 09/03/2015

## 2015-09-03 NOTE — ED Notes (Signed)
Billy Fischer, MD made aware of patient CG4 lactic result.

## 2015-09-04 DIAGNOSIS — R7881 Bacteremia: Secondary | ICD-10-CM | POA: Diagnosis present

## 2015-09-04 DIAGNOSIS — R8299 Other abnormal findings in urine: Secondary | ICD-10-CM

## 2015-09-04 DIAGNOSIS — R739 Hyperglycemia, unspecified: Secondary | ICD-10-CM

## 2015-09-04 DIAGNOSIS — R9431 Abnormal electrocardiogram [ECG] [EKG]: Secondary | ICD-10-CM | POA: Insufficient documentation

## 2015-09-04 DIAGNOSIS — A419 Sepsis, unspecified organism: Secondary | ICD-10-CM

## 2015-09-04 DIAGNOSIS — I4581 Long QT syndrome: Secondary | ICD-10-CM

## 2015-09-04 DIAGNOSIS — K08409 Partial loss of teeth, unspecified cause, unspecified class: Secondary | ICD-10-CM

## 2015-09-04 DIAGNOSIS — K088 Other specified disorders of teeth and supporting structures: Secondary | ICD-10-CM

## 2015-09-04 LAB — CBC
HCT: 36.8 % — ABNORMAL LOW (ref 39.0–52.0)
HEMOGLOBIN: 13 g/dL (ref 13.0–17.0)
MCH: 30.5 pg (ref 26.0–34.0)
MCHC: 35.3 g/dL (ref 30.0–36.0)
MCV: 86.4 fL (ref 78.0–100.0)
PLATELETS: 49 10*3/uL — AB (ref 150–400)
RBC: 4.26 MIL/uL (ref 4.22–5.81)
RDW: 15.5 % (ref 11.5–15.5)
WBC: 3.1 10*3/uL — AB (ref 4.0–10.5)

## 2015-09-04 LAB — HEPATITIS PANEL, ACUTE
HCV AB: 0.1 {s_co_ratio} (ref 0.0–0.9)
HEP A IGM: NEGATIVE
HEP B S AG: NEGATIVE
Hep B C IgM: NEGATIVE

## 2015-09-04 LAB — COMPREHENSIVE METABOLIC PANEL
ALK PHOS: 26 U/L — AB (ref 38–126)
ALT: 66 U/L — AB (ref 17–63)
AST: 101 U/L — AB (ref 15–41)
Albumin: 2.3 g/dL — ABNORMAL LOW (ref 3.5–5.0)
Anion gap: 10 (ref 5–15)
BUN: 16 mg/dL (ref 6–20)
CALCIUM: 8.3 mg/dL — AB (ref 8.9–10.3)
CHLORIDE: 103 mmol/L (ref 101–111)
CO2: 20 mmol/L — AB (ref 22–32)
CREATININE: 0.55 mg/dL — AB (ref 0.61–1.24)
Glucose, Bld: 159 mg/dL — ABNORMAL HIGH (ref 65–99)
Potassium: 3.3 mmol/L — ABNORMAL LOW (ref 3.5–5.1)
Sodium: 133 mmol/L — ABNORMAL LOW (ref 135–145)
Total Bilirubin: 3.8 mg/dL — ABNORMAL HIGH (ref 0.3–1.2)
Total Protein: 6 g/dL — ABNORMAL LOW (ref 6.5–8.1)

## 2015-09-04 LAB — GLUCOSE, CAPILLARY: Glucose-Capillary: 176 mg/dL — ABNORMAL HIGH (ref 65–99)

## 2015-09-04 LAB — HEMOGLOBIN A1C
Hgb A1c MFr Bld: 6.3 % — ABNORMAL HIGH (ref 4.8–5.6)
Mean Plasma Glucose: 134 mg/dL

## 2015-09-04 LAB — CK: Total CK: 811 U/L — ABNORMAL HIGH (ref 49–397)

## 2015-09-04 MED ORDER — POTASSIUM CHLORIDE 10 MEQ/100ML IV SOLN
10.0000 meq | INTRAVENOUS | Status: AC
  Administered 2015-09-04 (×3): 10 meq via INTRAVENOUS
  Filled 2015-09-04: qty 100

## 2015-09-04 MED ORDER — CEFAZOLIN SODIUM-DEXTROSE 2-3 GM-% IV SOLR
2.0000 g | Freq: Three times a day (TID) | INTRAVENOUS | Status: DC
  Administered 2015-09-04 – 2015-09-10 (×19): 2 g via INTRAVENOUS
  Filled 2015-09-04 (×19): qty 50

## 2015-09-04 NOTE — Evaluation (Signed)
Occupational Therapy Evaluation Patient Details Name: Joshua Waller MRN: [id number] DOB: [birth year] Today's Date: 09/04/2015    History of Present Illness This pt admitted with syncope, increased urinary frequency, diarrhea, right leg weakness, pain over both thighs, AMS. Found to have UTI and rhabdomyolysis, right anterior 4th/5th rib fxs, CT/MRI negative for any acute strokes. PMHx:alcohol abuse, Mallory-Weiss tear, seizure, thrombocytopenia, gastric ulcer, GI bleeding, OA (neck and back), and neuropathy   Clinical Impression   This 64 yo male admitted with above presents to acute OT with decreased mobility, decreased balance, decreased cognition, increased pain, decreased movement/use of right side due to pain all affecting his ability to care for himself at what is believed to be an independent level pta. He will benefit from acute OT with follow up OT at SNF.    Follow Up Recommendations  SNF    Equipment Recommendations   (TBD next venue)       Precautions / Restrictions Precautions Precautions: Fall Precaution Comments: right anterior 4th/5th rib fxs Restrictions Weight Bearing Restrictions: No      Mobility Bed Mobility Overal bed mobility: Needs Assistance;+2 for physical assistance Bed Mobility: Supine to Sit;Sit to Supine     Supine to sit: Max assist;+2 for physical assistance;HOB elevated Sit to supine: Total assist;+2 for physical assistance      Transfers Overall transfer level: Needs assistance   Transfers: Sit to/from Stand;Squat Pivot Transfers Sit to Stand: Mod assist;+2 physical assistance (partial stand)   Squat pivot transfers: Max assist;+2 physical assistance     General transfer comment: Sit<>stand x2 from bed with recliner in front of him; sit<>stand x1 with RW; squat pivot right<>left x2 (bed<>3n1)    Balance Overall balance assessment: Needs assistance Sitting-balance support: Single extremity supported;Feet supported Sitting  balance-Leahy Scale: Fair     Standing balance support: Bilateral upper extremity supported Standing balance-Leahy Scale: Zero                              ADL Overall ADL's : Needs assistance/impaired Eating/Feeding: Moderate assistance;Sitting   Grooming: Maximal assistance;Sitting   Upper Body Bathing: Moderate assistance;Sitting   Lower Body Bathing: Total assistance (with Mod A +2 partial sit<>stand)   Upper Body Dressing : Maximal assistance;Sitting   Lower Body Dressing: Total assistance (with Mod A +2 partial sit<>stand)   Toilet Transfer: +2 for physical assistance;Maximal assistance;Stand-pivot;BSC   Toileting- Clothing Manipulation and Hygiene: Total assistance (with Mod A +2 partial sit<>stand)               Vision Additional Comments: No change from baseline          Pertinent Vitals/Pain Pain Assessment: Faces Faces Pain Scale: Hurts whole lot Pain Location: right arm and leg with helping him move them Pain Descriptors / Indicators: Sore;Grimacing Pain Intervention(s): Limited activity within patient's tolerance;Monitored during session;Repositioned     Hand Dominance  right (but can use both)   Extremity/Trunk Assessment Upper Extremity Assessment Upper Extremity Assessment: RUE deficits/detail RUE Deficits / Details: Decreased AROM and tolerance for AAROM for elbow and shoulder--does much better pain wise if he moves it for himself. Also not able to hold a cup in right hand and bring to mouth RUE Coordination: decreased fine motor;decreased gross motor   Lower Extremity Assessment Lower Extremity Assessment: Defer to PT evaluation       Communication Communication Communication:  (intially really hard to understand, but as he became more awake his speech was more  intelligible)   Cognition Arousal/Alertness: Awake/alert Behavior During Therapy: WFL for tasks assessed/performed Overall Cognitive Status: Impaired/Different from  baseline Area of Impairment: Orientation;Following commands Orientation Level: Disoriented to;Place (initially. Oriented him to where he was at beginning of session and at end of session he told us he was at [facility])     Following Commands: Follows one step commands with increased time                      Home Living Family/patient expects to be discharged to:: Skilled nursing facility Living Arrangements: Non-relatives/Friends Available Help at Discharge:  (unknown, says he lives in a room in a house) Type of Home: House                                  Prior Functioning/Environment Level of Independence: Independent             OT Diagnosis: Generalized weakness;Cognitive deficits;Acute pain   OT Problem List: Decreased strength;Decreased range of motion;Impaired balance (sitting and/or standing);Pain;Impaired UE functional use;Decreased cognition;Decreased coordination;Obesity;Decreased knowledge of use of DME or AE   OT Treatment/Interventions: Self-care/ADL training;Patient/family education;Balance training;Therapeutic exercise;Therapeutic activities;Cognitive remediation/compensation;DME and/or AE instruction    OT Goals(Current goals can be found in the care plan section) Acute Rehab OT Goals Patient Stated Goal: more water to drink (he had 3 cups while we were with him) OT Goal Formulation: With patient Time For Goal Achievement: 09/18/15 Potential to Achieve Goals: Good  OT Frequency: Min 2X/week   Barriers to D/C: Decreased caregiver support          Co-evaluation PT/OT/SLP Co-Evaluation/Treatment: Yes Reason for Co-Treatment: Necessary to address cognition/behavior during functional activity;For patient/therapist safety   OT goals addressed during session: ADL's and self-care;Strengthening/ROM      End of Session Equipment Utilized During Treatment: Rolling walker  Activity Tolerance: Patient limited by pain Patient left: in  bed;with call bell/phone within reach;with bed alarm set   Time: 1220-1302 OT Time Calculation (min): 42 min Charges:  OT General Charges $OT Visit: 1 Procedure OT Evaluation $Initial OT Evaluation Tier I: 1 Procedure OT Treatments $Self Care/Home Management : 8-22 mins  Almon Register [phone] 09/04/2015, 1:50 PM

## 2015-09-04 NOTE — Evaluation (Signed)
Physical Therapy Evaluation Patient Details Name: Joshua Waller MRN: [id number] DOB: November 13, 1951 Today's Date: 09/04/2015   History of Present Illness  This pt admitted with syncope, increased urinary frequency, diarrhea, right leg weakness, pain over both thighs, AMS. Found to have UTI and rhabdomyolysis, right anterior 4th/5th rib fxs, CT/MRI negative for any acute strokes. PMHx:alcohol abuse, Mallory-Weiss tear, seizure, thrombocytopenia, gastric ulcer, GI bleeding, OA (neck and back), and neuropathy  Clinical Impression  Patient pleasant and cooperative, RUE is painful and not useful fior transfers, c/o R side pain and both legs. Patient will benefit from PT to address problems listed in note below.    Follow Up Recommendations SNF;Supervision/Assistance - 24 hour    Equipment Recommendations  None recommended by PT    Recommendations for Other Services       Precautions / Restrictions Precautions Precautions: Fall Precaution Comments: right anterior 4th/5th rib fxs, RUE is very painful Restrictions Weight Bearing Restrictions: No      Mobility  Bed Mobility Overal bed mobility: Needs Assistance;+2 for physical assistance Bed Mobility: Supine to Sit;Sit to Supine     Supine to sit: Max assist;+2 for physical assistance;HOB elevated Sit to supine: Total assist;+2 for physical assistance   General bed mobility comments: lifting assist to power up trunk, bed pad used to move around to edge, total of 2 back into nbed.  Transfers Overall transfer level: Needs assistance Equipment used: Standard walker Transfers: Sit to/from Stand Sit to Stand: Mod assist;+2 physical assistance;+2 safety/equipment   Squat pivot transfers: Max assist;+2 physical assistance;+2 safety/equipment     General transfer comment: Sit<>stand x2 from bed with recliner in front of him; sit<>stand x1 with SW; squat pivot right<>left x2 (bed<>3n1)  Ambulation/Gait                Stairs             Wheelchair Mobility    Modified Rankin (Stroke Patients Only)       Balance Overall balance assessment: Needs assistance Sitting-balance support: Single extremity supported;Feet supported Sitting balance-Leahy Scale: Fair     Standing balance support: Bilateral upper extremity supported Standing balance-Leahy Scale: Zero                               Pertinent Vitals/Pain Pain Assessment: Faces Faces Pain Scale: Hurts whole lot Pain Location: RUE, elbow Pain Descriptors / Indicators: Discomfort;Grimacing;Guarding Pain Intervention(s): Limited activity within patient's tolerance;Monitored during session    [employer] expects to be discharged to:: Skilled nursing facility Living Arrangements: Non-relatives/Friends Available Help at Discharge:  (unknown, says he lives in a room in a house) Type of Home: House                Prior Function Level of Independence: Independent               Hand Dominance   Dominant Hand: Left    Extremity/Trunk Assessment   Upper Extremity Assessment: Defer to OT evaluation RUE Deficits / Details: Decreased AROM and tolerance for AAROM for elbow and shoulder--does much better pain wise if he moves it for himself. Also not able to hold a cup in right hand and bring to mouth         Lower Extremity Assessment: LLE deficits/detail;RLE deficits/detail RLE Deficits / Details: decreased knee flexion, rports pain in the knee and ankle LLE Deficits / Details: painful in the  knee, noted bruising  Communication   Communication: No difficulties  Cognition Arousal/Alertness: Awake/alert Behavior During Therapy: WFL for tasks assessed/performed Overall Cognitive Status: Impaired/Different from baseline Area of Impairment: Orientation;Following commands Orientation Level: Disoriented to;Place (initially. Oriented him to where he was at beginning of session and at end of session he told us he  was at [facility])     Following Commands: Follows one step commands with increased time            General Comments      Exercises        Assessment/Plan    PT Assessment Patient needs continued PT services  PT Diagnosis Difficulty walking;Acute pain;Altered mental status   PT Problem List Decreased strength;Decreased activity tolerance;Decreased mobility;Decreased knowledge of precautions;Decreased cognition;Decreased safety awareness;Pain  PT Treatment Interventions DME instruction;Gait training;Functional mobility training;Therapeutic activities;Therapeutic exercise;Patient/family education   PT Goals (Current goals can be found in the Care Plan section) Acute Rehab PT Goals Patient Stated Goal: more water to drink (he had 3 cups while we were with him) PT Goal Formulation: With patient Time For Goal Achievement: 09/18/15 Potential to Achieve Goals: Good    Frequency Min 3X/week   Barriers to discharge Decreased caregiver support      Co-evaluation PT/OT/SLP Co-Evaluation/Treatment: Yes Reason for Co-Treatment: For patient/therapist safety;Necessary to address cognition/behavior during functional activity PT goals addressed during session: Mobility/safety with mobility OT goals addressed during session: ADL's and self-care;Strengthening/ROM       End of Session Equipment Utilized During Treatment: Gait belt Activity Tolerance: Patient limited by pain;Patient limited by fatigue Patient left: in bed;with call bell/phone within reach;with bed alarm set Nurse Communication: Mobility status         Time: 4536-4680 PT Time Calculation (min) (ACUTE ONLY): 35 min   Charges:   PT Evaluation $Initial PT Evaluation Tier I: 1 Procedure     PT G CodesClaretha Cooper 09/04/2015, 2:08 PM Tresa Endo PT [phone]

## 2015-09-04 NOTE — Progress Notes (Signed)
Patient Name: Joshua Waller Date of Encounter: 09/04/2015  Primary Cardiologist: new - Dr. Stanford Breed   Principal Problem:   UTI (lower urinary tract infection) Active Problems:   ETOH abuse   Neuropathy   Gastric ulcer   Weakness of right leg   Hyponatremia   Thrombocytopenia   Sepsis   Abnormal LFTs   Mediastinal widening   Acute encephalopathy   Syncope   Rhabdomyolysis   Elevated troponin    SUBJECTIVE  Sore all over, pain with any movement in all joints. Denies SOB.   CURRENT MEDS . atorvastatin  40 mg Oral q1800  .  ceFAZolin (ANCEF) IV  2 g Intravenous 3 times per day  . famotidine (PEPCID) IV  20 mg Intravenous Q12H  . folic acid  1 mg Oral Daily  . gabapentin  600 mg Oral TID  . LORazepam  0-4 mg Intravenous Q6H   Followed by  . [START ON 09/05/2015] LORazepam  0-4 mg Intravenous Q12H  . multivitamin with minerals  1 tablet Oral Daily  . sodium chloride  3 mL Intravenous Q12H  . thiamine  100 mg Oral Daily   Or  . thiamine  100 mg Intravenous Daily  . vancomycin  1,250 mg Intravenous Q12H    OBJECTIVE  Filed Vitals:   09/04/15 0400 09/04/15 0500 09/04/15 0600 09/04/15 0800  BP: 124/65   132/63  Pulse: 109 113 113 102  Temp:    98.5 F (36.9 C)  TempSrc:    Oral  Resp: 31 31 31 29   Height:      Weight:      SpO2: 95% 94% 93% 94%    Intake/Output Summary (Last 24 hours) at 09/04/15 1314 Last data filed at 09/04/15 0600  Gross per 24 hour  Intake   1420 ml  Output    700 ml  Net    720 ml   Filed Weights   09/02/15 2155 09/03/15 0336  Weight: 200 lb (90.719 kg) 213 lb 6.5 oz (96.8 kg)    PHYSICAL EXAM  General: Pleasant, NAD. Neuro: Alert and oriented X 3. Moves all extremities spontaneously. Psych: Normal affect. HEENT:  Normal  Neck: Supple without bruits or JVD. Lungs:  Resp regular and unlabored, anterior exam CTA. Heart: RRR no s3, s4, or murmurs. Abdomen: Soft, non-tender, non-distended, BS + x 4.  Extremities: No clubbing,  cyanosis or edema. DP/PT/Radials 2+ and equal bilaterally.  Accessory Clinical Findings  CBC  Recent Labs  09/03/15 0011 09/03/15 0620 09/04/15 0357  WBC 5.3 4.4 3.1*  NEUTROABS 4.3  --   --   HGB 14.8 13.0 13.0  HCT 41.8 36.2* 36.8*  MCV 85.0 84.6 86.4  PLT 46* 42* 49*   Basic Metabolic Panel  Recent Labs  09/03/15 0620 09/04/15 0357  NA 127* 133*  K 3.3* 3.3*  CL 97* 103  CO2 19* 20*  GLUCOSE 159* 159*  BUN 17 16  CREATININE 0.55* 0.55*  CALCIUM 8.2* 8.3*   Liver Function Tests  Recent Labs  09/03/15 0620 09/04/15 0357  AST 157* 101*  ALT 79* 66*  ALKPHOS 25* 26*  BILITOT 4.8* 3.8*  PROT 6.4* 6.0*  ALBUMIN 2.5* 2.3*   Cardiac Enzymes  Recent Labs  09/03/15 0011 09/03/15 0620 09/03/15 1300 09/03/15 1820 09/04/15 0357  CKTOTAL 3381* 2552*  --   --  811*  TROPONINI 1.26* 0.84* 0.59* 0.49*  --    Hemoglobin A1C  Recent Labs  09/03/15 0620  HGBA1C 6.3*  Fasting Lipid Panel  Recent Labs  09/03/15 0620  CHOL 81  HDL <10*  LDLCALC NOT CALCULATED  TRIG 144  CHOLHDL NOT CALCULATED   Thyroid Function Tests  Recent Labs  09/03/15 0620  TSH 1.338    TELE Sinus tach with HR 90-100s, HR tapering down to 80s now    ECG  No new EKG  Echocardiogram 09/03/2015  LV EF: 55% -  60%  ------------------------------------------------------------------- Indications:   Syncope 780.2.  ------------------------------------------------------------------- Study Conclusions  - Left ventricle: The cavity size was normal. Systolic function was normal. The estimated ejection fraction was in the range of 55% to 60%. Wall motion was normal; there were no regional wall motion abnormalities. - Atrial septum: No defect or patent foramen ovale was identified.    Radiology/Studies  Dg Chest 2 View  09/03/2015   CLINICAL DATA:  64 year old male with fall and fever.  EXAM: CHEST  2 VIEW  COMPARISON:  Chest radiograph dated 09/25/2013 all   FINDINGS: There is widening of the cardiomediastinal silhouette, significantly increased from prior study. This may be partially related to shallow inspiratory effort and imaging technique. Underlying Traumatic aortic or mediastinal injury is not excluded. Clinical correlation recommended. CT with contrast may provide better evaluation there is clinical concern for traumatic aortic or mediastinal injury. There are bibasilar linear atelectatic changes. There is no focal consolidation, pleural effusion, or pneumothorax. There are degenerative changes of the shoulders. No acute fracture.  IMPRESSION: Enlarged cardiomediastinal silhouette. Underlying traumatic mediastinal injury is not excluded. Clinical correlation is recommended. CT of the chest may provide better evaluation if clinically indicated.  No focal consolidation.  These results were called by telephone at the time of interpretation on 09/03/2015 at 1:09 am to Dr. Gareth Morgan , who verbally acknowledged these results.   Electronically Signed   By: Anner Crete M.D.   On: 09/03/2015 01:09   Ct Head Wo Contrast  09/03/2015   CLINICAL DATA:  64 year old male with fall.  EXAM: CT HEAD WITHOUT CONTRAST  TECHNIQUE: Contiguous axial images were obtained from the base of the skull through the vertex without intravenous contrast.  COMPARISON:  CT dated 03/01/2015  FINDINGS: There is stable dilatation of the ventricles out of proportion with the sulci which may represent central volume loss versus normal pressure hydrocephalus. Clinical correlation is recommended. Periventricular and deep white matter hypodensities represent chronic microvascular ischemic changes. Stable areas of bifrontal old infarct and encephalomalacia changes noted. There is no intracranial hemorrhage. No mass effect or midline shift identified.  The visualized paranasal sinuses and mastoid air cells are well aerated. The calvarium is intact.  IMPRESSION: No acute intracranial pathology.   Age-related atrophy and chronic microvascular ischemic disease. 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   Electronically Signed   By: Anner Crete M.D.   On: 09/03/2015 02:17   Ct Chest W Contrast  09/03/2015   CLINICAL DATA:  64 year old male with recent fall and widened mediastinum on chest x-ray.  EXAM: CT CHEST WITH CONTRAST  TECHNIQUE: Multidetector CT imaging of the chest was performed during intravenous contrast administration.  CONTRAST:  25mL OMNIPAQUE IOHEXOL 300 MG/ML  SOLN  COMPARISON:  Chest radiograph dated 09/03/2015  FINDINGS: There is subsegmental right lung base consolidative changes, likely atelectasis. Pneumonia is less likely. Trace right pleural effusion may be present. There is no pneumothorax. The central airways are patent.  The thoracic aorta is unremarkable. There is mild prominence of the main pulmonary trunk indicative of a degree of pulmonary hypertension. There is  mild cardiomegaly. No significant pericardial effusion. Along the anterior and posterior. Ascending aorta represent pericardial recess. There is coronary vascular calcification. No hilar or mediastinal adenopathy. The visualized thyroid gland appears unremarkable. The esophagus is collapsed.  There is no axillary adenopathy. The chest wall soft tissues appear unremarkable. There is osteopenia with degenerative changes of the left humeral head. Right anterior fourth and fifth rib fractures noted.  A cirrhosis with evidence of portal hypertension, splenomegaly and upper abdominal varices. High attenuating debris within the gallbladder may represent sludge or small stones or vicarious excretion of contrast.  IMPRESSION: Right anterior fourth and fifth rib fractures.  No pneumothorax.  No acute/traumatic intrathoracic or mediastinal injury.   Electronically Signed   By: Anner Crete M.D.   On: 09/03/2015 03:05   Mr Brain Wo Contrast  09/03/2015   CLINICAL DATA:  Altered mental status. Alcohol abuse. Fall at home.  EXAM: MRI  HEAD WITHOUT CONTRAST  TECHNIQUE: Multiplanar, multiecho pulse sequences of the brain and surrounding structures were obtained without intravenous contrast.  COMPARISON:  Head CT from yesterday  FINDINGS: Motion degraded study, requiring fast brain protocol, but overall diagnostic.  Calvarium and upper cervical spine: No focal marrow signal abnormality. Transverse ligamentous thickening without foramen magnum stenosis.  Orbits: No significant findings.  Sinuses and Mastoids: Clear. Mastoid and middle ears are clear.  Brain: No acute abnormality such as acute infarct, hemorrhage, hydrocephalus, or mass lesion. No evidence of large vessel occlusion.  Bifrontal and right more than left temporal pole encephalomalacia and hemosiderin staining, posttraumatic pattern. There is superimposed chronic small vessel disease with ischemic gliosis throughout the bilateral cerebral white matter. Atrophy is advanced, especially for age, and when accounting for areas of cystic encephalomalacia is generalized. Cerebellar involvement, potentially exacerbated by history of ethanol use. Prominent ventriculomegaly, including the temporal horns. Given the degree of corpus callosum thinning and cortical atrophy, this is considered secondary to volume loss rather than communicating hydrocephalus.  IMPRESSION: 1. Motion degraded study without acute finding. 2. 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   Electronically Signed   By: Monte Fantasia M.D.   On: 09/03/2015 07:36   US Abdomen Complete  09/03/2015   CLINICAL DATA:  Abnormal liver enzymes. Alcohol abuse. GI bleeding.  EXAM: ULTRASOUND ABDOMEN COMPLETE  COMPARISON:  Ultrasound dated 09/07/2013  FINDINGS: Gallbladder: No gallstones or wall thickening visualized. No sonographic Murphy sign noted.  Common bile duct: Diameter: 3.6 mm, normal.  Liver: Liver parenchyma is nodular and coarse consistent with cirrhosis.  IVC: No abnormality  visualized.  Pancreas: Not visualized.  Spleen: Splenomegaly, unchanged.  Right Kidney: Length: 11.8 cm. Echogenicity within normal limits. No mass or hydronephrosis visualized.  Left Kidney: Length: 13.5 cm. Echogenicity within normal limits. No mass or hydronephrosis visualized.  Abdominal aorta: No aneurysm visualized.  Maximal diameter 2.7 cm.  Other findings: Suggestion of a tiny amount of ascites.  IMPRESSION: Tiny amount of ascites. Cirrhosis. Chronic splenomegaly. Pancreas is obscured by bowel.   Electronically Signed   By: Lorriane Shire M.D.   On: 09/03/2015 16:18   Dg Femur Min 2 Views Left  09/03/2015   CLINICAL DATA:  64 year old male with right leg weakness  EXAM: LEFT FEMUR 2 VIEWS  COMPARISON:  Right leg radiograph dated 09/03/2015.  FINDINGS: There is no evidence of fracture or other focal bone lesions. Soft tissues are unremarkable.  IMPRESSION: Negative.   Electronically Signed   By: Anner Crete M.D.   On: 09/03/2015 03:22  Dg Femur, Min 2 Views Right  09/03/2015   CLINICAL DATA:  64 year old male with weakness of the right lower extremity.  EXAM: RIGHT FEMUR 2 VIEWS  COMPARISON:  None.  FINDINGS: No acute fracture or dislocation. There is a focal area osteopenia involving the lateral femoral condyle seen on the lateral projection. The soft tissues are unremarkable.  IMPRESSION: No acute fracture or dislocation.   Electronically Signed   By: Anner Crete M.D.   On: 09/03/2015 03:15    ASSESSMENT AND PLAN  64 yo male with h/o alcohol abuse, mallory-weiss tear, seizure, thrombocytopenia, gastric ulcer, GI bleeding who presented to [facility] with syncope, urinary frequent and AMS, found to have urosepsis and fever. Also has rhabdomyolysis with CK 3381. Trop 1.26. Lactic acid 3.56  1. Syncope - unclear if he truly had syncope  - CT of head negative. MR of brain motion degraded study without acute finding. Advanced brain atrophy with superimposed traumatic pattern frontal temporal  encephalomalacia.   2. Elevated trop in the setting of syncope and rhabdomyolosys  - trop 1.26 --> 0.84 --> 0.59 --> 0.49. Total CK >3000 on arrival.   - Echo 09/03/2015 EF 55-60%, no RWMA. TSH normal  - no ASA given chronic ETOH abuse and platelet 40s  - will discuss with MD, elevated trop likely in the setting of rhabdo, echo normal EF without wall motion abnormality, very unlikely to need ischemic workup.  - QTC prolonged on initial EKG, avoid QTC prolonging medication, recheck EKG tomorrow AM to monitor QTc level.   3. Urosepsis: per IM  4. Alcohol abuse: CIWA protol. 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 H/o gastric ulcer and GIB  6. Hyponatremia: likely related to ETOH  7. Abnormal LFT: ALP 31, AST 206. ALT 92, total bilirubin 5.3. 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 AMS  9. Thrombocytopenia: platelet 40s, unclear cause   Signed, Woodward Ku Pager: [phone]  I have seen and examined the patient along with Almyra Deforest PA-C.  I have reviewed the chart, notes and new data.  I agree with PA's note.   PLAN: Troponin elevation pattern is not suggestive of true acute coronary event and is occurring due to the same reason as the skeletal muscle injury. With normal wall motion on echo, no plan for ischemic workup.  QT prolongation is related to metabolic abnormalities and expect it will correct once these resolve.  Sanda Klein, MD, [place] [phone] 09/04/2015, 2:53 PM

## 2015-09-04 NOTE — Progress Notes (Signed)
[facility] PROGRESS NOTE  Fields Oros ASN:[id number] DOB: [birth year] DOA: 09/02/2015 PCP: Leonard Downing, MD  Assessment/Plan: 1. Bacteremia- blood cultures total 2/2 bottles growing gram-positive cocci in clusters. Antibiotic changed to IV Cefazolin per ID. Continue vancomycin per pharmacy consultation 2. UTI- continue cefazolin. Urine culture growing staph aureus. Will follow the final report. 3. Rhabdomyolysis- patient presented with CK of 3381, after IV fluids today CK is 811. Follow IV fluids. 4. Elevated troponin- patient presented with relative troponin, which has come down to 0.49. Likely from rhabdomyolysis. Cardiology following. 5. Hyponatremia- resolved, today sodium 133. Likely from dehydration. 6. Hypokalemia- will replace potassium, and check BMP in a.m. 7. Transaminitis/liver cirrhosis- secondary to liver cirrhosis, liver enzymes are improving today AST is 101, ALT 66, alkaline phosphatase 26. 8. Encephalopathy- likely multifactorial from alcohol abuse, UTI, rhabdomyolysis. Continue supportive management. Ammonia level 26. 9. Alcohol abuse- continue CIWA protocol 10. 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 11. Syncope- patient unable to provide any significant history, likely multifactorial. Cardiology following.  Code Status: Full code Family Communication: No family at bedside Disposition Plan: SNF   Consultants:  cardiology   Procedures:  None  Antibiotics:  Vancomycin  Zosyn  Cefazolin  HPI/Subjective: 64 y.o. male with a history of alcohol abuse, Mallory-Weiss tear, seizures, thrombocytopenia, gastric ulcer with GI bleeding who presents with altered bowel complaints. He was slightly confused on arrival and was a difficult historian. He complains of diarrhea for 5 days. He passed out at home on Sunday. Her EMS report the patient was not ambulating at home well and having multiple falls. In the ER  he was found to be febrile with a temp of 101.4, tachycardic and had a lactic acid of 3.6. UA was concerning for UTI. Sodium was 127 and LFTs were elevated. He e was started on vancomycin and Zosyn.  Patient continues to be confused.  Objective: Filed Vitals:   09/04/15 0800  BP: 132/63  Pulse: 102  Temp: 98.5 F (36.9 C)  Resp: 29    Intake/Output Summary (Last 24 hours) at 09/04/15 1405 Last data filed at 09/04/15 0600  Gross per 24 hour  Intake   1170 ml  Output    700 ml  Net    470 ml   Filed Weights   09/02/15 2155 09/03/15 0336  Weight: 90.719 kg (200 lb) 96.8 kg (213 lb 6.5 oz)    Exam:   General:  Confused  Cardiovascular: S1S2 RRR  Respiratory: Clear bilaterally, no wheezing  Abdomen: Soft, nontender, no organomegaly  Musculoskeletal: No edema  Data Reviewed: Basic Metabolic Panel:  Recent Labs Lab 09/03/15 0011 09/03/15 0620 09/04/15 0357  NA 127* 127* 133*  K 3.9 3.3* 3.3*  CL 93* 97* 103  CO2 21* 19* 20*  GLUCOSE 174* 159* 159*  BUN 21* 17 16  CREATININE 0.63 0.55* 0.55*  CALCIUM 8.7* 8.2* 8.3*   Liver Function Tests:  Recent Labs Lab 09/03/15 0011 09/03/15 0620 09/04/15 0357  AST 206* 157* 101*  ALT 92* 79* 66*  ALKPHOS 31* 25* 26*  BILITOT 5.3* 4.8* 3.8*  PROT 7.2 6.4* 6.0*  ALBUMIN 3.0* 2.5* 2.3*   No results for input(s): LIPASE, AMYLASE in the last 168 hours.  Recent Labs Lab 09/03/15 0620  AMMONIA 26   CBC:  Recent Labs Lab 09/03/15 0011 09/03/15 0620 09/04/15 0357  WBC 5.3 4.4 3.1*  NEUTROABS 4.3  --   --   HGB 14.8 13.0 13.0  HCT 41.8 36.2*  36.8*  MCV 85.0 84.6 86.4  PLT 46* 42* 49*   Cardiac Enzymes:  Recent Labs Lab 09/03/15 0011 09/03/15 0620 09/03/15 1300 09/03/15 1820 09/04/15 0357  CKTOTAL 3381* 2552*  --   --  811*  TROPONINI 1.26* 0.84* 0.59* 0.49*  --    BNP (last 3 results) No results for input(s): BNP in the last 8760 hours.  ProBNP (last 3 results) No results for input(s):  PROBNP in the last 8760 hours.  CBG:  Recent Labs Lab 09/03/15 0821 09/04/15 0733  GLUCAP 146* 176*    Recent Results (from the past 240 hour(s))  Blood culture (routine x 2)     Status: None (Preliminary result)   Collection Time: 09/03/15 12:11 AM  Result Value Ref Range Status   Specimen Description BLOOD RIGHT HAND  Final   Special Requests BOTTLES DRAWN AEROBIC AND ANAEROBIC 5CC  Final   Culture  Setup Time   Final    GRAM POSITIVE COCCI IN CLUSTERS IN BOTH AEROBIC AND ANAEROBIC BOTTLES CRITICAL RESULT CALLED TO, READ BACK BY AND VERIFIED WITH: S HOFFLER,RN AT 1426 09/03/15 BY L BENFIELD    Culture   Final    STAPHYLOCOCCUS AUREUS Performed at [facility]    Report Status PENDING  Incomplete  Blood culture (routine x 2)     Status: None (Preliminary result)   Collection Time: 09/03/15 12:11 AM  Result Value Ref Range Status   Specimen Description BLOOD LEFT HAND  Final   Special Requests BOTTLES DRAWN AEROBIC AND ANAEROBIC 5CC  Final   Culture  Setup Time   Final    GRAM POSITIVE COCCI IN CLUSTERS IN BOTH AEROBIC AND ANAEROBIC BOTTLES CRITICAL RESULT CALLED TO, READ BACK BY AND VERIFIED WITH: S HOFFLER,RN AT 1426 09/03/15 BY L BENFIELD    Culture   Final    STAPHYLOCOCCUS AUREUS Performed at [facility]    Report Status PENDING  Incomplete  Urine culture     Status: None (Preliminary result)   Collection Time: 09/03/15 12:24 AM  Result Value Ref Range Status   Specimen Description URINE, CATHETERIZED  Final   Special Requests NONE  Final   Culture   Final    >=100,000 COLONIES/mL STAPHYLOCOCCUS AUREUS Performed at [facility]    Report Status PENDING  Incomplete  MRSA PCR Screening     Status: None   Collection Time: 09/03/15  3:53 AM  Result Value Ref Range Status   MRSA by PCR NEGATIVE NEGATIVE Final    Comment:        The GeneXpert MRSA Assay (FDA approved for NASAL specimens only), is one component of a comprehensive MRSA  colonization surveillance program. It is not intended to diagnose MRSA infection nor to guide or monitor treatment for MRSA infections.      Studies: Dg Chest 2 View  09/03/2015   CLINICAL DATA:  64 year old male with fall and fever.  EXAM: CHEST  2 VIEW  COMPARISON:  Chest radiograph dated 09/25/2013 all  FINDINGS: There is widening of the cardiomediastinal silhouette, significantly increased from prior study. This may be partially related to shallow inspiratory effort and imaging technique. Underlying Traumatic aortic or mediastinal injury is not excluded. Clinical correlation recommended. CT with contrast may provide better evaluation there is clinical concern for traumatic aortic or mediastinal injury. There are bibasilar linear atelectatic changes. There is no focal consolidation, pleural effusion, or pneumothorax. There are degenerative changes of the shoulders. No acute fracture.  IMPRESSION: Enlarged cardiomediastinal  silhouette. Underlying traumatic mediastinal injury is not excluded. Clinical correlation is recommended. CT of the chest may provide better evaluation if clinically indicated.  No focal consolidation.  These results were called by telephone at the time of interpretation on 09/03/2015 at 1:09 am to Dr. Gareth Morgan , who verbally acknowledged these results.   Electronically Signed   By: Anner Crete M.D.   On: 09/03/2015 01:09   Ct Head Wo Contrast  09/03/2015   CLINICAL DATA:  64 year old male with fall.  EXAM: CT HEAD WITHOUT CONTRAST  TECHNIQUE: Contiguous axial images were obtained from the base of the skull through the vertex without intravenous contrast.  COMPARISON:  CT dated 03/01/2015  FINDINGS: There is stable dilatation of the ventricles out of proportion with the sulci which may represent central volume loss versus normal pressure hydrocephalus. Clinical correlation is recommended. Periventricular and deep white matter hypodensities represent chronic microvascular  ischemic changes. Stable areas of bifrontal old infarct and encephalomalacia changes noted. There is no intracranial hemorrhage. No mass effect or midline shift identified.  The visualized paranasal sinuses and mastoid air cells are well aerated. The calvarium is intact.  IMPRESSION: No acute intracranial pathology.  Age-related atrophy and chronic microvascular ischemic disease. 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   Electronically Signed   By: Anner Crete M.D.   On: 09/03/2015 02:17   Ct Chest W Contrast  09/03/2015   CLINICAL DATA:  64 year old male with recent fall and widened mediastinum on chest x-ray.  EXAM: CT CHEST WITH CONTRAST  TECHNIQUE: Multidetector CT imaging of the chest was performed during intravenous contrast administration.  CONTRAST:  58mL OMNIPAQUE IOHEXOL 300 MG/ML  SOLN  COMPARISON:  Chest radiograph dated 09/03/2015  FINDINGS: There is subsegmental right lung base consolidative changes, likely atelectasis. Pneumonia is less likely. Trace right pleural effusion may be present. There is no pneumothorax. The central airways are patent.  The thoracic aorta is unremarkable. There is mild prominence of the main pulmonary trunk indicative of a degree of pulmonary hypertension. There is mild cardiomegaly. No significant pericardial effusion. Along the anterior and posterior. Ascending aorta represent pericardial recess. There is coronary vascular calcification. No hilar or mediastinal adenopathy. The visualized thyroid gland appears unremarkable. The esophagus is collapsed.  There is no axillary adenopathy. The chest wall soft tissues appear unremarkable. There is osteopenia with degenerative changes of the left humeral head. Right anterior fourth and fifth rib fractures noted.  A cirrhosis with evidence of portal hypertension, splenomegaly and upper abdominal varices. High attenuating debris within the gallbladder may represent sludge or small stones or vicarious excretion of contrast.   IMPRESSION: Right anterior fourth and fifth rib fractures.  No pneumothorax.  No acute/traumatic intrathoracic or mediastinal injury.   Electronically Signed   By: Anner Crete M.D.   On: 09/03/2015 03:05   Mr Brain Wo Contrast  09/03/2015   CLINICAL DATA:  Altered mental status. Alcohol abuse. Fall at home.  EXAM: MRI HEAD WITHOUT CONTRAST  TECHNIQUE: Multiplanar, multiecho pulse sequences of the brain and surrounding structures were obtained without intravenous contrast.  COMPARISON:  Head CT from yesterday  FINDINGS: Motion degraded study, requiring fast brain protocol, but overall diagnostic.  Calvarium and upper cervical spine: No focal marrow signal abnormality. Transverse ligamentous thickening without foramen magnum stenosis.  Orbits: No significant findings.  Sinuses and Mastoids: Clear. Mastoid and middle ears are clear.  Brain: No acute abnormality such as acute infarct, hemorrhage, hydrocephalus, or mass lesion. No evidence of large vessel occlusion.  Bifrontal and right more than left temporal pole encephalomalacia and hemosiderin staining, posttraumatic pattern. There is superimposed chronic small vessel disease with ischemic gliosis throughout the bilateral cerebral white matter. Atrophy is advanced, especially for age, and when accounting for areas of cystic encephalomalacia is generalized. Cerebellar involvement, potentially exacerbated by history of ethanol use. Prominent ventriculomegaly, including the temporal horns. Given the degree of corpus callosum thinning and cortical atrophy, this is considered secondary to volume loss rather than communicating hydrocephalus.  IMPRESSION: 1. Motion degraded study without acute finding. 2. 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   Electronically Signed   By: Monte Fantasia M.D.   On: 09/03/2015 07:36   US Abdomen Complete  09/03/2015   CLINICAL DATA:  Abnormal liver enzymes. Alcohol abuse. GI  bleeding.  EXAM: ULTRASOUND ABDOMEN COMPLETE  COMPARISON:  Ultrasound dated 09/07/2013  FINDINGS: Gallbladder: No gallstones or wall thickening visualized. No sonographic Murphy sign noted.  Common bile duct: Diameter: 3.6 mm, normal.  Liver: Liver parenchyma is nodular and coarse consistent with cirrhosis.  IVC: No abnormality visualized.  Pancreas: Not visualized.  Spleen: Splenomegaly, unchanged.  Right Kidney: Length: 11.8 cm. Echogenicity within normal limits. No mass or hydronephrosis visualized.  Left Kidney: Length: 13.5 cm. Echogenicity within normal limits. No mass or hydronephrosis visualized.  Abdominal aorta: No aneurysm visualized.  Maximal diameter 2.7 cm.  Other findings: Suggestion of a tiny amount of ascites.  IMPRESSION: Tiny amount of ascites. Cirrhosis. Chronic splenomegaly. Pancreas is obscured by bowel.   Electronically Signed   By: Lorriane Shire M.D.   On: 09/03/2015 16:18   Dg Femur Min 2 Views Left  09/03/2015   CLINICAL DATA:  64 year old male with right leg weakness  EXAM: LEFT FEMUR 2 VIEWS  COMPARISON:  Right leg radiograph dated 09/03/2015.  FINDINGS: There is no evidence of fracture or other focal bone lesions. Soft tissues are unremarkable.  IMPRESSION: Negative.   Electronically Signed   By: Anner Crete M.D.   On: 09/03/2015 03:22   Dg Femur, Min 2 Views Right  09/03/2015   CLINICAL DATA:  64 year old male with weakness of the right lower extremity.  EXAM: RIGHT FEMUR 2 VIEWS  COMPARISON:  None.  FINDINGS: No acute fracture or dislocation. There is a focal area osteopenia involving the lateral femoral condyle seen on the lateral projection. The soft tissues are unremarkable.  IMPRESSION: No acute fracture or dislocation.   Electronically Signed   By: Anner Crete M.D.   On: 09/03/2015 03:15    Scheduled Meds: . atorvastatin  40 mg Oral q1800  .  ceFAZolin (ANCEF) IV  2 g Intravenous 3 times per day  . famotidine (PEPCID) IV  20 mg Intravenous Q12H  . folic acid   1 mg Oral Daily  . gabapentin  600 mg Oral TID  . LORazepam  0-4 mg Intravenous Q6H   Followed by  . [START ON 09/05/2015] LORazepam  0-4 mg Intravenous Q12H  . multivitamin with minerals  1 tablet Oral Daily  . sodium chloride  3 mL Intravenous Q12H  . thiamine  100 mg Oral Daily   Or  . thiamine  100 mg Intravenous Daily  . vancomycin  1,250 mg Intravenous Q12H   Continuous Infusions: . sodium chloride 75 mL/hr at 09/03/15 0400    Principal Problem:   UTI (lower urinary tract infection) Active Problems:   ETOH abuse   Neuropathy   Gastric ulcer   Weakness of right leg  Hyponatremia   Thrombocytopenia   Sepsis   Abnormal LFTs   Mediastinal widening   Acute encephalopathy   Syncope   Rhabdomyolysis   Elevated troponin    Time spent: 25 min    [facility] Pager [phone]. If 7PM-7AM, please contact night-coverage at [email or web address], password [facility] 09/04/2015, 2:05 PM  LOS: 1 day

## 2015-09-04 NOTE — Consult Note (Signed)
[facility] for Infectious Disease    Date of Admission:  09/02/2015   Total days of antibiotics 3              Reason for Consult: Automatic consultation for staph aureus bacteremia     Principal Problem:   Staphylococcus aureus bacteremia Active Problems:   ETOH abuse   Neuropathy   Gastric ulcer   Weakness of right leg   Hyponatremia   Thrombocytopenia   Cirrhosis of liver   Splenomegaly   Sepsis   Abnormal LFTs   Mediastinal widening   Acute encephalopathy   Syncope   Rhabdomyolysis   Elevated troponin   Long QT interval   Hyperglycemia   . atorvastatin  40 mg Oral q1800  .  ceFAZolin (ANCEF) IV  2 g Intravenous 3 times per day  . famotidine (PEPCID) IV  20 mg Intravenous Q12H  . folic acid  1 mg Oral Daily  . gabapentin  600 mg Oral TID  . LORazepam  0-4 mg Intravenous Q6H   Followed by  . [START ON 09/05/2015] LORazepam  0-4 mg Intravenous Q12H  . multivitamin with minerals  1 tablet Oral Daily  . sodium chloride  3 mL Intravenous Q12H  . thiamine  100 mg Oral Daily   Or  . thiamine  100 mg Intravenous Daily  . vancomycin  1,250 mg Intravenous Q12H    Recommendations: 1. Continue vancomycin 2. I have changed piperacillin tazobactam to cefazolin  3. Repeat blood cultures 4. Consider TEE next week 5. Discontinue empiric contact precautions  Assessment: He has staph aureus bacteremia. Although his urine culture is also positive for staph aureus I suspect that he may have had a skin source. I will treat him with vancomycin and cefazolin pending antibiotics susceptibility results. This will give him more optimal coverage for both MRSA and MSSA. I will repeat blood cultures today. I would hold off on placing a PICC until we know that blood cultures are negative. I would consider a TEE next week to help gauge duration of therapy.   HPI: Joshua Waller is a 64 y.o. male who was admitted on 09/02/2015 with a history of recent diarrhea, urinary  frequency, syncope, acute on chronic right leg weakness and pain in his thighs. He had low-grade fever to 100.8 on admission and was confused. Both admission blood cultures have grown staph aureus. Urine culture grew staph aureus but his urinalysis was fairly unremarkable. Transthoracic echocardiogram did not reveal any evidence of endocarditis.   Review of Systems: Review of systems not obtained due to patient factors.  Past Medical History  Diagnosis Date  . Lumbar disc disorder   . Anemia   . Neuropathy of lower extremity 2007    very poor balance  . Glaucoma (increased eye pressure)     bilateral  . Gastric ulcer   . Chronic back pain   . Alcohol abuse   . 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 Gastritis   . GI bleeding   . Seizures     unknown type  . Arthritis     back & neck    Social History  Substance Use Topics  . Smoking status: Never Smoker   . Smokeless tobacco: Never Used  . Alcohol Use: 3.6 oz/week    6 Cans of beer per week     Comment: heavy drinker    Family History  Problem Relation Age of Onset  . Heart disease  Mother   . Lumbar disc disease Father    Allergies  Allergen Reactions  . Aspirin Other (See Comments)    Ulcers.    OBJECTIVE: Blood pressure 132/63, pulse 102, temperature 98.5 F (36.9 C), temperature source Oral, resp. rate 29, height 5\' 9"  (1.753 m), weight 213 lb 6.5 oz (96.8 kg), SpO2 94 %. General: He is alert but somewhat confused. He states that he has been here for about 6 days. Skin: Skin tear on right forearm. Abrasions on the left knee. No splinter or conjunctival hemorrhages noted Eyes: Normal external exam Oral: His few remaining teeth are in poor condition Lungs: Clear Cor: Regular S1 and S2 with no murmurs heard Abdomen: Obese, soft and nontender without palpable masses Joints and extremities: He winced in pain when I touched his right hand there is no visible erythema, swelling or palpable warmth  Lab Results Lab Results    Component Value Date   WBC 3.1* 09/04/2015   HGB 13.0 09/04/2015   HCT 36.8* 09/04/2015   MCV 86.4 09/04/2015   PLT 49* 09/04/2015    Lab Results  Component Value Date   CREATININE 0.55* 09/04/2015   BUN 16 09/04/2015   NA 133* 09/04/2015   K 3.3* 09/04/2015   CL 103 09/04/2015   CO2 20* 09/04/2015    Lab Results  Component Value Date   ALT 66* 09/04/2015   AST 101* 09/04/2015   ALKPHOS 26* 09/04/2015   BILITOT 3.8* 09/04/2015     Microbiology: Recent Results (from the past 240 hour(s))  Blood culture (routine x 2)     Status: None (Preliminary result)   Collection Time: 09/03/15 12:11 AM  Result Value Ref Range Status   Specimen Description BLOOD RIGHT HAND  Final   Special Requests BOTTLES DRAWN AEROBIC AND ANAEROBIC 5CC  Final   Culture  Setup Time   Final    GRAM POSITIVE COCCI IN CLUSTERS IN BOTH AEROBIC AND ANAEROBIC BOTTLES CRITICAL RESULT CALLED TO, READ BACK BY AND VERIFIED WITH: S HOFFLER,RN AT 1426 09/03/15 BY L BENFIELD    Culture   Final    STAPHYLOCOCCUS AUREUS Performed at [facility]    Report Status PENDING  Incomplete  Blood culture (routine x 2)     Status: None (Preliminary result)   Collection Time: 09/03/15 12:11 AM  Result Value Ref Range Status   Specimen Description BLOOD LEFT HAND  Final   Special Requests BOTTLES DRAWN AEROBIC AND ANAEROBIC 5CC  Final   Culture  Setup Time   Final    GRAM POSITIVE COCCI IN CLUSTERS IN BOTH AEROBIC AND ANAEROBIC BOTTLES CRITICAL RESULT CALLED TO, READ BACK BY AND VERIFIED WITH: S HOFFLER,RN AT 1426 09/03/15 BY L BENFIELD    Culture   Final    STAPHYLOCOCCUS AUREUS Performed at [facility]    Report Status PENDING  Incomplete  Urine culture     Status: None (Preliminary result)   Collection Time: 09/03/15 12:24 AM  Result Value Ref Range Status   Specimen Description URINE, CATHETERIZED  Final   Special Requests NONE  Final   Culture   Final    >=100,000 COLONIES/mL STAPHYLOCOCCUS  AUREUS Performed at [facility]    Report Status PENDING  Incomplete  MRSA PCR Screening     Status: None   Collection Time: 09/03/15  3:53 AM  Result Value Ref Range Status   MRSA by PCR NEGATIVE NEGATIVE Final    Comment:  The GeneXpert MRSA Assay (FDA approved for NASAL specimens only), is one component of a comprehensive MRSA colonization surveillance program. It is not intended to diagnose MRSA infection nor to guide or monitor treatment for MRSA infections.     Michel Bickers, MD [facility] [place] [facility] [phone] pager   [phone] cell 09/04/2015, 4:21 PM

## 2015-09-05 DIAGNOSIS — E46 Unspecified protein-calorie malnutrition: Secondary | ICD-10-CM

## 2015-09-05 DIAGNOSIS — G9340 Encephalopathy, unspecified: Secondary | ICD-10-CM

## 2015-09-05 DIAGNOSIS — K7460 Unspecified cirrhosis of liver: Secondary | ICD-10-CM

## 2015-09-05 DIAGNOSIS — A4101 Sepsis due to Methicillin susceptible Staphylococcus aureus: Principal | ICD-10-CM

## 2015-09-05 DIAGNOSIS — K7031 Alcoholic cirrhosis of liver with ascites: Secondary | ICD-10-CM | POA: Insufficient documentation

## 2015-09-05 LAB — CBC
HCT: 29.5 % — ABNORMAL LOW (ref 39.0–52.0)
Hemoglobin: 10.2 g/dL — ABNORMAL LOW (ref 13.0–17.0)
MCH: 30.4 pg (ref 26.0–34.0)
MCHC: 34.6 g/dL (ref 30.0–36.0)
MCV: 87.8 fL (ref 78.0–100.0)
PLATELETS: 41 10*3/uL — AB (ref 150–400)
RBC: 3.36 MIL/uL — ABNORMAL LOW (ref 4.22–5.81)
RDW: 16 % — AB (ref 11.5–15.5)
WBC: 1.9 10*3/uL — AB (ref 4.0–10.5)

## 2015-09-05 LAB — COMPREHENSIVE METABOLIC PANEL
ALT: 43 U/L (ref 17–63)
AST: 68 U/L — AB (ref 15–41)
Albumin: 1.5 g/dL — ABNORMAL LOW (ref 3.5–5.0)
Alkaline Phosphatase: 31 U/L — ABNORMAL LOW (ref 38–126)
Anion gap: 6 (ref 5–15)
BILIRUBIN TOTAL: 5.7 mg/dL — AB (ref 0.3–1.2)
BUN: 12 mg/dL (ref 6–20)
CHLORIDE: 113 mmol/L — AB (ref 101–111)
CO2: 18 mmol/L — ABNORMAL LOW (ref 22–32)
Calcium: 6 mg/dL — CL (ref 8.9–10.3)
Glucose, Bld: 122 mg/dL — ABNORMAL HIGH (ref 65–99)
Potassium: 2.3 mmol/L — CL (ref 3.5–5.1)
Sodium: 137 mmol/L (ref 135–145)
Total Protein: 4.3 g/dL — ABNORMAL LOW (ref 6.5–8.1)

## 2015-09-05 LAB — CULTURE, BLOOD (ROUTINE X 2)

## 2015-09-05 LAB — URINE CULTURE: Culture: >=100000

## 2015-09-05 LAB — GLUCOSE, CAPILLARY: Glucose-Capillary: 139 mg/dL — ABNORMAL HIGH (ref 65–99)

## 2015-09-05 MED ORDER — SODIUM CHLORIDE 0.9 % IV SOLN
1.0000 g | Freq: Once | INTRAVENOUS | Status: AC
  Administered 2015-09-05: 1 g via INTRAVENOUS
  Filled 2015-09-05: qty 10

## 2015-09-05 MED ORDER — POTASSIUM CHLORIDE 10 MEQ/100ML IV SOLN
10.0000 meq | INTRAVENOUS | Status: AC
  Administered 2015-09-05 (×6): 10 meq via INTRAVENOUS
  Filled 2015-09-05 (×2): qty 100

## 2015-09-05 NOTE — Progress Notes (Signed)
[facility] PROGRESS NOTE  Joshua Waller VEH:[id number] DOB: Aug 09, 1951 DOA: 09/02/2015 PCP: Leonard Downing, MD  Assessment/Plan: 1. Bacteremia- blood cultures total 2/2 bottles growing gram-positive cocci in clusters. Antibiotic changed to IV Cefazolin per ID. Continue vancomycin per pharmacy consultation 2. UTI- continue cefazolin, vancomycin. Urine culture growing staph aureus. Will follow the final report. 3. Rhabdomyolysis- patient presented with CK of 3381, after IV fluids today CK is 811. Continue  IV fluids. 4. Elevated troponin- patient presented with relative troponin, which has come down to 0.49. Likely from rhabdomyolysis. Cardiology following. 5. Hyponatremia- resolved, today sodium 133. Likely from dehydration. 6. Hypokalemia- will replace potassium, and check BMP in a.m. 7. Transaminitis/liver cirrhosis- secondary to liver cirrhosis, liver enzymes are improving today AST is 68, ALT 43, alkaline phosphatase 26. 8. Encephalopathy- likely multifactorial from alcohol abuse, UTI, rhabdomyolysis. Continue supportive management. Ammonia level 31. 9. Alcohol abuse- continue CIWA protocol 10. 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 11. Leukopenia- WBC is 1.9, likely from liver cirrhosis. Will check cbc in am. 12. Syncope- patient unable to provide any significant history, likely multifactorial. Cardiology following.  Code Status: Full code Family Communication: No family at bedside Disposition Plan: SNF   Consultants:  cardiology   Procedures:  None  Antibiotics:  Vancomycin  Zosyn  Cefazolin  HPI/Subjective: 64 y.o. male with a history of alcohol abuse, Mallory-Weiss tear, seizures, thrombocytopenia, gastric ulcer with GI bleeding who presents with altered bowel complaints. He was slightly confused on arrival and was a difficult historian. He complains of diarrhea for 5 days. He passed out at home on Sunday.  Her EMS report the patient was not ambulating at home well and having multiple falls. In the ER he was found to be febrile with a temp of 101.4, tachycardic and had a lactic acid of 3.6. UA was concerning for UTI. Sodium was 127 and LFTs were elevated. He e was started on vancomycin and Zosyn.  Patient is more alert, though still pleasantly confused. Oriented to self and time, not oriented to place.  Objective: Filed Vitals:   09/05/15 0800  BP: 134/71  Pulse: 89  Temp: 98.5 F (36.9 C)  Resp: 24    Intake/Output Summary (Last 24 hours) at 09/05/15 1106 Last data filed at 09/05/15 0955  Gross per 24 hour  Intake   1675 ml  Output    425 ml  Net   1250 ml   Filed Weights   09/02/15 2155 09/03/15 0336 09/05/15 0400  Weight: 90.719 kg (200 lb) 96.8 kg (213 lb 6.5 oz) 98.7 kg (217 lb 9.5 oz)    Exam:   General:  Confused  Cardiovascular: S1S2 RRR  Respiratory: Clear bilaterally, no wheezing  Abdomen: Soft, nontender, no organomegaly  Musculoskeletal: No edema  Data Reviewed: Basic Metabolic Panel:  Recent Labs Lab 09/03/15 0011 09/03/15 0620 09/04/15 0357 09/05/15 0410  NA 127* 127* 133* 137  K 3.9 3.3* 3.3* 2.3*  CL 93* 97* 103 113*  CO2 21* 19* 20* 18*  GLUCOSE 174* 159* 159* 122*  BUN 21* 17 16 12   CREATININE 0.63 0.55* 0.55* <0.30*  CALCIUM 8.7* 8.2* 8.3* 6.0*   Liver Function Tests:  Recent Labs Lab 09/03/15 0011 09/03/15 0620 09/04/15 0357 09/05/15 0410  AST 206* 157* 101* 68*  ALT 92* 79* 66* 43  ALKPHOS 31* 25* 26* 31*  BILITOT 5.3* 4.8* 3.8* 5.7*  PROT 7.2 6.4* 6.0* 4.3*  ALBUMIN 3.0* 2.5* 2.3* 1.5*   No results for  input(s): LIPASE, AMYLASE in the last 168 hours.  Recent Labs Lab 09/03/15 0620  AMMONIA 26   CBC:  Recent Labs Lab 09/03/15 0011 09/03/15 0620 09/04/15 0357 09/05/15 0410  WBC 5.3 4.4 3.1* 1.9*  NEUTROABS 4.3  --   --   --   HGB 14.8 13.0 13.0 10.2*  HCT 41.8 36.2* 36.8* 29.5*  MCV 85.0 84.6 86.4 87.8  PLT  46* 42* 49* 41*   Cardiac Enzymes:  Recent Labs Lab 09/03/15 0011 09/03/15 0620 09/03/15 1300 09/03/15 1820 09/04/15 0357  CKTOTAL 3381* 2552*  --   --  811*  TROPONINI 1.26* 0.84* 0.59* 0.49*  --    BNP (last 3 results) No results for input(s): BNP in the last 8760 hours.  ProBNP (last 3 results) No results for input(s): PROBNP in the last 8760 hours.  CBG:  Recent Labs Lab 09/03/15 0821 09/04/15 0733 09/05/15 0813  GLUCAP 146* 176* 139*    Recent Results (from the past 240 hour(s))  Blood culture (routine x 2)     Status: None   Collection Time: 09/03/15 12:11 AM  Result Value Ref Range Status   Specimen Description BLOOD RIGHT HAND  Final   Special Requests BOTTLES DRAWN AEROBIC AND ANAEROBIC 5CC  Final   Culture  Setup Time   Final    GRAM POSITIVE COCCI IN CLUSTERS IN BOTH AEROBIC AND ANAEROBIC BOTTLES CRITICAL RESULT CALLED TO, READ BACK BY AND VERIFIED WITH: S HOFFLER,RN AT 1426 09/03/15 BY L BENFIELD    Culture   Final    STAPHYLOCOCCUS AUREUS Performed at [facility]    Report Status 09/05/2015 FINAL  Final   Organism ID, Bacteria STAPHYLOCOCCUS AUREUS  Final      Susceptibility   Staphylococcus aureus - MIC*    CIPROFLOXACIN <=0.5 SENSITIVE Sensitive     ERYTHROMYCIN <=0.25 SENSITIVE Sensitive     GENTAMICIN <=0.5 SENSITIVE Sensitive     OXACILLIN 0.5 SENSITIVE Sensitive     TETRACYCLINE <=1 SENSITIVE Sensitive     VANCOMYCIN 1 SENSITIVE Sensitive     TRIMETH/SULFA <=10 SENSITIVE Sensitive     CLINDAMYCIN <=0.25 SENSITIVE Sensitive     RIFAMPIN <=0.5 SENSITIVE Sensitive     Inducible Clindamycin NEGATIVE Sensitive     * STAPHYLOCOCCUS AUREUS  Blood culture (routine x 2)     Status: None   Collection Time: 09/03/15 12:11 AM  Result Value Ref Range Status   Specimen Description BLOOD LEFT HAND  Final   Special Requests BOTTLES DRAWN AEROBIC AND ANAEROBIC 5CC  Final   Culture  Setup Time   Final    GRAM POSITIVE COCCI IN CLUSTERS IN BOTH  AEROBIC AND ANAEROBIC BOTTLES CRITICAL RESULT CALLED TO, READ BACK BY AND VERIFIED WITH: S HOFFLER,RN AT 1426 09/03/15 BY L BENFIELD    Culture   Final    STAPHYLOCOCCUS AUREUS SUSCEPTIBILITIES PERFORMED ON PREVIOUS CULTURE WITHIN THE LAST 5 DAYS. Performed at [facility]    Report Status 09/05/2015 FINAL  Final  Urine culture     Status: None (Preliminary result)   Collection Time: 09/03/15 12:24 AM  Result Value Ref Range Status   Specimen Description URINE, CATHETERIZED  Final   Special Requests NONE  Final   Culture   Final    >=100,000 COLONIES/mL STAPHYLOCOCCUS AUREUS Performed at [facility]    Report Status PENDING  Incomplete  MRSA PCR Screening     Status: None   Collection Time: 09/03/15  3:53 AM  Result  Value Ref Range Status   MRSA by PCR NEGATIVE NEGATIVE Final    Comment:        The GeneXpert MRSA Assay (FDA approved for NASAL specimens only), is one component of a comprehensive MRSA colonization surveillance program. It is not intended to diagnose MRSA infection nor to guide or monitor treatment for MRSA infections.      Studies: US Abdomen Complete  09/03/2015   CLINICAL DATA:  Abnormal liver enzymes. Alcohol abuse. GI bleeding.  EXAM: ULTRASOUND ABDOMEN COMPLETE  COMPARISON:  Ultrasound dated 09/07/2013  FINDINGS: Gallbladder: No gallstones or wall thickening visualized. No sonographic Murphy sign noted.  Common bile duct: Diameter: 3.6 mm, normal.  Liver: Liver parenchyma is nodular and coarse consistent with cirrhosis.  IVC: No abnormality visualized.  Pancreas: Not visualized.  Spleen: Splenomegaly, unchanged.  Right Kidney: Length: 11.8 cm. Echogenicity within normal limits. No mass or hydronephrosis visualized.  Left Kidney: Length: 13.5 cm. Echogenicity within normal limits. No mass or hydronephrosis visualized.  Abdominal aorta: No aneurysm visualized.  Maximal diameter 2.7 cm.  Other findings: Suggestion of a tiny amount of ascites.   IMPRESSION: Tiny amount of ascites. Cirrhosis. Chronic splenomegaly. Pancreas is obscured by bowel.   Electronically Signed   By: Lorriane Shire M.D.   On: 09/03/2015 16:18    Scheduled Meds: . atorvastatin  40 mg Oral q1800  .  ceFAZolin (ANCEF) IV  2 g Intravenous 3 times per day  . famotidine (PEPCID) IV  20 mg Intravenous Q12H  . folic acid  1 mg Oral Daily  . gabapentin  600 mg Oral TID  . LORazepam  0-4 mg Intravenous Q12H  . multivitamin with minerals  1 tablet Oral Daily  . potassium chloride  10 mEq Intravenous Q1 Hr x 6  . sodium chloride  3 mL Intravenous Q12H  . thiamine  100 mg Oral Daily   Or  . thiamine  100 mg Intravenous Daily  . vancomycin  1,250 mg Intravenous Q12H   Continuous Infusions: . sodium chloride 75 mL/hr at 09/03/15 0400    Principal Problem:   Staphylococcus aureus bacteremia Active Problems:   ETOH abuse   Neuropathy   Gastric ulcer   Weakness of right leg   Hyponatremia   Thrombocytopenia   Cirrhosis of liver   Splenomegaly   Sepsis   Abnormal LFTs   Mediastinal widening   Acute encephalopathy   Syncope   Rhabdomyolysis   Elevated troponin   Long QT interval   Hyperglycemia    Time spent: 25 min    [facility] Pager [phone]. If 7PM-7AM, please contact night-coverage at [email or web address], password [facility] 09/05/2015, 11:06 AM  LOS: 2 days

## 2015-09-05 NOTE — Progress Notes (Signed)
INFECTIOUS DISEASE PROGRESS NOTE  ID: Joshua Waller is a 64 y.o. male with  Principal Problem:   Staphylococcus aureus bacteremia Active Problems:   ETOH abuse   Neuropathy   Gastric ulcer   Weakness of right leg   Hyponatremia   Thrombocytopenia   Cirrhosis of liver   Splenomegaly   Sepsis   Abnormal LFTs   Mediastinal widening   Acute encephalopathy   Syncope   Rhabdomyolysis   Elevated troponin   Long QT interval   Hyperglycemia  Subjective: No complaints  Abtx:  Anti-infectives    Start     Dose/Rate Route Frequency Ordered Stop   09/04/15 1400  ceFAZolin (ANCEF) IVPB 2 g/50 mL premix     2 g 100 mL/hr over 30 Minutes Intravenous 3 times per day 09/04/15 1221     09/03/15 1000  piperacillin-tazobactam (ZOSYN) IVPB 3.375 g  Status:  Discontinued     3.375 g 12.5 mL/hr over 240 Minutes Intravenous Every 8 hours 09/03/15 0128 09/04/15 1221   09/03/15 0200  vancomycin (VANCOCIN) 1,250 mg in sodium chloride 0.9 % 250 mL IVPB     1,250 mg 166.7 mL/hr over 90 Minutes Intravenous Every 12 hours 09/03/15 0114     09/03/15 0045  piperacillin-tazobactam (ZOSYN) IVPB 3.375 g     3.375 g 12.5 mL/hr over 240 Minutes Intravenous  Once 09/03/15 0038 09/03/15 0148      Medications:  Scheduled: . atorvastatin  40 mg Oral q1800  .  ceFAZolin (ANCEF) IV  2 g Intravenous 3 times per day  . famotidine (PEPCID) IV  20 mg Intravenous Q12H  . folic acid  1 mg Oral Daily  . gabapentin  600 mg Oral TID  . LORazepam  0-4 mg Intravenous Q12H  . multivitamin with minerals  1 tablet Oral Daily  . sodium chloride  3 mL Intravenous Q12H  . thiamine  100 mg Oral Daily   Or  . thiamine  100 mg Intravenous Daily  . vancomycin  1,250 mg Intravenous Q12H    Objective: Vital signs in last 24 hours: Temp:  [97.4 F (36.3 C)-99.8 F (37.7 C)] 97.4 F (36.3 C) (09/10 1200) Pulse Rate:  [83-89] 88 (09/10 1200) Resp:  [21-27] 27 (09/10 1200) BP: (133-156)/(58-75) 156/72 mmHg (09/10  1200) SpO2:  [96 %-100 %] 97 % (09/10 1200) Weight:  [98.7 kg (217 lb 9.5 oz)] 98.7 kg (217 lb 9.5 oz) (09/10 0400)   General appearance: cooperative, delirious and no distress Resp: clear to auscultation bilaterally Cardio: regular rate and rhythm GI: normal findings: bowel sounds normal and soft, non-tender and abnormal findings:  distended Extremities: edema 3+ BLE  Lab Results  Recent Labs  09/04/15 0357 09/05/15 0410  WBC 3.1* 1.9*  HGB 13.0 10.2*  HCT 36.8* 29.5*  NA 133* 137  K 3.3* 2.3*  CL 103 113*  CO2 20* 18*  BUN 16 12  CREATININE 0.55* <0.30*   Liver Panel  Recent Labs  09/04/15 0357 09/05/15 0410  PROT 6.0* 4.3*  ALBUMIN 2.3* 1.5*  AST 101* 68*  ALT 66* 43  ALKPHOS 26* 31*  BILITOT 3.8* 5.7*   Sedimentation Rate No results for input(s): ESRSEDRATE in the last 72 hours. C-Reactive Protein No results for input(s): CRP in the last 72 hours.  Microbiology: Recent Results (from the past 240 hour(s))  Blood culture (routine x 2)     Status: None   Collection Time: 09/03/15 12:11 AM  Result Value Ref Range Status  Specimen Description BLOOD RIGHT HAND  Final   Special Requests BOTTLES DRAWN AEROBIC AND ANAEROBIC 5CC  Final   Culture  Setup Time   Final    GRAM POSITIVE COCCI IN CLUSTERS IN BOTH AEROBIC AND ANAEROBIC BOTTLES CRITICAL RESULT CALLED TO, READ BACK BY AND VERIFIED WITH: S HOFFLER,RN AT 1426 09/03/15 BY L BENFIELD    Culture   Final    STAPHYLOCOCCUS AUREUS Performed at [facility]    Report Status 09/05/2015 FINAL  Final   Organism ID, Bacteria STAPHYLOCOCCUS AUREUS  Final      Susceptibility   Staphylococcus aureus - MIC*    CIPROFLOXACIN <=0.5 SENSITIVE Sensitive     ERYTHROMYCIN <=0.25 SENSITIVE Sensitive     GENTAMICIN <=0.5 SENSITIVE Sensitive     OXACILLIN 0.5 SENSITIVE Sensitive     TETRACYCLINE <=1 SENSITIVE Sensitive     VANCOMYCIN 1 SENSITIVE Sensitive     TRIMETH/SULFA <=10 SENSITIVE Sensitive     CLINDAMYCIN  <=0.25 SENSITIVE Sensitive     RIFAMPIN <=0.5 SENSITIVE Sensitive     Inducible Clindamycin NEGATIVE Sensitive     * STAPHYLOCOCCUS AUREUS  Blood culture (routine x 2)     Status: None   Collection Time: 09/03/15 12:11 AM  Result Value Ref Range Status   Specimen Description BLOOD LEFT HAND  Final   Special Requests BOTTLES DRAWN AEROBIC AND ANAEROBIC 5CC  Final   Culture  Setup Time   Final    GRAM POSITIVE COCCI IN CLUSTERS IN BOTH AEROBIC AND ANAEROBIC BOTTLES CRITICAL RESULT CALLED TO, READ BACK BY AND VERIFIED WITH: S HOFFLER,RN AT 1426 09/03/15 BY L BENFIELD    Culture   Final    STAPHYLOCOCCUS AUREUS SUSCEPTIBILITIES PERFORMED ON PREVIOUS CULTURE WITHIN THE LAST 5 DAYS. Performed at [facility]    Report Status 09/05/2015 FINAL  Final  Urine culture     Status: None   Collection Time: 09/03/15 12:24 AM  Result Value Ref Range Status   Specimen Description URINE, CATHETERIZED  Final   Special Requests NONE  Final   Culture   Final    >=100,000 COLONIES/mL STAPHYLOCOCCUS AUREUS Performed at [facility]    Report Status 09/05/2015 FINAL  Final   Organism ID, Bacteria STAPHYLOCOCCUS AUREUS  Final      Susceptibility   Staphylococcus aureus - MIC*    CIPROFLOXACIN <=0.5 SENSITIVE Sensitive     GENTAMICIN <=0.5 SENSITIVE Sensitive     NITROFURANTOIN <=16 SENSITIVE Sensitive     OXACILLIN 0.5 SENSITIVE Sensitive     TETRACYCLINE <=1 SENSITIVE Sensitive     VANCOMYCIN <=0.5 SENSITIVE Sensitive     TRIMETH/SULFA <=10 SENSITIVE Sensitive     CLINDAMYCIN <=0.25 SENSITIVE Sensitive     RIFAMPIN <=0.5 SENSITIVE Sensitive     Inducible Clindamycin NEGATIVE Sensitive     * >=100,000 COLONIES/mL STAPHYLOCOCCUS AUREUS  MRSA PCR Screening     Status: None   Collection Time: 09/03/15  3:53 AM  Result Value Ref Range Status   MRSA by PCR NEGATIVE NEGATIVE Final    Comment:        The GeneXpert MRSA Assay (FDA approved for NASAL specimens only), is one component of  a comprehensive MRSA colonization surveillance program. It is not intended to diagnose MRSA infection nor to guide or monitor treatment for MRSA infections.     Studies/Results: US Abdomen Complete  09/03/2015   CLINICAL DATA:  Abnormal liver enzymes. Alcohol abuse. GI bleeding.  EXAM: ULTRASOUND ABDOMEN COMPLETE  COMPARISON:  Ultrasound dated 09/07/2013  FINDINGS: Gallbladder: No gallstones or wall thickening visualized. No sonographic Murphy sign noted.  Common bile duct: Diameter: 3.6 mm, normal.  Liver: Liver parenchyma is nodular and coarse consistent with cirrhosis.  IVC: No abnormality visualized.  Pancreas: Not visualized.  Spleen: Splenomegaly, unchanged.  Right Kidney: Length: 11.8 cm. Echogenicity within normal limits. No mass or hydronephrosis visualized.  Left Kidney: Length: 13.5 cm. Echogenicity within normal limits. No mass or hydronephrosis visualized.  Abdominal aorta: No aneurysm visualized.  Maximal diameter 2.7 cm.  Other findings: Suggestion of a tiny amount of ascites.  IMPRESSION: Tiny amount of ascites. Cirrhosis. Chronic splenomegaly. Pancreas is obscured by bowel.   Electronically Signed   By: Lorriane Shire M.D.   On: 09/03/2015 16:18     Assessment/Plan: MSSA bacteremia, sepsis  Will stop vanco  Will repeat BCx  Could consider TEE  encephalopathy Cirrhosis,   Follow LFTs, Alb, mental status exam  Suspect this is etiology for his cytopenias  Protein Calorie Malnutrition  Nutritional support  Total days of antibiotics: 4 ancef/vanco         Bobby Rumpf Infectious Diseases (pager) [phone] [email or web address] 09/05/2015, 3:59 PM  LOS: 2 days

## 2015-09-06 LAB — CBC
HEMATOCRIT: 36.2 % — AB (ref 39.0–52.0)
Hemoglobin: 12.4 g/dL — ABNORMAL LOW (ref 13.0–17.0)
MCH: 30.1 pg (ref 26.0–34.0)
MCHC: 34.3 g/dL (ref 30.0–36.0)
MCV: 87.9 fL (ref 78.0–100.0)
PLATELETS: 75 10*3/uL — AB (ref 150–400)
RBC: 4.12 MIL/uL — ABNORMAL LOW (ref 4.22–5.81)
RDW: 16.3 % — AB (ref 11.5–15.5)
WBC: 2.7 10*3/uL — AB (ref 4.0–10.5)

## 2015-09-06 LAB — COMPREHENSIVE METABOLIC PANEL
ALBUMIN: 2.1 g/dL — AB (ref 3.5–5.0)
ALT: 45 U/L (ref 17–63)
ANION GAP: 10 (ref 5–15)
AST: 72 U/L — AB (ref 15–41)
Alkaline Phosphatase: 53 U/L (ref 38–126)
BILIRUBIN TOTAL: 3.8 mg/dL — AB (ref 0.3–1.2)
BUN: 14 mg/dL (ref 6–20)
CHLORIDE: 106 mmol/L (ref 101–111)
CO2: 21 mmol/L — ABNORMAL LOW (ref 22–32)
Calcium: 7.9 mg/dL — ABNORMAL LOW (ref 8.9–10.3)
Creatinine, Ser: 0.39 mg/dL — ABNORMAL LOW (ref 0.61–1.24)
GFR calc Af Amer: >60 mL/min (ref >60)
GFR calc non Af Amer: >60 mL/min (ref >60)
GLUCOSE: 143 mg/dL — AB (ref 65–99)
POTASSIUM: 3.1 mmol/L — AB (ref 3.5–5.1)
SODIUM: 137 mmol/L (ref 135–145)
TOTAL PROTEIN: 6.1 g/dL — AB (ref 6.5–8.1)

## 2015-09-06 MED ORDER — INFLUENZA VAC SPLIT QUAD 0.5 ML IM SUSY
0.5000 mL | PREFILLED_SYRINGE | INTRAMUSCULAR | Status: AC
  Administered 2015-09-10: 0.5 mL via INTRAMUSCULAR
  Filled 2015-09-06 (×3): qty 0.5

## 2015-09-06 MED ORDER — POTASSIUM CHLORIDE CRYS ER 20 MEQ PO TBCR
40.0000 meq | EXTENDED_RELEASE_TABLET | ORAL | Status: AC
  Administered 2015-09-06 (×3): 40 meq via ORAL
  Filled 2015-09-06 (×3): qty 2

## 2015-09-06 MED ORDER — PNEUMOCOCCAL VAC POLYVALENT 25 MCG/0.5ML IJ INJ
0.5000 mL | INJECTION | INTRAMUSCULAR | Status: DC
Start: 2015-09-07 — End: 2015-09-10
  Filled 2015-09-06 (×3): qty 0.5

## 2015-09-06 NOTE — Progress Notes (Signed)
Pt arrived from ICU on ICU bed. Slid to floor bed w/ 3 assist. Pt drowsy, awakens and mumbles during care. Oriented to person and time, states he is in his recliner at home. Continuously asking for a "cerveza".  VSS. Pt oriented to callbell and environment. Reoriented to place. Falls asleep while speaking. Bed alarm on. Will monitor.
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
## 2015-09-07 ENCOUNTER — Inpatient Hospital Stay (HOSPITAL_COMMUNITY): Payer: Medicare Other

## 2015-09-07 DIAGNOSIS — M7989 Other specified soft tissue disorders: Secondary | ICD-10-CM

## 2015-09-07 DIAGNOSIS — I82409 Acute embolism and thrombosis of unspecified deep veins of unspecified lower extremity: Secondary | ICD-10-CM

## 2015-09-07 LAB — COMPREHENSIVE METABOLIC PANEL
ALT: 40 U/L (ref 17–63)
ANION GAP: 6 (ref 5–15)
AST: 98 U/L — ABNORMAL HIGH (ref 15–41)
Albumin: 2 g/dL — ABNORMAL LOW (ref 3.5–5.0)
Alkaline Phosphatase: 70 U/L (ref 38–126)
BUN: 10 mg/dL (ref 6–20)
CALCIUM: 7.6 mg/dL — AB (ref 8.9–10.3)
CHLORIDE: 105 mmol/L (ref 101–111)
CO2: 22 mmol/L (ref 22–32)
Creatinine, Ser: 0.39 mg/dL — ABNORMAL LOW (ref 0.61–1.24)
GFR calc non Af Amer: >60 mL/min (ref >60)
Glucose, Bld: 190 mg/dL — ABNORMAL HIGH (ref 65–99)
POTASSIUM: 3.2 mmol/L — AB (ref 3.5–5.1)
SODIUM: 133 mmol/L — AB (ref 135–145)
Total Bilirubin: 3.4 mg/dL — ABNORMAL HIGH (ref 0.3–1.2)
Total Protein: 6.2 g/dL — ABNORMAL LOW (ref 6.5–8.1)

## 2015-09-07 LAB — CBC
HCT: 35.6 % — ABNORMAL LOW (ref 39.0–52.0)
Hemoglobin: 12.1 g/dL — ABNORMAL LOW (ref 13.0–17.0)
MCH: 30.3 pg (ref 26.0–34.0)
MCHC: 34 g/dL (ref 30.0–36.0)
MCV: 89.2 fL (ref 78.0–100.0)
PLATELETS: 98 10*3/uL — AB (ref 150–400)
RBC: 3.99 MIL/uL — ABNORMAL LOW (ref 4.22–5.81)
RDW: 16.4 % — AB (ref 11.5–15.5)
WBC: 3.5 10*3/uL — ABNORMAL LOW (ref 4.0–10.5)

## 2015-09-07 LAB — GLUCOSE, CAPILLARY: GLUCOSE-CAPILLARY: 162 mg/dL — AB (ref 65–99)

## 2015-09-07 LAB — HEPARIN LEVEL (UNFRACTIONATED): Heparin Unfractionated: <0.1 IU/mL — ABNORMAL LOW (ref 0.30–0.70)

## 2015-09-07 MED ORDER — HEPARIN (PORCINE) IN NACL 100-0.45 UNIT/ML-% IJ SOLN
1150.0000 [IU]/h | INTRAMUSCULAR | Status: DC
  Administered 2015-09-07: 1150 [IU]/h via INTRAVENOUS
  Filled 2015-09-07: qty 250

## 2015-09-07 MED ORDER — HEPARIN (PORCINE) IN NACL 100-0.45 UNIT/ML-% IJ SOLN
1500.0000 [IU]/h | INTRAMUSCULAR | Status: DC
  Administered 2015-09-08: 1500 [IU]/h via INTRAVENOUS
  Filled 2015-09-07: qty 250

## 2015-09-07 MED ORDER — HEPARIN BOLUS VIA INFUSION
2500.0000 [IU] | Freq: Once | INTRAVENOUS | Status: AC
  Administered 2015-09-07: 2500 [IU] via INTRAVENOUS
  Filled 2015-09-07: qty 2500

## 2015-09-07 MED ORDER — HEPARIN BOLUS VIA INFUSION
2000.0000 [IU] | Freq: Once | INTRAVENOUS | Status: AC
  Administered 2015-09-07: 2000 [IU] via INTRAVENOUS
  Filled 2015-09-07: qty 2000

## 2015-09-07 MED ORDER — OXYCODONE HCL 5 MG PO TABS
5.0000 mg | ORAL_TABLET | Freq: Four times a day (QID) | ORAL | Status: DC | PRN
  Administered 2015-09-07 – 2015-09-08 (×2): 5 mg via ORAL
  Filled 2015-09-07 (×2): qty 1

## 2015-09-07 NOTE — Progress Notes (Signed)
ANTICOAGULATION CONSULT NOTE - Follow Up  Pharmacy Consult for Heparin IV Indication: DVT  Please see previous progress note from Reuel Boom, PharmD for full details.  Heparin level < 0.10 (undetectable) Confirmed with RN that heparin drip infusing at correct rate of 1150 units/hr (11.5 ml/hr) with no IV issues noted. No bleeding complications reported.  Plan:  Rebolus heparin 2500 units IV x 1 now  Increase heparin infusion rate to 1500 units/hr  Recheck heparin level in 6 hours  Peggyann Juba, PharmD, BCPS Pager: [phone] 09/07/2015 6:53 PM

## 2015-09-07 NOTE — Progress Notes (Signed)
[facility] PROGRESS NOTE  Lincoln Ginley PJA:[id number] DOB: 1951/06/04 DOA: 09/02/2015 PCP: Leonard Downing, MD  Assessment/Plan: 1. Bacteremia- blood cultures total 2/2 bottles growing gram-positive cocci in clusters. Antibiotic changed to IV Cefazolin per ID.  2. RLE DVT- Patient has RLE DVT, noted in the right femoral vein, right popliteal vein, right posterior tibial veins, and right peroneal veins. Will start heparin per pharmacy consultation, today the platelet count has come up to 98. He was not given Lovenox for DVT prophylaxis due to thrombocytopenia as platelet count was less than 100. 3. UTI- continue cefazolin, vancomycin. Urine culture growing staph aureus. Will follow the final report. 4. Rhabdomyolysis- patient presented with CK of 3381, after IV fluids today CK is 811. Continue  IV fluids. 5. Elevated troponin- patient presented with relative troponin, which has come down to 0.49. Likely from rhabdomyolysis. Cardiology following. 6. Hyponatremia- resolved. Likely from dehydration. 7. Hypokalemia- will replace potassium, and check BMP in a.m. 8. Transaminitis/liver cirrhosis- secondary to liver cirrhosis, liver enzymes are improving today AST is 68, ALT 43, alkaline phosphatase 26. 9. Encephalopathy- likely multifactorial from alcohol abuse, UTI, rhabdomyolysis. Continue supportive management. Ammonia level 31.CIWA protocol. 10. Alcohol abuse- continue CIWA protocol 11. 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 12. Leukopenia- resolved,  WBC is 3.5, likely from liver cirrhosis.  13. Syncope- likely multifactorial. Cardiology following. 14. DVT prophylaxis- SCD's. He was not given Lovenox due to thrombocytopenia.  Code Status: Full code Family Communication: No family at bedside Disposition Plan: SNF   Consultants:  cardiology    Procedures:  None  Antibiotics:  Vancomycin  Zosyn  Cefazolin  HPI/Subjective: 64 y.o. male with a history of alcohol abuse, Mallory-Weiss tear, seizures, thrombocytopenia, gastric ulcer with GI bleeding who presents with altered bowel complaints. He was slightly confused on arrival and was a difficult historian. He complains of diarrhea for 5 days. He passed out at home on Sunday. Her EMS report the patient was not ambulating at home well and having multiple falls. In the ER he was found to be febrile with a temp of 101.4, tachycardic and had a lactic acid of 3.6. UA was concerning for UTI. Sodium was 127 and LFTs were elevated. He e was started on vancomycin and Zosyn.  Patient more alert, oriented x 3. Complained of right leg pain and swelling this morning.  Objective: Filed Vitals:   09/07/15 0658  BP: 132/63  Pulse: 88  Temp: 97.8 F (36.6 C)  Resp: 18    Intake/Output Summary (Last 24 hours) at 09/07/15 1125 Last data filed at 09/07/15 1119  Gross per 24 hour  Intake   3165 ml  Output    700 ml  Net   2465 ml   Filed Weights   09/02/15 2155 09/03/15 0336 09/05/15 0400  Weight: 90.719 kg (200 lb) 96.8 kg (213 lb 6.5 oz) 98.7 kg (217 lb 9.5 oz)    Exam:   General:  Confused  Cardiovascular: S1S2 RRR  Respiratory: Clear bilaterally, no wheezing  Abdomen: Soft, nontender, no organomegaly  Musculoskeletal: RLE  Edematous,tender to palpation, homans sign positive  Data Reviewed: Basic Metabolic Panel:  Recent Labs Lab 09/03/15 0011 09/03/15 0620 09/04/15 0357 09/05/15 0410 09/06/15 0435  NA 127* 127* 133* 137 137  K 3.9 3.3* 3.3* 2.3* 3.1*  CL 93* 97* 103 113* 106  CO2 21* 19* 20* 18* 21*  GLUCOSE 174* 159* 159* 122* 143*  BUN 21* 17 16 12 14   CREATININE 0.63  0.55* 0.55* <0.30* 0.39*  CALCIUM 8.7* 8.2* 8.3* 6.0* 7.9*   Liver Function Tests:  Recent Labs Lab 09/03/15 0011 09/03/15 0620 09/04/15 0357 09/05/15 0410 09/06/15 0435  AST  206* 157* 101* 68* 72*  ALT 92* 79* 66* 43 45  ALKPHOS 31* 25* 26* 31* 53  BILITOT 5.3* 4.8* 3.8* 5.7* 3.8*  PROT 7.2 6.4* 6.0* 4.3* 6.1*  ALBUMIN 3.0* 2.5* 2.3* 1.5* 2.1*   No results for input(s): LIPASE, AMYLASE in the last 168 hours.  Recent Labs Lab 09/03/15 0620  AMMONIA 26   CBC:  Recent Labs Lab 09/03/15 0011 09/03/15 0620 09/04/15 0357 09/05/15 0410 09/06/15 0435 09/07/15 1020  WBC 5.3 4.4 3.1* 1.9* 2.7* 3.5*  NEUTROABS 4.3  --   --   --   --   --   HGB 14.8 13.0 13.0 10.2* 12.4* 12.1*  HCT 41.8 36.2* 36.8* 29.5* 36.2* 35.6*  MCV 85.0 84.6 86.4 87.8 87.9 89.2  PLT 46* 42* 49* 41* 75* 98*   Cardiac Enzymes:  Recent Labs Lab 09/03/15 0011 09/03/15 0620 09/03/15 1300 09/03/15 1820 09/04/15 0357  CKTOTAL 3381* 2552*  --   --  811*  TROPONINI 1.26* 0.84* 0.59* 0.49*  --    BNP (last 3 results) No results for input(s): BNP in the last 8760 hours.  ProBNP (last 3 results) No results for input(s): PROBNP in the last 8760 hours.  CBG:  Recent Labs Lab 09/03/15 0821 09/04/15 0733 09/05/15 0813 09/07/15 0729  GLUCAP 146* 176* 139* 162*    Recent Results (from the past 240 hour(s))  Blood culture (routine x 2)     Status: None   Collection Time: 09/03/15 12:11 AM  Result Value Ref Range Status   Specimen Description BLOOD RIGHT HAND  Final   Special Requests BOTTLES DRAWN AEROBIC AND ANAEROBIC 5CC  Final   Culture  Setup Time   Final    GRAM POSITIVE COCCI IN CLUSTERS IN BOTH AEROBIC AND ANAEROBIC BOTTLES CRITICAL RESULT CALLED TO, READ BACK BY AND VERIFIED WITH: S HOFFLER,RN AT 1426 09/03/15 BY L BENFIELD    Culture   Final    STAPHYLOCOCCUS AUREUS Performed at [facility]    Report Status 09/05/2015 FINAL  Final   Organism ID, Bacteria STAPHYLOCOCCUS AUREUS  Final      Susceptibility   Staphylococcus aureus - MIC*    CIPROFLOXACIN <=0.5 SENSITIVE Sensitive     ERYTHROMYCIN <=0.25 SENSITIVE Sensitive     GENTAMICIN <=0.5 SENSITIVE  Sensitive     OXACILLIN 0.5 SENSITIVE Sensitive     TETRACYCLINE <=1 SENSITIVE Sensitive     VANCOMYCIN 1 SENSITIVE Sensitive     TRIMETH/SULFA <=10 SENSITIVE Sensitive     CLINDAMYCIN <=0.25 SENSITIVE Sensitive     RIFAMPIN <=0.5 SENSITIVE Sensitive     Inducible Clindamycin NEGATIVE Sensitive     * STAPHYLOCOCCUS AUREUS  Blood culture (routine x 2)     Status: None   Collection Time: 09/03/15 12:11 AM  Result Value Ref Range Status   Specimen Description BLOOD LEFT HAND  Final   Special Requests BOTTLES DRAWN AEROBIC AND ANAEROBIC 5CC  Final   Culture  Setup Time   Final    GRAM POSITIVE COCCI IN CLUSTERS IN BOTH AEROBIC AND ANAEROBIC BOTTLES CRITICAL RESULT CALLED TO, READ BACK BY AND VERIFIED WITH: S HOFFLER,RN AT 1426 09/03/15 BY L BENFIELD    Culture   Final    STAPHYLOCOCCUS AUREUS SUSCEPTIBILITIES PERFORMED ON PREVIOUS CULTURE WITHIN THE LAST 5  DAYS. Performed at [facility]    Report Status 09/05/2015 FINAL  Final  Urine culture     Status: None   Collection Time: 09/03/15 12:24 AM  Result Value Ref Range Status   Specimen Description URINE, CATHETERIZED  Final   Special Requests NONE  Final   Culture   Final    >=100,000 COLONIES/mL STAPHYLOCOCCUS AUREUS Performed at [facility]    Report Status 09/05/2015 FINAL  Final   Organism ID, Bacteria STAPHYLOCOCCUS AUREUS  Final      Susceptibility   Staphylococcus aureus - MIC*    CIPROFLOXACIN <=0.5 SENSITIVE Sensitive     GENTAMICIN <=0.5 SENSITIVE Sensitive     NITROFURANTOIN <=16 SENSITIVE Sensitive     OXACILLIN 0.5 SENSITIVE Sensitive     TETRACYCLINE <=1 SENSITIVE Sensitive     VANCOMYCIN <=0.5 SENSITIVE Sensitive     TRIMETH/SULFA <=10 SENSITIVE Sensitive     CLINDAMYCIN <=0.25 SENSITIVE Sensitive     RIFAMPIN <=0.5 SENSITIVE Sensitive     Inducible Clindamycin NEGATIVE Sensitive     * >=100,000 COLONIES/mL STAPHYLOCOCCUS AUREUS  MRSA PCR Screening     Status: None   Collection Time: 09/03/15   3:53 AM  Result Value Ref Range Status   MRSA by PCR NEGATIVE NEGATIVE Final    Comment:        The GeneXpert MRSA Assay (FDA approved for NASAL specimens only), is one component of a comprehensive MRSA colonization surveillance program. It is not intended to diagnose MRSA infection nor to guide or monitor treatment for MRSA infections.   Culture, blood (routine x 2)     Status: None (Preliminary result)   Collection Time: 09/05/15  6:25 PM  Result Value Ref Range Status   Specimen Description BLOOD RIGHT HAND  10 ML IN AEROBIC ONLY  Final   Special Requests Immunocompromised  Final   Culture   Final    NO GROWTH < 24 HOURS Performed at [facility]    Report Status PENDING  Incomplete     Studies: No results found.  Scheduled Meds: . atorvastatin  40 mg Oral q1800  .  ceFAZolin (ANCEF) IV  2 g Intravenous 3 times per day  . famotidine (PEPCID) IV  20 mg Intravenous Q12H  . folic acid  1 mg Oral Daily  . gabapentin  600 mg Oral TID  . Influenza vac split quadrivalent PF  0.5 mL Intramuscular Tomorrow-1000  . multivitamin with minerals  1 tablet Oral Daily  . pneumococcal 23 valent vaccine  0.5 mL Intramuscular Tomorrow-1000  . sodium chloride  3 mL Intravenous Q12H  . thiamine  100 mg Oral Daily   Or  . thiamine  100 mg Intravenous Daily   Continuous Infusions: . sodium chloride 75 mL/hr at 09/06/15 2229    Principal Problem:   Staphylococcus aureus bacteremia Active Problems:   ETOH abuse   Neuropathy   Gastric ulcer   Weakness of right leg   Hyponatremia   Thrombocytopenia   Cirrhosis of liver   Splenomegaly   Sepsis   Abnormal LFTs   Mediastinal widening   Acute encephalopathy   Syncope   Rhabdomyolysis   Elevated troponin   Long QT interval   Hyperglycemia   Alcoholic cirrhosis of liver without ascites    Time spent: 25 min    [facility] Pager [phone]. If 7PM-7AM, please contact night-coverage at  [email or web address], password [facility] 09/07/2015, 11:25 AM  LOS: 4 days

## 2015-09-07 NOTE — Progress Notes (Signed)
*  PRELIMINARY RESULTS* Vascular Ultrasound Lower extremity venous duplex has been completed.  Preliminary findings: DVT noted in the right femoral vein, right popliteal vein, right posterior tibial veins, and right peroneal veins. No DVT LLE.   Landry Mellow, RDMS, RVT  09/07/2015, 11:20 AM

## 2015-09-07 NOTE — Progress Notes (Signed)
Patient had 2 watery loose stools overnight.  Will continue to monitor patient. Joshua Waller, Joshua Waller

## 2015-09-07 NOTE — Progress Notes (Signed)
CSW continuing to follow.   CSW re-attempted this afternoon to meet with pt regarding recommendation for SNF.   Pt sleeping soundly upon CSW entering the room. Pt awoke briefly, but soon went back to sleep and was unable to participate in assessment.  CSW spoke with RN who reports that pt was awake and alert and oriented this morning and likely tired at this time secondary to pain medication.   CSW to attempt to meet with pt tomorrow morning to discuss SNF.   Alison Murray, MSW, [place] [facility] [phone]

## 2015-09-07 NOTE — Care Management Important Message (Signed)
Important Message  Patient Details  Name: Joshua Waller MRN: [id number] Date of Birth: May 27, 1951   Medicare Important Message Given:  Yes-second notification given    Camillo Flaming 09/07/2015, 12:24 [place] Message  Patient Details  Name: Joshua Waller MRN: [id number] Date of Birth: 11-Apr-1951   Medicare Important Message Given:  Yes-second notification given    Camillo Flaming 09/07/2015, 12:24 PM

## 2015-09-07 NOTE — Progress Notes (Signed)
ANTICOAGULATION CONSULT NOTE - Initial Consult  Pharmacy Consult for Heparin IV Indication: DVT  Allergies  Allergen Reactions  . Aspirin Other (See Comments)    Ulcers.    Patient Measurements: Height: 5\' 9"  (175.3 cm) Weight: 217 lb 9.5 oz (98.7 kg) IBW/kg (Calculated) : 70.7 Heparin Dosing Weight: 91 kg  Vital Signs: Temp: 97.8 F (36.6 C) (09/12 0658) Temp Source: Oral (09/12 0658) BP: 132/63 mmHg (09/12 0658) Pulse Rate: 88 (09/12 0658)  Labs:  Recent Labs  09/05/15 0410 09/06/15 0435 09/07/15 1020  HGB 10.2* 12.4* 12.1*  HCT 29.5* 36.2* 35.6*  PLT 41* 75* 98*  CREATININE <0.30* 0.39* 0.39*    Estimated Creatinine Clearance: 106.6 mL/min (by C-G formula based on Cr of 0.39).   Medical History: Past Medical History  Diagnosis Date  . Lumbar disc disorder   . Anemia   . Neuropathy of lower extremity 2007    very poor balance  . Glaucoma (increased eye pressure)     bilateral  . Gastric ulcer   . Chronic back pain   . Alcohol abuse   . 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 Gastritis   . GI bleeding   . Seizures     unknown type  . Arthritis     back & neck    Medications:  Scheduled:  . atorvastatin  40 mg Oral q1800  .  ceFAZolin (ANCEF) IV  2 g Intravenous 3 times per day  . famotidine (PEPCID) IV  20 mg Intravenous Q12H  . folic acid  1 mg Oral Daily  . gabapentin  600 mg Oral TID  . heparin  2,000 Units Intravenous Once  . Influenza vac split quadrivalent PF  0.5 mL Intramuscular Tomorrow-1000  . multivitamin with minerals  1 tablet Oral Daily  . pneumococcal 23 valent vaccine  0.5 mL Intramuscular Tomorrow-1000  . sodium chloride  3 mL Intravenous Q12H  . thiamine  100 mg Oral Daily   Or  . thiamine  100 mg Intravenous Daily   Infusions:  . sodium chloride 75 mL/hr at 09/06/15 2229  . heparin      Assessment: 43 yoM w/ PMH cirrhosis and thrombocytopenia d/t EtOH abuse, gastric ulcer w/ GI bleeding, presented 9/8 with syncope, polyuria,  diarrhea, R leg weakness, AMS.  Found to have bacteremia, UTI, rhabodmyolysis, for which he is currently being treated.  Patient did not receive pharmacological VTE ppx d/t low platelet count.  On 9/12, venous duplex shows RLE DVT.  MD ok to proceed with IV UFH per pharmacy as platelets have significantly improved   Baseline INR, aPTT: wnl  Prior anticoagulation: none  Significant events:  Today, 09/07/2015:  CBC: Hgb low but stable/improved.  Plt 42 on admit, now 98  No bleeding or infusion issues per nursing  CrCl: > 90 ml/min  Goal of Therapy: Heparin level 0.3-0.7 units/ml Monitor platelets by anticoagulation protocol: Yes  Plan: (per [place] nomogram)  Heparin 2000 units IV bolus x 1  Heparin 1150 units/hr IV infusion  Check heparin level 6 hrs after start  Daily CBC, daily heparin level once stable  Monitor for signs of bleeding or thrombosis.  Discussed low threshold for GIB workup, given cirrhosis and Hx GIB   Reuel Boom, PharmD Pager: [phone] 09/07/2015, 11:44 AM

## 2015-09-07 NOTE — Progress Notes (Signed)
Patient ID: Joshua Waller, male   DOB: 12-07-1951, 64 y.o.   MRN: [id number]         [facility]    Date of Admission:  09/02/2015    Total days of antibiotics 6         Principal Problem:   Staphylococcus aureus bacteremia Active Problems:   ETOH abuse   Neuropathy   Gastric ulcer   Weakness of right leg   Hyponatremia   Thrombocytopenia   Cirrhosis of liver   Splenomegaly   Sepsis   Abnormal LFTs   Mediastinal widening   Acute encephalopathy   Syncope   Rhabdomyolysis   Elevated troponin   Long QT interval   Hyperglycemia   Alcoholic cirrhosis of liver without ascites   DVT (deep venous thrombosis)   . atorvastatin  40 mg Oral q1800  .  ceFAZolin (ANCEF) IV  2 g Intravenous 3 times per day  . famotidine (PEPCID) IV  20 mg Intravenous Q12H  . folic acid  1 mg Oral Daily  . gabapentin  600 mg Oral TID  . Influenza vac split quadrivalent PF  0.5 mL Intramuscular Tomorrow-1000  . multivitamin with minerals  1 tablet Oral Daily  . pneumococcal 23 valent vaccine  0.5 mL Intramuscular Tomorrow-1000  . sodium chloride  3 mL Intravenous Q12H  . thiamine  100 mg Oral Daily   Or  . thiamine  100 mg Intravenous Daily    SUBJECTIVE: He mumbles that he is doing better.  Review of Systems: Review of systems not obtained due to patient factors.  Past Medical History  Diagnosis Date  . Lumbar disc disorder   . Anemia   . Neuropathy of lower extremity 2007    very poor balance  . Glaucoma (increased eye pressure)     bilateral  . Gastric ulcer   . Chronic back pain   . Alcohol abuse   . 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 Gastritis   . GI bleeding   . Seizures     unknown type  . Arthritis     back & neck    Social History  Substance Use Topics  . Smoking status: Never Smoker   . Smokeless tobacco: Never Used  . Alcohol Use: 3.6 oz/week    6 Cans of beer per week     Comment: heavy drinker    Family History  Problem Relation Age of  Onset  . Heart disease Mother   . Lumbar disc disease Father    Allergies  Allergen Reactions  . Aspirin Other (See Comments)    Ulcers.    OBJECTIVE: Filed Vitals:   09/06/15 1300 09/06/15 2200 09/07/15 0658 09/07/15 1328  BP: 126/62 148/70 132/63 130/64  Pulse: 95 88 88 83  Temp: 98.2 F (36.8 C) 97.9 F (36.6 C) 97.8 F (36.6 C) 97.8 F (36.6 C)  TempSrc: Axillary Oral Oral Oral  Resp: 20 20 18 16   Height:      Weight:      SpO2: 96% 92% 94% 93%   Body mass index is 32.12 kg/(m^2).  General: He is asleep but arouses to voice and tries to answer questions. His responses are difficult to understand Skin: No rash Lungs: Clear Cor: Regular S1 and S2 with a 2/6 early systolic murmur Abdomen: Soft and not obviously tender Joints and extremities: No acute abnormalities  Lab Results Lab Results  Component Value Date   WBC 3.5* 09/07/2015   HGB 12.1* 09/07/2015  HCT 35.6* 09/07/2015   MCV 89.2 09/07/2015   PLT 98* 09/07/2015    Lab Results  Component Value Date   CREATININE 0.39* 09/07/2015   BUN 10 09/07/2015   NA 133* 09/07/2015   K 3.2* 09/07/2015   CL 105 09/07/2015   CO2 22 09/07/2015    Lab Results  Component Value Date   ALT 40 09/07/2015   AST 98* 09/07/2015   ALKPHOS 70 09/07/2015   BILITOT 3.4* 09/07/2015     Microbiology: Recent Results (from the past 240 hour(s))  Blood culture (routine x 2)     Status: None   Collection Time: 09/03/15 12:11 AM  Result Value Ref Range Status   Specimen Description BLOOD RIGHT HAND  Final   Special Requests BOTTLES DRAWN AEROBIC AND ANAEROBIC 5CC  Final   Culture  Setup Time   Final    GRAM POSITIVE COCCI IN CLUSTERS IN BOTH AEROBIC AND ANAEROBIC BOTTLES CRITICAL RESULT CALLED TO, READ BACK BY AND VERIFIED WITH: S HOFFLER,RN AT 1426 09/03/15 BY L BENFIELD    Culture   Final    STAPHYLOCOCCUS AUREUS Performed at [facility]    Report Status 09/05/2015 FINAL  Final   Organism ID, Bacteria  STAPHYLOCOCCUS AUREUS  Final      Susceptibility   Staphylococcus aureus - MIC*    CIPROFLOXACIN <=0.5 SENSITIVE Sensitive     ERYTHROMYCIN <=0.25 SENSITIVE Sensitive     GENTAMICIN <=0.5 SENSITIVE Sensitive     OXACILLIN 0.5 SENSITIVE Sensitive     TETRACYCLINE <=1 SENSITIVE Sensitive     VANCOMYCIN 1 SENSITIVE Sensitive     TRIMETH/SULFA <=10 SENSITIVE Sensitive     CLINDAMYCIN <=0.25 SENSITIVE Sensitive     RIFAMPIN <=0.5 SENSITIVE Sensitive     Inducible Clindamycin NEGATIVE Sensitive     * STAPHYLOCOCCUS AUREUS  Blood culture (routine x 2)     Status: None   Collection Time: 09/03/15 12:11 AM  Result Value Ref Range Status   Specimen Description BLOOD LEFT HAND  Final   Special Requests BOTTLES DRAWN AEROBIC AND ANAEROBIC 5CC  Final   Culture  Setup Time   Final    GRAM POSITIVE COCCI IN CLUSTERS IN BOTH AEROBIC AND ANAEROBIC BOTTLES CRITICAL RESULT CALLED TO, READ BACK BY AND VERIFIED WITH: S HOFFLER,RN AT 1426 09/03/15 BY L BENFIELD    Culture   Final    STAPHYLOCOCCUS AUREUS SUSCEPTIBILITIES PERFORMED ON PREVIOUS CULTURE WITHIN THE LAST 5 DAYS. Performed at [facility]    Report Status 09/05/2015 FINAL  Final  Urine culture     Status: None   Collection Time: 09/03/15 12:24 AM  Result Value Ref Range Status   Specimen Description URINE, CATHETERIZED  Final   Special Requests NONE  Final   Culture   Final    >=100,000 COLONIES/mL STAPHYLOCOCCUS AUREUS Performed at [facility]    Report Status 09/05/2015 FINAL  Final   Organism ID, Bacteria STAPHYLOCOCCUS AUREUS  Final      Susceptibility   Staphylococcus aureus - MIC*    CIPROFLOXACIN <=0.5 SENSITIVE Sensitive     GENTAMICIN <=0.5 SENSITIVE Sensitive     NITROFURANTOIN <=16 SENSITIVE Sensitive     OXACILLIN 0.5 SENSITIVE Sensitive     TETRACYCLINE <=1 SENSITIVE Sensitive     VANCOMYCIN <=0.5 SENSITIVE Sensitive     TRIMETH/SULFA <=10 SENSITIVE Sensitive     CLINDAMYCIN <=0.25 SENSITIVE Sensitive      RIFAMPIN <=0.5 SENSITIVE Sensitive     Inducible Clindamycin  NEGATIVE Sensitive     * >=100,000 COLONIES/mL STAPHYLOCOCCUS AUREUS  MRSA PCR Screening     Status: None   Collection Time: 09/03/15  3:53 AM  Result Value Ref Range Status   MRSA by PCR NEGATIVE NEGATIVE Final    Comment:        The GeneXpert MRSA Assay (FDA approved for NASAL specimens only), is one component of a comprehensive MRSA colonization surveillance program. It is not intended to diagnose MRSA infection nor to guide or monitor treatment for MRSA infections.   Culture, blood (routine x 2)     Status: None (Preliminary result)   Collection Time: 09/05/15  6:25 PM  Result Value Ref Range Status   Specimen Description BLOOD RIGHT HAND  10 ML IN AEROBIC ONLY  Final   Special Requests Immunocompromised  Final   Culture   Final    NO GROWTH 2 DAYS Performed at [facility]    Report Status PENDING  Incomplete     ASSESSMENT: He has MSSA bacteremia. So far repeat blood cultures are negative at 48 hours. If they remain negative tomorrow which should be okay to have a PICC placed. There was no evidence of endocarditis by transthoracic echocardiogram but I feel he should have a TEE.  PLAN: 1. Continue cefazolin 2. Okay to place PICC tomorrow if blood cultures remain negative 3. Recommend TEE  Michel Bickers, MD [facility] [place] [facility] [phone] pager   [phone] cell 09/07/2015, 4:16 PM

## 2015-09-07 NOTE — Progress Notes (Signed)
PT Cancellation Note  Patient Details Name: Joshua Waller MRN: [id number] DOB: 02-26-1951   Cancelled Treatment:     positive DVT and pt on Hep Drip.  Will check back tomorrow.  Pt has been evaluated by LPT and plans are for SNF.   [name] 09/07/2015, 1:23 PM

## 2015-09-08 LAB — COMPREHENSIVE METABOLIC PANEL
ALBUMIN: 1.9 g/dL — AB (ref 3.5–5.0)
ALT: 27 U/L (ref 17–63)
ANION GAP: 7 (ref 5–15)
AST: 69 U/L — AB (ref 15–41)
Alkaline Phosphatase: 57 U/L (ref 38–126)
BUN: 8 mg/dL (ref 6–20)
CHLORIDE: 102 mmol/L (ref 101–111)
CO2: 22 mmol/L (ref 22–32)
Calcium: 7.5 mg/dL — ABNORMAL LOW (ref 8.9–10.3)
Creatinine, Ser: 0.41 mg/dL — ABNORMAL LOW (ref 0.61–1.24)
GFR calc Af Amer: >60 mL/min (ref >60)
GFR calc non Af Amer: >60 mL/min (ref >60)
GLUCOSE: 192 mg/dL — AB (ref 65–99)
POTASSIUM: 3 mmol/L — AB (ref 3.5–5.1)
SODIUM: 131 mmol/L — AB (ref 135–145)
TOTAL PROTEIN: 6.1 g/dL — AB (ref 6.5–8.1)
Total Bilirubin: 3.3 mg/dL — ABNORMAL HIGH (ref 0.3–1.2)

## 2015-09-08 LAB — HEPARIN LEVEL (UNFRACTIONATED)
HEPARIN UNFRACTIONATED: 0.13 [IU]/mL — AB (ref 0.30–0.70)
Heparin Unfractionated: <0.1 IU/mL — ABNORMAL LOW (ref 0.30–0.70)

## 2015-09-08 LAB — GLUCOSE, CAPILLARY: GLUCOSE-CAPILLARY: 141 mg/dL — AB (ref 65–99)

## 2015-09-08 LAB — CBC
HEMATOCRIT: 34.7 % — AB (ref 39.0–52.0)
Hemoglobin: 11.7 g/dL — ABNORMAL LOW (ref 13.0–17.0)
MCH: 29.9 pg (ref 26.0–34.0)
MCHC: 33.7 g/dL (ref 30.0–36.0)
MCV: 88.7 fL (ref 78.0–100.0)
PLATELETS: 104 10*3/uL — AB (ref 150–400)
RBC: 3.91 MIL/uL — ABNORMAL LOW (ref 4.22–5.81)
RDW: 16.4 % — AB (ref 11.5–15.5)
WBC: 4.1 10*3/uL (ref 4.0–10.5)

## 2015-09-08 LAB — C DIFFICILE QUICK SCREEN W PCR REFLEX
C DIFFICILE (CDIFF) INTERP: NEGATIVE
C Diff antigen: NEGATIVE
C Diff toxin: NEGATIVE

## 2015-09-08 MED ORDER — ENOXAPARIN SODIUM 150 MG/ML ~~LOC~~ SOLN
130.0000 mg | Freq: Once | SUBCUTANEOUS | Status: AC
  Administered 2015-09-08: 130 mg via SUBCUTANEOUS
  Filled 2015-09-08: qty 1

## 2015-09-08 MED ORDER — COUMADIN BOOK
Freq: Once | Status: AC
  Administered 2015-09-10: 10:00:00
  Filled 2015-09-08: qty 1

## 2015-09-08 MED ORDER — WARFARIN - PHARMACIST DOSING INPATIENT
Freq: Every day | Status: DC
  Administered 2015-09-08: 18:00:00

## 2015-09-08 MED ORDER — WARFARIN VIDEO: Freq: Once | Status: DC

## 2015-09-08 MED ORDER — ENOXAPARIN SODIUM 100 MG/ML ~~LOC~~ SOLN
1.0000 mg/kg | Freq: Two times a day (BID) | SUBCUTANEOUS | Status: DC
  Administered 2015-09-09 – 2015-09-10 (×3): 100 mg via SUBCUTANEOUS
  Filled 2015-09-08 (×3): qty 1

## 2015-09-08 MED ORDER — POTASSIUM CHLORIDE CRYS ER 20 MEQ PO TBCR
40.0000 meq | EXTENDED_RELEASE_TABLET | ORAL | Status: AC
  Administered 2015-09-08 – 2015-09-09 (×3): 40 meq via ORAL
  Filled 2015-09-08 (×3): qty 2

## 2015-09-08 MED ORDER — WARFARIN SODIUM 4 MG PO TABS
4.0000 mg | ORAL_TABLET | Freq: Once | ORAL | Status: AC
Start: 2015-09-08 — End: 2015-09-08
  Administered 2015-09-08: 4 mg via ORAL
  Filled 2015-09-08: qty 1

## 2015-09-08 MED ORDER — HEPARIN BOLUS VIA INFUSION
2500.0000 [IU] | Freq: Once | INTRAVENOUS | Status: AC
  Administered 2015-09-08: 2500 [IU] via INTRAVENOUS
  Filled 2015-09-08: qty 2500

## 2015-09-08 MED ORDER — HEPARIN (PORCINE) IN NACL 100-0.45 UNIT/ML-% IJ SOLN
1800.0000 [IU]/h | INTRAMUSCULAR | Status: DC
  Administered 2015-09-08: 1800 [IU]/h via INTRAVENOUS

## 2015-09-08 NOTE — Progress Notes (Signed)
[facility] PROGRESS NOTE  Culley Hedeen EXH:[id number] DOB: [birth year] DOA: 09/02/2015 PCP: Leonard Downing, MD  Assessment/Plan: 1. Bacteremia- blood cultures total 2/2 bottles growing gram-positive cocci in clusters. Antibiotic changed to IV Cefazolin per ID. We will get cardiac consultation for transesophageal echocardiogram. 2. RLE DVT- Patient has RLE DVT, noted in the right femoral vein, right popliteal vein, right posterior tibial veins, and right peroneal veins. Will start heparin per pharmacy consultation, today the platelet count has come up to 98. He was not given Lovenox for DVT prophylaxis due to thrombocytopenia as platelet count was less than 100. 3. UTI- continue cefazolin Urine culture growing staph aureus, MSSA. Continue cefazolin 4. Rhabdomyolysis- patient presented with CK of 3381, after IV fluids today CK is 811. Continue  IV fluids. 5. Elevated troponin- patient presented with relative troponin, which has come down to 0.49. Likely from rhabdomyolysis. Cardiology saw the patient and deemed no ischemic workup at this time as patient had normal wall motion on echo. 6. Hyponatremia- resolved. Likely from dehydration. 7. Hypokalemia- will replace potassium, and check BMP in a.m. 8. Transaminitis/liver cirrhosis- secondary to liver cirrhosis, liver enzymes are improving today AST is 68, ALT 43, alkaline phosphatase 26. 9. Encephalopathy- likely multifactorial from alcohol abuse, UTI, rhabdomyolysis. Continue supportive management. Ammonia level 31.CIWA protocol. 10. Alcohol abuse- continue CIWA protocol 11. 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 12. Leukopenia- resolved,  WBC is 4.1 today, likely from liver cirrhosis.  13. Syncope- likely multifactorial. Cardiology following. 14. DVT prophylaxis- SCD's. He was not given Lovenox due to thrombocytopenia.  Code Status: Full code Family  Communication: No family at bedside Disposition Plan: SNF   Consultants:  cardiology   Procedures:  None  Antibiotics:  Vancomycin  Zosyn  Cefazolin  HPI/Subjective: 64 y.o. male with a history of alcohol abuse, Mallory-Weiss tear, seizures, thrombocytopenia, gastric ulcer with GI bleeding who presents with altered bowel complaints. He was slightly confused on arrival and was a difficult historian. He complains of diarrhea for 5 days. He passed out at home on Sunday. Her EMS report the patient was not ambulating at home well and having multiple falls. In the ER he was found to be febrile with a temp of 101.4, tachycardic and had a lactic acid of 3.6. UA was concerning for UTI. Sodium was 127 and LFTs were elevated. He e was started on vancomycin and Zosyn.  Patient more alert, oriented x 3.   Objective: Filed Vitals:   09/08/15 1314  BP: 142/63  Pulse: 93  Temp: 98.7 F (37.1 C)  Resp: 18    Intake/Output Summary (Last 24 hours) at 09/08/15 1507 Last data filed at 09/08/15 1442  Gross per 24 hour  Intake 3681.2 ml  Output      0 ml  Net 3681.2 ml   Filed Weights   09/02/15 2155 09/03/15 0336 09/05/15 0400  Weight: 90.719 kg (200 lb) 96.8 kg (213 lb 6.5 oz) 98.7 kg (217 lb 9.5 oz)    Exam:   General:  Confused  Cardiovascular: S1S2 RRR  Respiratory: Clear bilaterally, no wheezing  Abdomen: Soft, nontender, no organomegaly  Musculoskeletal: RLE  Edematous,tender to palpation, homans sign positive  Data Reviewed: Basic Metabolic Panel:  Recent Labs Lab 09/04/15 0357 09/05/15 0410 09/06/15 0435 09/07/15 1020 09/08/15 1115  NA 133* 137 137 133* 131*  K 3.3* 2.3* 3.1* 3.2* 3.0*  CL 103 113* 106 105 102  CO2 20* 18* 21* 22 22  GLUCOSE 159* 122* 143* 190* 192*  BUN 16 12 14 10 8   CREATININE 0.55* <0.30* 0.39* 0.39* 0.41*  CALCIUM 8.3* 6.0* 7.9* 7.6* 7.5*   Liver Function Tests:  Recent Labs Lab 09/04/15 0357 09/05/15 0410 09/06/15 0435  09/07/15 1020 09/08/15 1115  AST 101* 68* 72* 98* 69*  ALT 66* 43 45 40 27  ALKPHOS 26* 31* 53 70 57  BILITOT 3.8* 5.7* 3.8* 3.4* 3.3*  PROT 6.0* 4.3* 6.1* 6.2* 6.1*  ALBUMIN 2.3* 1.5* 2.1* 2.0* 1.9*   No results for input(s): LIPASE, AMYLASE in the last 168 hours.  Recent Labs Lab 09/03/15 0620  AMMONIA 26   CBC:  Recent Labs Lab 09/03/15 0011  09/04/15 0357 09/05/15 0410 09/06/15 0435 09/07/15 1020 09/08/15 1115  WBC 5.3  < > 3.1* 1.9* 2.7* 3.5* 4.1  NEUTROABS 4.3  --   --   --   --   --   --   HGB 14.8  < > 13.0 10.2* 12.4* 12.1* 11.7*  HCT 41.8  < > 36.8* 29.5* 36.2* 35.6* 34.7*  MCV 85.0  < > 86.4 87.8 87.9 89.2 88.7  PLT 46*  < > 49* 41* 75* 98* 104*  < > = values in this interval not displayed. Cardiac Enzymes:  Recent Labs Lab 09/03/15 0011 09/03/15 0620 09/03/15 1300 09/03/15 1820 09/04/15 0357  CKTOTAL 3381* 2552*  --   --  811*  TROPONINI 1.26* 0.84* 0.59* 0.49*  --    BNP (last 3 results) No results for input(s): BNP in the last 8760 hours.  ProBNP (last 3 results) No results for input(s): PROBNP in the last 8760 hours.  CBG:  Recent Labs Lab 09/03/15 0821 09/04/15 0733 09/05/15 0813 09/07/15 0729 09/08/15 0728  GLUCAP 146* 176* 139* 162* 141*    Recent Results (from the past 240 hour(s))  Blood culture (routine x 2)     Status: None   Collection Time: 09/03/15 12:11 AM  Result Value Ref Range Status   Specimen Description BLOOD RIGHT HAND  Final   Special Requests BOTTLES DRAWN AEROBIC AND ANAEROBIC 5CC  Final   Culture  Setup Time   Final    GRAM POSITIVE COCCI IN CLUSTERS IN BOTH AEROBIC AND ANAEROBIC BOTTLES CRITICAL RESULT CALLED TO, READ BACK BY AND VERIFIED WITH: S HOFFLER,RN AT 1426 09/03/15 BY L BENFIELD    Culture   Final    STAPHYLOCOCCUS AUREUS Performed at [facility]    Report Status 09/05/2015 FINAL  Final   Organism ID, Bacteria STAPHYLOCOCCUS AUREUS  Final      Susceptibility   Staphylococcus aureus -  MIC*    CIPROFLOXACIN <=0.5 SENSITIVE Sensitive     ERYTHROMYCIN <=0.25 SENSITIVE Sensitive     GENTAMICIN <=0.5 SENSITIVE Sensitive     OXACILLIN 0.5 SENSITIVE Sensitive     TETRACYCLINE <=1 SENSITIVE Sensitive     VANCOMYCIN 1 SENSITIVE Sensitive     TRIMETH/SULFA <=10 SENSITIVE Sensitive     CLINDAMYCIN <=0.25 SENSITIVE Sensitive     RIFAMPIN <=0.5 SENSITIVE Sensitive     Inducible Clindamycin NEGATIVE Sensitive     * STAPHYLOCOCCUS AUREUS  Blood culture (routine x 2)     Status: None   Collection Time: 09/03/15 12:11 AM  Result Value Ref Range Status   Specimen Description BLOOD LEFT HAND  Final   Special Requests BOTTLES DRAWN AEROBIC AND ANAEROBIC 5CC  Final   Culture  Setup Time   Final    GRAM POSITIVE COCCI IN  CLUSTERS IN BOTH AEROBIC AND ANAEROBIC BOTTLES CRITICAL RESULT CALLED TO, READ BACK BY AND VERIFIED WITH: S HOFFLER,RN AT 1426 09/03/15 BY L BENFIELD    Culture   Final    STAPHYLOCOCCUS AUREUS SUSCEPTIBILITIES PERFORMED ON PREVIOUS CULTURE WITHIN THE LAST 5 DAYS. Performed at [facility]    Report Status 09/05/2015 FINAL  Final  Urine culture     Status: None   Collection Time: 09/03/15 12:24 AM  Result Value Ref Range Status   Specimen Description URINE, CATHETERIZED  Final   Special Requests NONE  Final   Culture   Final    >=100,000 COLONIES/mL STAPHYLOCOCCUS AUREUS Performed at [facility]    Report Status 09/05/2015 FINAL  Final   Organism ID, Bacteria STAPHYLOCOCCUS AUREUS  Final      Susceptibility   Staphylococcus aureus - MIC*    CIPROFLOXACIN <=0.5 SENSITIVE Sensitive     GENTAMICIN <=0.5 SENSITIVE Sensitive     NITROFURANTOIN <=16 SENSITIVE Sensitive     OXACILLIN 0.5 SENSITIVE Sensitive     TETRACYCLINE <=1 SENSITIVE Sensitive     VANCOMYCIN <=0.5 SENSITIVE Sensitive     TRIMETH/SULFA <=10 SENSITIVE Sensitive     CLINDAMYCIN <=0.25 SENSITIVE Sensitive     RIFAMPIN <=0.5 SENSITIVE Sensitive     Inducible Clindamycin NEGATIVE  Sensitive     * >=100,000 COLONIES/mL STAPHYLOCOCCUS AUREUS  MRSA PCR Screening     Status: None   Collection Time: 09/03/15  3:53 AM  Result Value Ref Range Status   MRSA by PCR NEGATIVE NEGATIVE Final    Comment:        The GeneXpert MRSA Assay (FDA approved for NASAL specimens only), is one component of a comprehensive MRSA colonization surveillance program. It is not intended to diagnose MRSA infection nor to guide or monitor treatment for MRSA infections.   Culture, blood (routine x 2)     Status: None (Preliminary result)   Collection Time: 09/05/15  6:25 PM  Result Value Ref Range Status   Specimen Description BLOOD RIGHT HAND  10 ML IN AEROBIC ONLY  Final   Special Requests Immunocompromised  Final   Culture   Final    NO GROWTH 3 DAYS Performed at [facility]    Report Status PENDING  Incomplete  C difficile quick scan w PCR reflex     Status: None   Collection Time: 09/08/15 10:50 AM  Result Value Ref Range Status   C Diff antigen NEGATIVE NEGATIVE Final   C Diff toxin NEGATIVE NEGATIVE Final   C Diff interpretation Negative for toxigenic C. difficile  Final     Studies: No results found.  Scheduled Meds: . atorvastatin  40 mg Oral q1800  .  ceFAZolin (ANCEF) IV  2 g Intravenous 3 times per day  . [START ON 09/09/2015] coumadin book   Does not apply Once  . [START ON 09/09/2015] enoxaparin (LOVENOX) injection  1 mg/kg Subcutaneous Q12H  . famotidine (PEPCID) IV  20 mg Intravenous Q12H  . folic acid  1 mg Oral Daily  . gabapentin  600 mg Oral TID  . Influenza vac split quadrivalent PF  0.5 mL Intramuscular Tomorrow-1000  . multivitamin with minerals  1 tablet Oral Daily  . pneumococcal 23 valent vaccine  0.5 mL Intramuscular Tomorrow-1000  . sodium chloride  3 mL Intravenous Q12H  . thiamine  100 mg Oral Daily   Or  . thiamine  100 mg Intravenous Daily  . warfarin  4 mg Oral  ONCE-1800  . [START ON 09/09/2015] warfarin   Does not apply Once  .  Warfarin - Pharmacist Dosing Inpatient   Does not apply q1800   Continuous Infusions: . sodium chloride Stopped (09/08/15 1337)    Principal Problem:   Staphylococcus aureus bacteremia Active Problems:   ETOH abuse   Neuropathy   Gastric ulcer   Weakness of right leg   Hyponatremia   Thrombocytopenia   Cirrhosis of liver   Splenomegaly   Sepsis   Abnormal LFTs   Mediastinal widening   Acute encephalopathy   Syncope   Rhabdomyolysis   Elevated troponin   Long QT interval   Hyperglycemia   Alcoholic cirrhosis of liver without ascites   DVT (deep venous thrombosis)    Time spent: 25 min    [facility] Pager [phone]. If 7PM-7AM, please contact night-coverage at [email or web address], password [facility] : [facility] 09/08/2015, 3:07 PM  LOS: 5 days

## 2015-09-08 NOTE — Progress Notes (Signed)
Patient ID: Joshua Waller, male   DOB: 04-Oct-1951, 64 y.o.   MRN: [id number]         [facility]    Date of Admission:  09/02/2015    Total days of antibiotics 7         Principal Problem:   Staphylococcus aureus bacteremia Active Problems:   ETOH abuse   Neuropathy   Gastric ulcer   Weakness of right leg   Hyponatremia   Thrombocytopenia   Cirrhosis of liver   Splenomegaly   Sepsis   Abnormal LFTs   Mediastinal widening   Acute encephalopathy   Syncope   Rhabdomyolysis   Elevated troponin   Long QT interval   Hyperglycemia   Alcoholic cirrhosis of liver without ascites   DVT (deep venous thrombosis)   . atorvastatin  40 mg Oral q1800  .  ceFAZolin (ANCEF) IV  2 g Intravenous 3 times per day  . [START ON 09/09/2015] coumadin book   Does not apply Once  . [START ON 09/09/2015] enoxaparin (LOVENOX) injection  1 mg/kg Subcutaneous Q12H  . famotidine (PEPCID) IV  20 mg Intravenous Q12H  . folic acid  1 mg Oral Daily  . gabapentin  600 mg Oral TID  . Influenza vac split quadrivalent PF  0.5 mL Intramuscular Tomorrow-1000  . multivitamin with minerals  1 tablet Oral Daily  . pneumococcal 23 valent vaccine  0.5 mL Intramuscular Tomorrow-1000  . potassium chloride  40 mEq Oral Q4H  . sodium chloride  3 mL Intravenous Q12H  . thiamine  100 mg Oral Daily   Or  . thiamine  100 mg Intravenous Daily  . warfarin  4 mg Oral ONCE-1800  . [START ON 09/09/2015] warfarin   Does not apply Once  . Warfarin - Pharmacist Dosing Inpatient   Does not apply q1800    Review of Systems: Review of systems not obtained due to patient factors.  Past Medical History  Diagnosis Date  . Lumbar disc disorder   . Anemia   . Neuropathy of lower extremity 2007    very poor balance  . Glaucoma (increased eye pressure)     bilateral  . Gastric ulcer   . Chronic back pain   . Alcohol abuse   . 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 Gastritis   . GI bleeding   . Seizures    unknown type  . Arthritis     back & neck    Social History  Substance Use Topics  . Smoking status: Never Smoker   . Smokeless tobacco: Never Used  . Alcohol Use: 3.6 oz/week    6 Cans of beer per week     Comment: heavy drinker    Family History  Problem Relation Age of Onset  . Heart disease Mother   . Lumbar disc disease Father    Allergies  Allergen Reactions  . Aspirin Other (See Comments)    Ulcers.    OBJECTIVE: Filed Vitals:   09/07/15 1328 09/07/15 2132 09/08/15 0414 09/08/15 1314  BP: 130/64 143/63 154/63 142/63  Pulse: 83 91 86 93  Temp: 97.8 F (36.6 C) 99.6 F (37.6 C) 98.2 F (36.8 C) 98.7 F (37.1 C)  TempSrc: Oral Oral Oral Oral  Resp: 16 19 18 18   Height:      Weight:      SpO2: 93% 94% 96% 97%   Body mass index is 32.12 kg/(m^2).  General: He is asleep but arouses to voice  answers simple questions with one-word answers Skin: No rash Lungs: Clear Cor: Regular S1 and S2 with a 2/6 early systolic murmur  Lab Results Lab Results  Component Value Date   WBC 4.1 09/08/2015   HGB 11.7* 09/08/2015   HCT 34.7* 09/08/2015   MCV 88.7 09/08/2015   PLT 104* 09/08/2015    Lab Results  Component Value Date   CREATININE 0.41* 09/08/2015   BUN 8 09/08/2015   NA 131* 09/08/2015   K 3.0* 09/08/2015   CL 102 09/08/2015   CO2 22 09/08/2015    Lab Results  Component Value Date   ALT 27 09/08/2015   AST 69* 09/08/2015   ALKPHOS 57 09/08/2015   BILITOT 3.3* 09/08/2015     Microbiology: Recent Results (from the past 240 hour(s))  Blood culture (routine x 2)     Status: None   Collection Time: 09/03/15 12:11 AM  Result Value Ref Range Status   Specimen Description BLOOD RIGHT HAND  Final   Special Requests BOTTLES DRAWN AEROBIC AND ANAEROBIC 5CC  Final   Culture  Setup Time   Final    GRAM POSITIVE COCCI IN CLUSTERS IN BOTH AEROBIC AND ANAEROBIC BOTTLES CRITICAL RESULT CALLED TO, READ BACK BY AND VERIFIED WITH: S HOFFLER,RN AT 1426 09/03/15  BY L BENFIELD    Culture   Final    STAPHYLOCOCCUS AUREUS Performed at [facility]    Report Status 09/05/2015 FINAL  Final   Organism ID, Bacteria STAPHYLOCOCCUS AUREUS  Final      Susceptibility   Staphylococcus aureus - MIC*    CIPROFLOXACIN <=0.5 SENSITIVE Sensitive     ERYTHROMYCIN <=0.25 SENSITIVE Sensitive     GENTAMICIN <=0.5 SENSITIVE Sensitive     OXACILLIN 0.5 SENSITIVE Sensitive     TETRACYCLINE <=1 SENSITIVE Sensitive     VANCOMYCIN 1 SENSITIVE Sensitive     TRIMETH/SULFA <=10 SENSITIVE Sensitive     CLINDAMYCIN <=0.25 SENSITIVE Sensitive     RIFAMPIN <=0.5 SENSITIVE Sensitive     Inducible Clindamycin NEGATIVE Sensitive     * STAPHYLOCOCCUS AUREUS  Blood culture (routine x 2)     Status: None   Collection Time: 09/03/15 12:11 AM  Result Value Ref Range Status   Specimen Description BLOOD LEFT HAND  Final   Special Requests BOTTLES DRAWN AEROBIC AND ANAEROBIC 5CC  Final   Culture  Setup Time   Final    GRAM POSITIVE COCCI IN CLUSTERS IN BOTH AEROBIC AND ANAEROBIC BOTTLES CRITICAL RESULT CALLED TO, READ BACK BY AND VERIFIED WITH: S HOFFLER,RN AT 1426 09/03/15 BY L BENFIELD    Culture   Final    STAPHYLOCOCCUS AUREUS SUSCEPTIBILITIES PERFORMED ON PREVIOUS CULTURE WITHIN THE LAST 5 DAYS. Performed at [facility]    Report Status 09/05/2015 FINAL  Final  Urine culture     Status: None   Collection Time: 09/03/15 12:24 AM  Result Value Ref Range Status   Specimen Description URINE, CATHETERIZED  Final   Special Requests NONE  Final   Culture   Final    >=100,000 COLONIES/mL STAPHYLOCOCCUS AUREUS Performed at [facility]    Report Status 09/05/2015 FINAL  Final   Organism ID, Bacteria STAPHYLOCOCCUS AUREUS  Final      Susceptibility   Staphylococcus aureus - MIC*    CIPROFLOXACIN <=0.5 SENSITIVE Sensitive     GENTAMICIN <=0.5 SENSITIVE Sensitive     NITROFURANTOIN <=16 SENSITIVE Sensitive     OXACILLIN 0.5 SENSITIVE Sensitive  TETRACYCLINE <=1 SENSITIVE Sensitive     VANCOMYCIN <=0.5 SENSITIVE Sensitive     TRIMETH/SULFA <=10 SENSITIVE Sensitive     CLINDAMYCIN <=0.25 SENSITIVE Sensitive     RIFAMPIN <=0.5 SENSITIVE Sensitive     Inducible Clindamycin NEGATIVE Sensitive     * >=100,000 COLONIES/mL STAPHYLOCOCCUS AUREUS  MRSA PCR Screening     Status: None   Collection Time: 09/03/15  3:53 AM  Result Value Ref Range Status   MRSA by PCR NEGATIVE NEGATIVE Final    Comment:        The GeneXpert MRSA Assay (FDA approved for NASAL specimens only), is one component of a comprehensive MRSA colonization surveillance program. It is not intended to diagnose MRSA infection nor to guide or monitor treatment for MRSA infections.   Culture, blood (routine x 2)     Status: None (Preliminary result)   Collection Time: 09/05/15  6:25 PM  Result Value Ref Range Status   Specimen Description BLOOD RIGHT HAND  10 ML IN AEROBIC ONLY  Final   Special Requests Immunocompromised  Final   Culture   Final    NO GROWTH 3 DAYS Performed at [facility]    Report Status PENDING  Incomplete  C difficile quick scan w PCR reflex     Status: None   Collection Time: 09/08/15 10:50 AM  Result Value Ref Range Status   C Diff antigen NEGATIVE NEGATIVE Final   C Diff toxin NEGATIVE NEGATIVE Final   C Diff interpretation Negative for toxigenic C. difficile  Final     ASSESSMENT: He has MSSA bacteremia.  Repeat blood culture remains negative. I will order PICC placement. There was no evidence of endocarditis by transthoracic echocardiogram but I feel he should have a TEE.  PLAN: 1. Continue cefazolin 2. PICC placement  3. Recommend TEE  Michel Bickers, MD [facility] [phone] pager   [phone] cell 09/08/2015, 4:17 PM
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
## 2015-09-08 NOTE — Progress Notes (Signed)
Physical Therapy Treatment Patient Details Name: Joshua Waller MRN: [id number] DOB: 08/02/1951 Today's Date: 09/08/2015    History of Present Illness This pt admitted with syncope, increased urinary frequency, diarrhea, right leg weakness, pain over both thighs, AMS. Found to have UTI and rhabdomyolysis, right anterior 4th/5th rib fxs, CT/MRI negative for any acute strokes. PMHx:alcohol abuse, Mallory-Weiss tear, seizure, thrombocytopenia, gastric ulcer, GI bleeding, OA (neck and back), and neuropathy    PT Comments    Assisted transfering pt from supine to EOB required increased assist level and increased time.  Limited functional use of R UE due to pain and edema.  Once upright, pt sat EOB x 7 min at supervision while waiting for 2nd assist.  Attempted sit to stand + 2 assist however pt was unable to tolerate WBing thru R LE and unable to clear buttocks off bed.  Used HOYER Lift to assist pt from bed to [facility] per request to have a BM.  Pt sat on BSC approx 10 min with supervision.  Assisted with hygiene and transferred pt from [facility] to recliner via [facility].  Increased time to position to comfort and elevated both R LE/UE.  Rec nursing use lift to assist back to bed.    Follow Up Recommendations  SNF     Equipment Recommendations       Recommendations for Other Services       Precautions / Restrictions Precautions Precautions: Fall Precaution Comments: right anterior 4th/5th rib fxs, RUE is very painful Restrictions Weight Bearing Restrictions: No    Mobility  Bed Mobility Overal bed mobility: Needs Assistance Bed Mobility: Supine to Sit     Supine to sit: +2 for physical assistance;+2 for safety/equipment;Total assist (pt15%)     General bed mobility comments: Total Assist to transfer pt to EOB pt 15%  Transfers Overall transfer level: Needs assistance Equipment used: Rolling walker (2 wheeled) Transfers: Sit to/from Stand           General transfer comment: attempted  sit to stand + 2 assist however pt was unable to tolerate WB thru R LE and unable to rise hips off bed.  Used Hoyer Lift to transfer pt from EOB to [facility] then to recliner.  Ambulation/Gait             General Gait Details: non amb at this time   Stairs            Wheelchair Mobility    Modified Rankin (Stroke Patients Only)       Balance                                    Cognition Arousal/Alertness: Awake/alert Behavior During Therapy: WFL for tasks assessed/performed Overall Cognitive Status: Impaired/Different from baseline Area of Impairment: Orientation;Following commands               General Comments: groggy/slow    Exercises      General Comments        Pertinent Vitals/Pain Pain Assessment: Faces Faces Pain Scale: Hurts even more Pain Location: R UE and whole R side Pain Descriptors / Indicators: Discomfort;Grimacing Pain Intervention(s): Repositioned;Monitored during session    Home Living                      Prior Function            PT Goals (current goals can now be found  in the care plan section) Progress towards PT goals: Progressing toward goals    Frequency  Min 3X/week    PT Plan      Co-evaluation             End of Session Equipment Utilized During Treatment: Gait belt Activity Tolerance: Patient limited by pain;Patient limited by fatigue;Treatment limited secondary to medical complications (Comment) (edema esp R UE and R LE)       Time: 2707-8675 PT Time Calculation (min) (ACUTE ONLY): 45 min  Charges:  $Therapeutic Activity: 38-52 mins                    G Codes:      Rica Koyanagi  PTA [facility]  Acute  Rehab Pager      [phone]

## 2015-09-08 NOTE — Progress Notes (Signed)
OT Cancellation Note  Patient Details Name: Joshua Waller MRN: [id number] DOB: 07/29/1951   Cancelled Treatment:    Reason Eval/Treat Not Completed: Other (comment)  Pt is meeting with SW.  Will return later if schedule permits.  Zyir Gassert 09/08/2015, 1:53 PM  Lesle Chris, OTR/L [phone] 09/08/2015

## 2015-09-08 NOTE — Clinical Social Work Placement (Signed)
   CLINICAL SOCIAL WORK PLACEMENT  NOTE  Date:  09/08/2015  Patient Details  Name: Joshua Waller MRN: [id number] Date of Birth: 28-May-1951  Clinical Social Work is seeking post-discharge placement for this patient at the [place] level of care (*CSW will initial, date and re-position this form in  chart as items are completed):  Yes   Patient/family provided with [facility] Work Department's list of facilities offering this level of care within the geographic area requested by the patient (or if unable, by the patient's family).  Yes   Patient/family informed of their freedom to choose among providers that offer the needed level of care, that participate in Medicare, Medicaid or managed care program needed by the patient, have an available bed and are willing to accept the patient.  Yes   Patient/family informed of [facility]'s ownership interest in [facility] and [facility], as well as of the fact that they are under no obligation to receive care at these facilities.  PASRR submitted to EDS on 09/08/15     PASRR number received on 09/08/15     Existing PASRR number confirmed on       FL2 transmitted to all facilities in geographic area requested by pt/family on 09/08/15     FL2 transmitted to all facilities within larger geographic area on       Patient informed that his/her managed care company has contracts with or will negotiate with certain facilities, including the following:            Patient/family informed of bed offers received.  Patient chooses bed at       Physician recommends and patient chooses bed at      Patient to be transferred to   on  .  Patient to be transferred to facility by       Patient family notified on   of transfer.  Name of family member notified:        PHYSICIAN Please sign FL2     Additional Comment:    _______________________________________________ Ladell Pier, LCSW 09/08/2015, 4:21  PM

## 2015-09-08 NOTE — Progress Notes (Signed)
ANTICOAGULATION CONSULT NOTE - Initial Consult  Pharmacy Consult for Heparin IV >> Lovenox + warfarin Indication: new DVT  Allergies  Allergen Reactions  . Aspirin Other (See Comments)    Ulcers.    Patient Measurements: Height: 5\' 9"  (175.3 cm) Weight: 217 lb 9.5 oz (98.7 kg) IBW/kg (Calculated) : 70.7 Heparin Dosing Weight: 91 kg  Vital Signs: Temp: 98.7 F (37.1 C) (09/13 1314) Temp Source: Oral (09/13 1314) BP: 142/63 mmHg (09/13 1314) Pulse Rate: 93 (09/13 1314)  Labs:  Recent Labs  09/06/15 0435 09/07/15 1020 09/07/15 1744 09/08/15 0158 09/08/15 1115  HGB 12.4* 12.1*  --   --  11.7*  HCT 36.2* 35.6*  --   --  34.7*  PLT 75* 98*  --   --  104*  HEPARINUNFRC  --   --  <0.10* 0.13* <0.10*  CREATININE 0.39* 0.39*  --   --  0.41*    Estimated Creatinine Clearance: 106.6 mL/min (by C-G formula based on Cr of 0.41).   Medical History: Past Medical History  Diagnosis Date  . Lumbar disc disorder   . Anemia   . Neuropathy of lower extremity 2007    very poor balance  . Glaucoma (increased eye pressure)     bilateral  . Gastric ulcer   . Chronic back pain   . Alcohol abuse   . 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 Gastritis   . GI bleeding   . Seizures     unknown type  . Arthritis     back & neck    Medications:  Scheduled:  . atorvastatin  40 mg Oral q1800  .  ceFAZolin (ANCEF) IV  2 g Intravenous 3 times per day  . famotidine (PEPCID) IV  20 mg Intravenous Q12H  . folic acid  1 mg Oral Daily  . gabapentin  600 mg Oral TID  . Influenza vac split quadrivalent PF  0.5 mL Intramuscular Tomorrow-1000  . multivitamin with minerals  1 tablet Oral Daily  . pneumococcal 23 valent vaccine  0.5 mL Intramuscular Tomorrow-1000  . sodium chloride  3 mL Intravenous Q12H  . thiamine  100 mg Oral Daily   Or  . thiamine  100 mg Intravenous Daily   Infusions:  . sodium chloride 75 mL/hr at 09/08/15 0942  . heparin Stopped (09/08/15 1337)    Assessment: 75 yoM  w/ PMH cirrhosis and thrombocytopenia d/t EtOH abuse, gastric ulcer w/ GI bleeding, presented 9/8 with syncope, polyuria, diarrhea, R leg weakness, AMS.  Found to have bacteremia, UTI, rhabodmyolysis, for which he is currently being treated.  Patient did not receive pharmacological VTE ppx d/t low platelet count.  On 9/12, venous duplex shows RLE DVT.  MD ok to proceed with IV UFH per pharmacy as platelets have significantly improved   Baseline INR, aPTT: wnl  Prior anticoagulation: none  Baseline Albumin < 2.5  Significant events:  9/13: heparin level dropped and RN noticed line was infiltrated, probably x 2-3 hrs.  Spoke with MD about not delaying therapeutic anticoagulation given extent of DVT, and decision made to transition to Lovenox.  Warfarin was chosen for outpatient anticoag as patient not a good candidate for DOACs d/t liver disease, and he will need periodic monitoring anyway to watch for new thrombocytopenia or other coagulopathy.  Note that INR 1.27 on admission, so should still get some utility from INR monitoring, despite cirrhosis.    Today, 09/08/2015:  CBC: Hgb low but stable/improved.  Plt 42 on admit, now 104  Clear liquid diet, eating 100% of meals  CrCl: > 90 ml/min  Significant drug-drug interactions: none  Goal of Therapy: INR 2-3 LMWH level 0.6-1 units/ml 4 hrs post-dose Monitor platelets by anticoagulation protocol: Yes  Plan:  Heparin IV stopped  Lovenox 130 mg SQ x 1 now, then 100 mg SQ q12 hr.  Will not delay Lovenox as patient likely off heparin > 1 hr.  Warfarin 4 mg x 1 tonight at 1800, given   Daily INR, CBC at least q72 hrs while on Lovenox/warfarin  Will need to continue Lovenox bridge for at least 5 days AND until 2 consecutive therapeutic INRs.  Educate family on warfarin (SNF admission should cover Lovenox bridge)  Monitor for signs of bleeding or thrombosis.  Discussed low threshold for GIB workup, given cirrhosis and Hx GIB   Reuel Boom, PharmD Pager: [phone] 09/08/2015, 1:43 PM

## 2015-09-08 NOTE — Progress Notes (Signed)
ANTICOAGULATION CONSULT NOTE - Follow Up  Pharmacy Consult for Heparin IV Indication: DVT  Please see previous progress note from Reuel Boom , PharmD on 9/12 for full details.  Assessement:  0200 HL=0.13 Confirmed with RN that heparin drip infusing at correct rate of 1500 units/hr (15 ml/hr) with no IV issues noted. No bleeding complications reported.  Plan:  Rebolus heparin 2500 units IV x 1 now  Increase heparin infusion rate to 1800 units/hr  Recheck heparin level in 6 hours  Dorrene German 09/08/2015 4:07 AM

## 2015-09-09 ENCOUNTER — Inpatient Hospital Stay (HOSPITAL_COMMUNITY): Payer: Medicare Other

## 2015-09-09 ENCOUNTER — Encounter (HOSPITAL_COMMUNITY)
Admission: EM | Disposition: A | Discharge status: Skilled Nursing Facility | Payer: Self-pay | Source: Home / Self Care | Attending: Family Medicine

## 2015-09-09 ENCOUNTER — Encounter (HOSPITAL_COMMUNITY): Payer: Self-pay | Admitting: *Deleted

## 2015-09-09 ENCOUNTER — Ambulatory Visit (HOSPITAL_COMMUNITY): Admission: RE | Admit: 2015-09-09 | Payer: Medicare Other | Source: Ambulatory Visit | Admitting: Cardiology

## 2015-09-09 DIAGNOSIS — D696 Thrombocytopenia, unspecified: Secondary | ICD-10-CM

## 2015-09-09 DIAGNOSIS — K7031 Alcoholic cirrhosis of liver with ascites: Secondary | ICD-10-CM

## 2015-09-09 DIAGNOSIS — E871 Hypo-osmolality and hyponatremia: Secondary | ICD-10-CM

## 2015-09-09 DIAGNOSIS — B9561 Methicillin susceptible Staphylococcus aureus infection as the cause of diseases classified elsewhere: Secondary | ICD-10-CM

## 2015-09-09 DIAGNOSIS — F1010 Alcohol abuse, uncomplicated: Secondary | ICD-10-CM

## 2015-09-09 DIAGNOSIS — M6282 Rhabdomyolysis: Secondary | ICD-10-CM

## 2015-09-09 DIAGNOSIS — K7030 Alcoholic cirrhosis of liver without ascites: Secondary | ICD-10-CM

## 2015-09-09 DIAGNOSIS — R7881 Bacteremia: Secondary | ICD-10-CM

## 2015-09-09 DIAGNOSIS — A4901 Methicillin susceptible Staphylococcus aureus infection, unspecified site: Secondary | ICD-10-CM

## 2015-09-09 HISTORY — PX: TEE WITHOUT CARDIOVERSION: SHX5443

## 2015-09-09 LAB — CBC
HCT: 32.9 % — ABNORMAL LOW (ref 39.0–52.0)
HEMOGLOBIN: 11.1 g/dL — AB (ref 13.0–17.0)
MCH: 30 pg (ref 26.0–34.0)
MCHC: 33.7 g/dL (ref 30.0–36.0)
MCV: 88.9 fL (ref 78.0–100.0)
PLATELETS: 115 10*3/uL — AB (ref 150–400)
RBC: 3.7 MIL/uL — AB (ref 4.22–5.81)
RDW: 16.3 % — ABNORMAL HIGH (ref 11.5–15.5)
WBC: 4.2 10*3/uL (ref 4.0–10.5)

## 2015-09-09 LAB — COMPREHENSIVE METABOLIC PANEL
ALBUMIN: 1.7 g/dL — AB (ref 3.5–5.0)
ALK PHOS: 56 U/L (ref 38–126)
ALT: 20 U/L (ref 17–63)
AST: 56 U/L — ABNORMAL HIGH (ref 15–41)
Anion gap: 7 (ref 5–15)
BUN: 6 mg/dL (ref 6–20)
CALCIUM: 7.7 mg/dL — AB (ref 8.9–10.3)
CHLORIDE: 102 mmol/L (ref 101–111)
CO2: 22 mmol/L (ref 22–32)
CREATININE: 0.44 mg/dL — AB (ref 0.61–1.24)
GFR calc non Af Amer: >60 mL/min (ref >60)
GLUCOSE: 132 mg/dL — AB (ref 65–99)
Potassium: 3.4 mmol/L — ABNORMAL LOW (ref 3.5–5.1)
SODIUM: 131 mmol/L — AB (ref 135–145)
Total Bilirubin: 3.7 mg/dL — ABNORMAL HIGH (ref 0.3–1.2)
Total Protein: 6 g/dL — ABNORMAL LOW (ref 6.5–8.1)

## 2015-09-09 LAB — GLUCOSE, CAPILLARY: Glucose-Capillary: 126 mg/dL — ABNORMAL HIGH (ref 65–99)

## 2015-09-09 LAB — PROTIME-INR
INR: 1.62 — ABNORMAL HIGH (ref 0.00–1.49)
Prothrombin Time: 19.3 seconds — ABNORMAL HIGH (ref 11.6–15.2)

## 2015-09-09 SURGERY — ECHOCARDIOGRAM, TRANSESOPHAGEAL
Anesthesia: Moderate Sedation

## 2015-09-09 MED ORDER — POTASSIUM CHLORIDE CRYS ER 20 MEQ PO TBCR
40.0000 meq | EXTENDED_RELEASE_TABLET | Freq: Two times a day (BID) | ORAL | Status: AC
  Administered 2015-09-09 (×2): 40 meq via ORAL
  Filled 2015-09-09 (×2): qty 2

## 2015-09-09 MED ORDER — WARFARIN SODIUM 3 MG PO TABS
3.0000 mg | ORAL_TABLET | Freq: Once | ORAL | Status: AC
  Administered 2015-09-09: 3 mg via ORAL
  Filled 2015-09-09: qty 1

## 2015-09-09 MED ORDER — MIDAZOLAM HCL 10 MG/2ML IJ SOLN
INTRAMUSCULAR | Status: DC | PRN
  Administered 2015-09-09 (×2): 2 mg via INTRAVENOUS

## 2015-09-09 MED ORDER — FENTANYL CITRATE (PF) 100 MCG/2ML IJ SOLN
INTRAMUSCULAR | Status: DC | PRN
  Administered 2015-09-09 (×2): 25 ug via INTRAVENOUS

## 2015-09-09 MED ORDER — DIPHENOXYLATE-ATROPINE 2.5-0.025 MG/5ML PO LIQD: 5.0000 mL | Freq: Four times a day (QID) | ORAL | Status: DC | PRN

## 2015-09-09 MED ORDER — DIPHENOXYLATE-ATROPINE 2.5-0.025 MG PO TABS: 1.0000 | ORAL_TABLET | Freq: Four times a day (QID) | ORAL | Status: DC | PRN

## 2015-09-09 MED ORDER — SODIUM CHLORIDE 0.9 % IV SOLN: INTRAVENOUS | Status: DC

## 2015-09-09 MED ORDER — SACCHAROMYCES BOULARDII 250 MG PO CAPS
250.0000 mg | ORAL_CAPSULE | Freq: Two times a day (BID) | ORAL | Status: DC
  Administered 2015-09-09 – 2015-09-10 (×3): 250 mg via ORAL
  Filled 2015-09-09 (×3): qty 1

## 2015-09-09 NOTE — Interval H&P Note (Signed)
History and Physical Interval Note:  09/09/2015 12:34 PM  Joshua Waller  has presented today for surgery, with the diagnosis of BACTEREMIA  The various methods of treatment have been discussed with the patient and family. After consideration of risks, benefits and other options for treatment, the patient has consented to  Procedure(s): TRANSESOPHAGEAL ECHOCARDIOGRAM (TEE) (INPATIENT AT [place])  (N/A) as a surgical intervention .  The patient's history has been reviewed, patient examined, no change in status, stable for surgery.  I have reviewed the patient's chart and labs.  Questions were answered to the patient's satisfaction.     Bensimhon, Quillian Quince

## 2015-09-09 NOTE — Progress Notes (Signed)
Physical Therapy Treatment Patient Details Name: Joshua Waller MRN: [id number] DOB: 30-Sep-1951 Today's Date: 09/09/2015    History of Present Illness This pt admitted with syncope, increased urinary frequency, diarrhea, right leg weakness, pain over both thighs, AMS. Found to have UTI and rhabdomyolysis, right anterior 4th/5th rib fxs, CT/MRI negative for any acute strokes. PMHx:alcohol abuse, Mallory-Weiss tear, seizure, thrombocytopenia, gastric ulcer, GI bleeding, OA (neck and back), and neuropathy    PT Comments    Pt encouraged to mobilize however resistant and with little participation with multiple attempts to sit EOB.  Pt stating he needed to go for a walk however when provided with assistance he would stop initiating mobility.  Pt appears confused today and cognitive status limiting mobility at this time.     Follow Up Recommendations  SNF     Equipment Recommendations  None recommended by PT    Recommendations for Other Services       Precautions / Restrictions Precautions Precautions: Fall Precaution Comments: right anterior 4th/5th rib fxs, R LE is very painful    Mobility  Bed Mobility Overal bed mobility: Needs Assistance;+2 for physical assistance Bed Mobility: Supine to Sit     Supine to sit: Total assist;+2 for physical assistance     General bed mobility comments: attempted multiple times to get to EOB however stopping stating his R LE hurt or he felt too tired or he just couldn't; provided encouragement and educated on importance to mobilize, pt resistant and not assisting  Transfers                    Ambulation/Gait                 Stairs            Wheelchair Mobility    Modified Rankin (Stroke Patients Only)       Balance                                    Cognition Arousal/Alertness: Awake/alert Behavior During Therapy: WFL for tasks assessed/performed Overall Cognitive Status: Impaired/Different  from baseline Area of Impairment: Following commands;Safety/judgement       Following Commands: Follows one step commands with increased time Safety/Judgement: Decreased awareness of deficits;Decreased awareness of safety     General Comments: confused today, thought RN (came in to fix IV pump) "threw" a key in bed with him, stating he needed to go for a walk and physically has been unable    Exercises General Exercises - Lower Extremity Ankle Circles/Pumps: AROM;Both;10 reps Heel Slides: AROM;Left;10 reps (assisted a couple times on R LE however too painful)    General Comments        Pertinent Vitals/Pain Pain Assessment: Faces Faces Pain Scale: Hurts whole lot Pain Location: R LE, mostly groin Pain Descriptors / Indicators: Grimacing;Guarding;Discomfort Pain Intervention(s): Repositioned;Limited activity within patient's tolerance;Monitored during session    Home Living                      Prior Function            PT Goals (current goals can now be found in the care plan section) Progress towards PT goals: Progressing toward goals    Frequency  Min 3X/week    PT Plan Current plan remains appropriate    Co-evaluation  End of Session   Activity Tolerance: Patient limited by fatigue;Patient limited by pain Patient left: in bed;with call bell/phone within reach;with bed alarm set     Time: 7034-0352 PT Time Calculation (min) (ACUTE ONLY): 16 min  Charges:  $Therapeutic Activity: 8-22 mins                    G Codes:      Klever Twyford,KATHrine E September 15, 2015, 10:59 AM Carmelia Bake, PT, DPT 09/13/2015 Pager: [phone]

## 2015-09-09 NOTE — Progress Notes (Signed)
Patient has had 3 watery stools overnight. Will continue to monitor patient.

## 2015-09-09 NOTE — Progress Notes (Signed)
Patient ID: Joshua Waller, male   DOB: 15-Mar-1951, 64 y.o.   MRN: [id number]         [facility]    Date of Admission:  09/02/2015    Total days of antibiotics 8         Principal Problem:   Staphylococcus aureus bacteremia Active Problems:   ETOH abuse   Neuropathy   Gastric ulcer   Weakness of right leg   Hyponatremia   Thrombocytopenia   Cirrhosis of liver   Splenomegaly   Sepsis   Abnormal LFTs   Mediastinal widening   Acute encephalopathy   Syncope   Rhabdomyolysis   Elevated troponin   Long QT interval   Hyperglycemia   Alcoholic cirrhosis of liver without ascites   DVT (deep venous thrombosis)   . atorvastatin  40 mg Oral q1800  .  ceFAZolin (ANCEF) IV  2 g Intravenous 3 times per day  . coumadin book   Does not apply Once  . enoxaparin (LOVENOX) injection  1 mg/kg Subcutaneous Q12H  . famotidine (PEPCID) IV  20 mg Intravenous Q12H  . folic acid  1 mg Oral Daily  . gabapentin  600 mg Oral TID  . Influenza vac split quadrivalent PF  0.5 mL Intramuscular Tomorrow-1000  . multivitamin with minerals  1 tablet Oral Daily  . pneumococcal 23 valent vaccine  0.5 mL Intramuscular Tomorrow-1000  . potassium chloride  40 mEq Oral BID  . saccharomyces boulardii  250 mg Oral BID  . sodium chloride  3 mL Intravenous Q12H  . thiamine  100 mg Oral Daily   Or  . thiamine  100 mg Intravenous Daily  . warfarin  3 mg Oral ONCE-1800  . warfarin   Does not apply Once  . Warfarin - Pharmacist Dosing Inpatient   Does not apply q1800    SUBJECTIVE: He is feeling much better today. He states that he is relieved to know that his echocardiogram looked okay.  Review of Systems: Review of systems not obtained due to patient factors.  Past Medical History  Diagnosis Date  . Lumbar disc disorder   . Anemia   . Neuropathy of lower extremity 2007    very poor balance  . Glaucoma (increased eye pressure)     bilateral  . Gastric ulcer   . Chronic back  pain   . Alcohol abuse   . 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 Gastritis   . GI bleeding   . Seizures     unknown type  . Arthritis     back & neck    Social History  Substance Use Topics  . Smoking status: Never Smoker   . Smokeless tobacco: Never Used  . Alcohol Use: 3.6 oz/week    6 Cans of beer per week     Comment: heavy drinker    Family History  Problem Relation Age of Onset  . Heart disease Mother   . Lumbar disc disease Father    Allergies  Allergen Reactions  . Aspirin Other (See Comments)    Ulcers.    OBJECTIVE: Filed Vitals:   09/09/15 1320 09/09/15 1325 09/09/15 1330 09/09/15 1409  BP:  146/59  145/62  Pulse:  90 88 87  Temp:    98.7 F (37.1 C)  TempSrc:    Oral  Resp: 24 22 25 20   Height:      Weight:      SpO2:  94% 95% 98%   Body mass  index is 32.12 kg/(m^2).  General: He is much more alert and talkative today Skin: No rash Lungs: Clear Cor: Regular S1 and S2 with a 1/6 early systolic murmur  Lab Results Lab Results  Component Value Date   WBC 4.2 09/09/2015   HGB 11.1* 09/09/2015   HCT 32.9* 09/09/2015   MCV 88.9 09/09/2015   PLT 115* 09/09/2015    Lab Results  Component Value Date   CREATININE 0.44* 09/09/2015   BUN 6 09/09/2015   NA 131* 09/09/2015   K 3.4* 09/09/2015   CL 102 09/09/2015   CO2 22 09/09/2015    Lab Results  Component Value Date   ALT 20 09/09/2015   AST 56* 09/09/2015   ALKPHOS 56 09/09/2015   BILITOT 3.7* 09/09/2015     Microbiology: Recent Results (from the past 240 hour(s))  Blood culture (routine x 2)     Status: None   Collection Time: 09/03/15 12:11 AM  Result Value Ref Range Status   Specimen Description BLOOD RIGHT HAND  Final   Special Requests BOTTLES DRAWN AEROBIC AND ANAEROBIC 5CC  Final   Culture  Setup Time   Final    GRAM POSITIVE COCCI IN CLUSTERS IN BOTH AEROBIC AND ANAEROBIC BOTTLES CRITICAL RESULT CALLED TO, READ BACK BY AND VERIFIED WITH: S HOFFLER,RN AT 1426 09/03/15 BY L BENFIELD     Culture   Final    STAPHYLOCOCCUS AUREUS Performed at [facility]    Report Status 09/05/2015 FINAL  Final   Organism ID, Bacteria STAPHYLOCOCCUS AUREUS  Final      Susceptibility   Staphylococcus aureus - MIC*    CIPROFLOXACIN <=0.5 SENSITIVE Sensitive     ERYTHROMYCIN <=0.25 SENSITIVE Sensitive     GENTAMICIN <=0.5 SENSITIVE Sensitive     OXACILLIN 0.5 SENSITIVE Sensitive     TETRACYCLINE <=1 SENSITIVE Sensitive     VANCOMYCIN 1 SENSITIVE Sensitive     TRIMETH/SULFA <=10 SENSITIVE Sensitive     CLINDAMYCIN <=0.25 SENSITIVE Sensitive     RIFAMPIN <=0.5 SENSITIVE Sensitive     Inducible Clindamycin NEGATIVE Sensitive     * STAPHYLOCOCCUS AUREUS  Blood culture (routine x 2)     Status: None   Collection Time: 09/03/15 12:11 AM  Result Value Ref Range Status   Specimen Description BLOOD LEFT HAND  Final   Special Requests BOTTLES DRAWN AEROBIC AND ANAEROBIC 5CC  Final   Culture  Setup Time   Final    GRAM POSITIVE COCCI IN CLUSTERS IN BOTH AEROBIC AND ANAEROBIC BOTTLES CRITICAL RESULT CALLED TO, READ BACK BY AND VERIFIED WITH: S HOFFLER,RN AT 1426 09/03/15 BY L BENFIELD    Culture   Final    STAPHYLOCOCCUS AUREUS SUSCEPTIBILITIES PERFORMED ON PREVIOUS CULTURE WITHIN THE LAST 5 DAYS. Performed at [facility]    Report Status 09/05/2015 FINAL  Final  Urine culture     Status: None   Collection Time: 09/03/15 12:24 AM  Result Value Ref Range Status   Specimen Description URINE, CATHETERIZED  Final   Special Requests NONE  Final   Culture   Final    >=100,000 COLONIES/mL STAPHYLOCOCCUS AUREUS Performed at [facility]    Report Status 09/05/2015 FINAL  Final   Organism ID, Bacteria STAPHYLOCOCCUS AUREUS  Final      Susceptibility   Staphylococcus aureus - MIC*    CIPROFLOXACIN <=0.5 SENSITIVE Sensitive     GENTAMICIN <=0.5 SENSITIVE Sensitive     NITROFURANTOIN <=16 SENSITIVE Sensitive  OXACILLIN 0.5 SENSITIVE Sensitive     TETRACYCLINE <=1  SENSITIVE Sensitive     VANCOMYCIN <=0.5 SENSITIVE Sensitive     TRIMETH/SULFA <=10 SENSITIVE Sensitive     CLINDAMYCIN <=0.25 SENSITIVE Sensitive     RIFAMPIN <=0.5 SENSITIVE Sensitive     Inducible Clindamycin NEGATIVE Sensitive     * >=100,000 COLONIES/mL STAPHYLOCOCCUS AUREUS  MRSA PCR Screening     Status: None   Collection Time: 09/03/15  3:53 AM  Result Value Ref Range Status   MRSA by PCR NEGATIVE NEGATIVE Final    Comment:        The GeneXpert MRSA Assay (FDA approved for NASAL specimens only), is one component of a comprehensive MRSA colonization surveillance program. It is not intended to diagnose MRSA infection nor to guide or monitor treatment for MRSA infections.   Culture, blood (routine x 2)     Status: None (Preliminary result)   Collection Time: 09/05/15  6:25 PM  Result Value Ref Range Status   Specimen Description BLOOD RIGHT HAND  10 ML IN AEROBIC ONLY  Final   Special Requests Immunocompromised  Final   Culture   Final    NO GROWTH 3 DAYS Performed at [facility]    Report Status PENDING  Incomplete  C difficile quick scan w PCR reflex     Status: None   Collection Time: 09/08/15 10:50 AM  Result Value Ref Range Status   C Diff antigen NEGATIVE NEGATIVE Final   C Diff toxin NEGATIVE NEGATIVE Final   C Diff interpretation Negative for toxigenic C. 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 - Would follow with repeat chest wall echo in one year.   Bensimhon, Daniel,MD  ASSESSMENT: He has uncomplicated staph aureus bacteremia without evidence of endocarditis by TEE. He will need 14 days of IV cefazolin starting from the time of his first negative blood culture on 09/05/2015.  PLAN: 1. Continue cefazolin through 09/19/2015 2. Awaiting PICC placement  3. I will sign off now but he can follow-up with [place]  as needed if there are other infectious disease problems  Michel Bickers, MD [facility] [place] [phone] pager   [phone] cell 09/09/2015, 3:59 PM

## 2015-09-09 NOTE — Progress Notes (Signed)
CSW continuing to follow.   CSW visited pt bedside and pt sleeping soundly at this time. Per RN, pt went to [facility] today for TEE and had sedation for procedure.  CSW left SNF bed offers at bedside.  CSW to follow up with pt tomorrow regarding decision for SNF.  CSW to continue to follow.  Alison Murray, MSW, [facility] [phone]

## 2015-09-09 NOTE — Progress Notes (Signed)
  Patient is schedule for a TEE today for bacteremia. I explained the purpose as well as procedural details and potential risk. All questions and concerns were addressed. Patient gave verbal consent. RN will obtain written consent.   Nicolette Gieske

## 2015-09-09 NOTE — H&P (View-Only) (Signed)
[facility] PROGRESS NOTE  Assessment/Plan: Staphylococcus aureus bacteremia: - She was initially started on IV vancomycin and Zosyn. - She had to order 2 blood cultures and urine cultures that showed emesis today so her antibiotic coverage was the escalated to cefazolin, ID was consulted and recommended a TEE. Blood cultures on 09/05/2015 have remained negative till date, place a PICC line.  Diarrhea: C. difficile negative, will start fluoroscopy store and Lomotil.  Right lower extremity DVT (deep venous thrombosis) - He was started on IV heparin and Coumadin.  Rhabdomyolysis  On admission his CK was 3300 he was started on IV fluid and does resolve.  Elevated troponins: Likely due to rhabdo.  Alcoholic cirrhosis of liver without ascites/transaminitis Likely due to alcohol abuse. Check an abdominal ultrasound.  Mild hyponatremia: Initially likely due to decreased intravascular volume KVO IV fluids.  Alcohol abuse: No signs of withdrawal  Thrombocytopenia: Platelets are slowly improving.  Leukopenia: No resolved.     Code Status: full Family Communication: none at bedside  Disposition Plan: will need to go to SNF   Consultants:  ID  Procedures:  MRI brain  CT chest  [place] abd  Echo  Left femur  Antibiotics:  Cefazolin  HPI/Subjective: Does not remember why he is here. No chest or SOB  Objective: Filed Vitals:   09/08/15 0414 09/08/15 1314 09/08/15 2025 09/09/15 0355  BP: 154/63 142/63 148/60 120/54  Pulse: 86 93 89 83  Temp: 98.2 F (36.8 C) 98.7 F (37.1 C) 97.8 F (36.6 C) 99.7 F (37.6 C)  TempSrc: Oral Oral Oral Oral  Resp: 18 18 18 18   Height:      Weight:      SpO2: 96% 97% 98% 95%    Intake/Output Summary (Last 24 hours) at 09/09/15 0956 Last data filed at 09/09/15 0852  Gross per 24 hour  Intake 2464.28 ml  Output    500 ml  Net 1964.28 ml   Filed Weights   09/02/15 2155 09/03/15 0336 09/05/15 0400  Weight:  90.719 kg (200 lb) 96.8 kg (213 lb 6.5 oz) 98.7 kg (217 lb 9.5 oz)    Exam:  General: Alert, awake, oriented x3, in no acute distress.  HEENT: No bruits, no goiter.  Heart: Regular rate and rhythm,. Lungs: Good air movement, clear Abdomen: Soft, nontender, nondistended, positive bowel sounds.  Neuro: Grossly intact, nonfocal.   Data Reviewed: Basic Metabolic Panel:  Recent Labs Lab 09/05/15 0410 09/06/15 0435 09/07/15 1020 09/08/15 1115 09/09/15 0540  NA 137 137 133* 131* 131*  K 2.3* 3.1* 3.2* 3.0* 3.4*  CL 113* 106 105 102 102  CO2 18* 21* 22 22 22   GLUCOSE 122* 143* 190* 192* 132*  BUN 12 14 10 8 6   CREATININE <0.30* 0.39* 0.39* 0.41* 0.44*  CALCIUM 6.0* 7.9* 7.6* 7.5* 7.7*   Liver Function Tests:  Recent Labs Lab 09/05/15 0410 09/06/15 0435 09/07/15 1020 09/08/15 1115 09/09/15 0540  AST 68* 72* 98* 69* 56*  ALT 43 45 40 27 20  ALKPHOS 31* 53 70 57 56  BILITOT 5.7* 3.8* 3.4* 3.3* 3.7*  PROT 4.3* 6.1* 6.2* 6.1* 6.0*  ALBUMIN 1.5* 2.1* 2.0* 1.9* 1.7*   No results for input(s): LIPASE, AMYLASE in the last 168 hours.  Recent Labs Lab 09/03/15 0620  AMMONIA 26   CBC:  Recent Labs Lab 09/03/15 0011  09/05/15 0410 09/06/15 0435 09/07/15 1020 09/08/15 1115 09/09/15 0540  WBC 5.3  < > 1.9* 2.7* 3.5* 4.1 4.2  NEUTROABS  4.3  --   --   --   --   --   --   HGB 14.8  < > 10.2* 12.4* 12.1* 11.7* 11.1*  HCT 41.8  < > 29.5* 36.2* 35.6* 34.7* 32.9*  MCV 85.0  < > 87.8 87.9 89.2 88.7 88.9  PLT 46*  < > 41* 75* 98* 104* 115*  < > = values in this interval not displayed. Cardiac Enzymes:  Recent Labs Lab 09/03/15 0011 09/03/15 0620 09/03/15 1300 09/03/15 1820 09/04/15 0357  CKTOTAL 3381* 2552*  --   --  811*  TROPONINI 1.26* 0.84* 0.59* 0.49*  --    BNP (last 3 results) No results for input(s): BNP in the last 8760 hours.  ProBNP (last 3 results) No results for input(s): PROBNP in the last 8760 hours.  CBG:  Recent Labs Lab 09/04/15 0733  09/05/15 0813 09/07/15 0729 09/08/15 0728 09/09/15 0729  GLUCAP 176* 139* 162* 141* 126*    Recent Results (from the past 240 hour(s))  Blood culture (routine x 2)     Status: None   Collection Time: 09/03/15 12:11 AM  Result Value Ref Range Status   Specimen Description BLOOD RIGHT HAND  Final   Special Requests BOTTLES DRAWN AEROBIC AND ANAEROBIC 5CC  Final   Culture  Setup Time   Final    GRAM POSITIVE COCCI IN CLUSTERS IN BOTH AEROBIC AND ANAEROBIC BOTTLES CRITICAL RESULT CALLED TO, READ BACK BY AND VERIFIED WITH: S HOFFLER,RN AT 1426 09/03/15 BY L BENFIELD    Culture   Final    STAPHYLOCOCCUS AUREUS Performed at [facility]    Report Status 09/05/2015 FINAL  Final   Organism ID, Bacteria STAPHYLOCOCCUS AUREUS  Final      Susceptibility   Staphylococcus aureus - MIC*    CIPROFLOXACIN <=0.5 SENSITIVE Sensitive     ERYTHROMYCIN <=0.25 SENSITIVE Sensitive     GENTAMICIN <=0.5 SENSITIVE Sensitive     OXACILLIN 0.5 SENSITIVE Sensitive     TETRACYCLINE <=1 SENSITIVE Sensitive     VANCOMYCIN 1 SENSITIVE Sensitive     TRIMETH/SULFA <=10 SENSITIVE Sensitive     CLINDAMYCIN <=0.25 SENSITIVE Sensitive     RIFAMPIN <=0.5 SENSITIVE Sensitive     Inducible Clindamycin NEGATIVE Sensitive     * STAPHYLOCOCCUS AUREUS  Blood culture (routine x 2)     Status: None   Collection Time: 09/03/15 12:11 AM  Result Value Ref Range Status   Specimen Description BLOOD LEFT HAND  Final   Special Requests BOTTLES DRAWN AEROBIC AND ANAEROBIC 5CC  Final   Culture  Setup Time   Final    GRAM POSITIVE COCCI IN CLUSTERS IN BOTH AEROBIC AND ANAEROBIC BOTTLES CRITICAL RESULT CALLED TO, READ BACK BY AND VERIFIED WITH: S HOFFLER,RN AT 1426 09/03/15 BY L BENFIELD    Culture   Final    STAPHYLOCOCCUS AUREUS SUSCEPTIBILITIES PERFORMED ON PREVIOUS CULTURE WITHIN THE LAST 5 DAYS. Performed at [facility]    Report Status 09/05/2015 FINAL  Final  Urine culture     Status: None   Collection  Time: 09/03/15 12:24 AM  Result Value Ref Range Status   Specimen Description URINE, CATHETERIZED  Final   Special Requests NONE  Final   Culture   Final    >=100,000 COLONIES/mL STAPHYLOCOCCUS AUREUS Performed at [facility]    Report Status 09/05/2015 FINAL  Final   Organism ID, Bacteria STAPHYLOCOCCUS AUREUS  Final      Susceptibility   Staphylococcus  aureus - MIC*    CIPROFLOXACIN <=0.5 SENSITIVE Sensitive     GENTAMICIN <=0.5 SENSITIVE Sensitive     NITROFURANTOIN <=16 SENSITIVE Sensitive     OXACILLIN 0.5 SENSITIVE Sensitive     TETRACYCLINE <=1 SENSITIVE Sensitive     VANCOMYCIN <=0.5 SENSITIVE Sensitive     TRIMETH/SULFA <=10 SENSITIVE Sensitive     CLINDAMYCIN <=0.25 SENSITIVE Sensitive     RIFAMPIN <=0.5 SENSITIVE Sensitive     Inducible Clindamycin NEGATIVE Sensitive     * >=100,000 COLONIES/mL STAPHYLOCOCCUS AUREUS  MRSA PCR Screening     Status: None   Collection Time: 09/03/15  3:53 AM  Result Value Ref Range Status   MRSA by PCR NEGATIVE NEGATIVE Final    Comment:        The GeneXpert MRSA Assay (FDA approved for NASAL specimens only), is one component of a comprehensive MRSA colonization surveillance program. It is not intended to diagnose MRSA infection nor to guide or monitor treatment for MRSA infections.   Culture, blood (routine x 2)     Status: None (Preliminary result)   Collection Time: 09/05/15  6:25 PM  Result Value Ref Range Status   Specimen Description BLOOD RIGHT HAND  10 ML IN AEROBIC ONLY  Final   Special Requests Immunocompromised  Final   Culture   Final    NO GROWTH 3 DAYS Performed at [facility]    Report Status PENDING  Incomplete  C difficile quick scan w PCR reflex     Status: None   Collection Time: 09/08/15 10:50 AM  Result Value Ref Range Status   C Diff antigen NEGATIVE NEGATIVE Final   C Diff toxin NEGATIVE NEGATIVE Final   C Diff interpretation Negative for toxigenic C. difficile  Final      Studies: No results found.  Scheduled Meds: . atorvastatin  40 mg Oral q1800  .  ceFAZolin (ANCEF) IV  2 g Intravenous 3 times per day  . coumadin book   Does not apply Once  . enoxaparin (LOVENOX) injection  1 mg/kg Subcutaneous Q12H  . famotidine (PEPCID) IV  20 mg Intravenous Q12H  . folic acid  1 mg Oral Daily  . gabapentin  600 mg Oral TID  . Influenza vac split quadrivalent PF  0.5 mL Intramuscular Tomorrow-1000  . multivitamin with minerals  1 tablet Oral Daily  . pneumococcal 23 valent vaccine  0.5 mL Intramuscular Tomorrow-1000  . sodium chloride  3 mL Intravenous Q12H  . thiamine  100 mg Oral Daily   Or  . thiamine  100 mg Intravenous Daily  . warfarin   Does not apply Once  . Warfarin - Pharmacist Dosing Inpatient   Does not apply q1800   Continuous Infusions: . sodium chloride 75 mL/hr at 09/08/15 1500  . sodium chloride      Time Spent: 25 min   Charlynne Cousins  [facility] Pager [phone]. If 7PM-7AM, please contact night-coverage at [email or web address], password [facility] 09/09/2015, 9:56 AM  LOS: 6 days

## 2015-09-09 NOTE — Progress Notes (Signed)
[facility] PROGRESS NOTE  Assessment/Plan: Staphylococcus aureus bacteremia: - She was initially started on IV vancomycin and Zosyn. - She had to order 2 blood cultures and urine cultures that showed emesis today so her antibiotic coverage was the escalated to cefazolin, ID was consulted and recommended a TEE. Blood cultures on 09/05/2015 have remained negative till date, place a PICC line.  Diarrhea: C. difficile negative, will start fluoroscopy store and Lomotil.  Right lower extremity DVT (deep venous thrombosis) - He was started on IV heparin and Coumadin.  Rhabdomyolysis  On admission his CK was 3300 he was started on IV fluid and does resolve.  Elevated troponins: Likely due to rhabdo.  Alcoholic cirrhosis of liver without ascites/transaminitis Likely due to alcohol abuse. Check an abdominal ultrasound.  Mild hyponatremia: Initially likely due to decreased intravascular volume KVO IV fluids.  Alcohol abuse: No signs of withdrawal  Thrombocytopenia: Platelets are slowly improving.  Leukopenia: No resolved.     Code Status: full Family Communication: none at bedside  Disposition Plan: will need to go to SNF   Consultants:  ID  Procedures:  MRI brain  CT chest  [place] abd  Echo  Left femur  Antibiotics:  Cefazolin  HPI/Subjective: Does not remember why he is here. No chest or SOB  Objective: Filed Vitals:   09/08/15 0414 09/08/15 1314 09/08/15 2025 09/09/15 0355  BP: 154/63 142/63 148/60 120/54  Pulse: 86 93 89 83  Temp: 98.2 F (36.8 C) 98.7 F (37.1 C) 97.8 F (36.6 C) 99.7 F (37.6 C)  TempSrc: Oral Oral Oral Oral  Resp: 18 18 18 18   Height:      Weight:      SpO2: 96% 97% 98% 95%    Intake/Output Summary (Last 24 hours) at 09/09/15 0956 Last data filed at 09/09/15 0852  Gross per 24 hour  Intake 2464.28 ml  Output    500 ml  Net 1964.28 ml   Filed Weights   09/02/15 2155 09/03/15 0336 09/05/15 0400  Weight:  90.719 kg (200 lb) 96.8 kg (213 lb 6.5 oz) 98.7 kg (217 lb 9.5 oz)    Exam:  General: Alert, awake, oriented x3, in no acute distress.  HEENT: No bruits, no goiter.  Heart: Regular rate and rhythm,. Lungs: Good air movement, clear Abdomen: Soft, nontender, nondistended, positive bowel sounds.  Neuro: Grossly intact, nonfocal.   Data Reviewed: Basic Metabolic Panel:  Recent Labs Lab 09/05/15 0410 09/06/15 0435 09/07/15 1020 09/08/15 1115 09/09/15 0540  NA 137 137 133* 131* 131*  K 2.3* 3.1* 3.2* 3.0* 3.4*  CL 113* 106 105 102 102  CO2 18* 21* 22 22 22   GLUCOSE 122* 143* 190* 192* 132*  BUN 12 14 10 8 6   CREATININE <0.30* 0.39* 0.39* 0.41* 0.44*  CALCIUM 6.0* 7.9* 7.6* 7.5* 7.7*   Liver Function Tests:  Recent Labs Lab 09/05/15 0410 09/06/15 0435 09/07/15 1020 09/08/15 1115 09/09/15 0540  AST 68* 72* 98* 69* 56*  ALT 43 45 40 27 20  ALKPHOS 31* 53 70 57 56  BILITOT 5.7* 3.8* 3.4* 3.3* 3.7*  PROT 4.3* 6.1* 6.2* 6.1* 6.0*  ALBUMIN 1.5* 2.1* 2.0* 1.9* 1.7*   No results for input(s): LIPASE, AMYLASE in the last 168 hours.  Recent Labs Lab 09/03/15 0620  AMMONIA 26   CBC:  Recent Labs Lab 09/03/15 0011  09/05/15 0410 09/06/15 0435 09/07/15 1020 09/08/15 1115 09/09/15 0540  WBC 5.3  < > 1.9* 2.7* 3.5* 4.1 4.2  NEUTROABS  4.3  --   --   --   --   --   --   HGB 14.8  < > 10.2* 12.4* 12.1* 11.7* 11.1*  HCT 41.8  < > 29.5* 36.2* 35.6* 34.7* 32.9*  MCV 85.0  < > 87.8 87.9 89.2 88.7 88.9  PLT 46*  < > 41* 75* 98* 104* 115*  < > = values in this interval not displayed. Cardiac Enzymes:  Recent Labs Lab 09/03/15 0011 09/03/15 0620 09/03/15 1300 09/03/15 1820 09/04/15 0357  CKTOTAL 3381* 2552*  --   --  811*  TROPONINI 1.26* 0.84* 0.59* 0.49*  --    BNP (last 3 results) No results for input(s): BNP in the last 8760 hours.  ProBNP (last 3 results) No results for input(s): PROBNP in the last 8760 hours.  CBG:  Recent Labs Lab 09/04/15 0733  09/05/15 0813 09/07/15 0729 09/08/15 0728 09/09/15 0729  GLUCAP 176* 139* 162* 141* 126*    Recent Results (from the past 240 hour(s))  Blood culture (routine x 2)     Status: None   Collection Time: 09/03/15 12:11 AM  Result Value Ref Range Status   Specimen Description BLOOD RIGHT HAND  Final   Special Requests BOTTLES DRAWN AEROBIC AND ANAEROBIC 5CC  Final   Culture  Setup Time   Final    GRAM POSITIVE COCCI IN CLUSTERS IN BOTH AEROBIC AND ANAEROBIC BOTTLES CRITICAL RESULT CALLED TO, READ BACK BY AND VERIFIED WITH: S HOFFLER,RN AT 1426 09/03/15 BY L BENFIELD    Culture   Final    STAPHYLOCOCCUS AUREUS Performed at [facility]    Report Status 09/05/2015 FINAL  Final   Organism ID, Bacteria STAPHYLOCOCCUS AUREUS  Final      Susceptibility   Staphylococcus aureus - MIC*    CIPROFLOXACIN <=0.5 SENSITIVE Sensitive     ERYTHROMYCIN <=0.25 SENSITIVE Sensitive     GENTAMICIN <=0.5 SENSITIVE Sensitive     OXACILLIN 0.5 SENSITIVE Sensitive     TETRACYCLINE <=1 SENSITIVE Sensitive     VANCOMYCIN 1 SENSITIVE Sensitive     TRIMETH/SULFA <=10 SENSITIVE Sensitive     CLINDAMYCIN <=0.25 SENSITIVE Sensitive     RIFAMPIN <=0.5 SENSITIVE Sensitive     Inducible Clindamycin NEGATIVE Sensitive     * STAPHYLOCOCCUS AUREUS  Blood culture (routine x 2)     Status: None   Collection Time: 09/03/15 12:11 AM  Result Value Ref Range Status   Specimen Description BLOOD LEFT HAND  Final   Special Requests BOTTLES DRAWN AEROBIC AND ANAEROBIC 5CC  Final   Culture  Setup Time   Final    GRAM POSITIVE COCCI IN CLUSTERS IN BOTH AEROBIC AND ANAEROBIC BOTTLES CRITICAL RESULT CALLED TO, READ BACK BY AND VERIFIED WITH: S HOFFLER,RN AT 1426 09/03/15 BY L BENFIELD    Culture   Final    STAPHYLOCOCCUS AUREUS SUSCEPTIBILITIES PERFORMED ON PREVIOUS CULTURE WITHIN THE LAST 5 DAYS. Performed at [facility]    Report Status 09/05/2015 FINAL  Final  Urine culture     Status: None   Collection  Time: 09/03/15 12:24 AM  Result Value Ref Range Status   Specimen Description URINE, CATHETERIZED  Final   Special Requests NONE  Final   Culture   Final    >=100,000 COLONIES/mL STAPHYLOCOCCUS AUREUS Performed at [facility]    Report Status 09/05/2015 FINAL  Final   Organism ID, Bacteria STAPHYLOCOCCUS AUREUS  Final      Susceptibility   Staphylococcus  aureus - MIC*    CIPROFLOXACIN <=0.5 SENSITIVE Sensitive     GENTAMICIN <=0.5 SENSITIVE Sensitive     NITROFURANTOIN <=16 SENSITIVE Sensitive     OXACILLIN 0.5 SENSITIVE Sensitive     TETRACYCLINE <=1 SENSITIVE Sensitive     VANCOMYCIN <=0.5 SENSITIVE Sensitive     TRIMETH/SULFA <=10 SENSITIVE Sensitive     CLINDAMYCIN <=0.25 SENSITIVE Sensitive     RIFAMPIN <=0.5 SENSITIVE Sensitive     Inducible Clindamycin NEGATIVE Sensitive     * >=100,000 COLONIES/mL STAPHYLOCOCCUS AUREUS  MRSA PCR Screening     Status: None   Collection Time: 09/03/15  3:53 AM  Result Value Ref Range Status   MRSA by PCR NEGATIVE NEGATIVE Final    Comment:        The GeneXpert MRSA Assay (FDA approved for NASAL specimens only), is one component of a comprehensive MRSA colonization surveillance program. It is not intended to diagnose MRSA infection nor to guide or monitor treatment for MRSA infections.   Culture, blood (routine x 2)     Status: None (Preliminary result)   Collection Time: 09/05/15  6:25 PM  Result Value Ref Range Status   Specimen Description BLOOD RIGHT HAND  10 ML IN AEROBIC ONLY  Final   Special Requests Immunocompromised  Final   Culture   Final    NO GROWTH 3 DAYS Performed at [facility]    Report Status PENDING  Incomplete  C difficile quick scan w PCR reflex     Status: None   Collection Time: 09/08/15 10:50 AM  Result Value Ref Range Status   C Diff antigen NEGATIVE NEGATIVE Final   C Diff toxin NEGATIVE NEGATIVE Final   C Diff interpretation Negative for toxigenic C. difficile  Final      Studies: No results found.  Scheduled Meds: . atorvastatin  40 mg Oral q1800  .  ceFAZolin (ANCEF) IV  2 g Intravenous 3 times per day  . coumadin book   Does not apply Once  . enoxaparin (LOVENOX) injection  1 mg/kg Subcutaneous Q12H  . famotidine (PEPCID) IV  20 mg Intravenous Q12H  . folic acid  1 mg Oral Daily  . gabapentin  600 mg Oral TID  . Influenza vac split quadrivalent PF  0.5 mL Intramuscular Tomorrow-1000  . multivitamin with minerals  1 tablet Oral Daily  . pneumococcal 23 valent vaccine  0.5 mL Intramuscular Tomorrow-1000  . sodium chloride  3 mL Intravenous Q12H  . thiamine  100 mg Oral Daily   Or  . thiamine  100 mg Intravenous Daily  . warfarin   Does not apply Once  . Warfarin - Pharmacist Dosing Inpatient   Does not apply q1800   Continuous Infusions: . sodium chloride 75 mL/hr at 09/08/15 1500  . sodium chloride      Time Spent: 25 min   Charlynne Cousins  [facility] Pager [phone]. If 7PM-7AM, please contact night-coverage at [email or web address], password [facility] 09/09/2015, 9:56 AM  LOS: 6 days

## 2015-09-09 NOTE — Progress Notes (Signed)
ANTICOAGULATION CONSULT NOTE - Initial Consult  Pharmacy Consult for Heparin IV >> Lovenox/warfarin Indication: new DVT  Allergies  Allergen Reactions  . Aspirin Other (See Comments)    Ulcers.    Patient Measurements: Height: 5\' 9"  (175.3 cm) Weight: 217 lb 9.5 oz (98.7 kg) IBW/kg (Calculated) : 70.7  Vital Signs: Temp: 99.7 F (37.6 C) (09/14 0355) Temp Source: Oral (09/14 0355) BP: 120/54 mmHg (09/14 0355) Pulse Rate: 83 (09/14 0355)  Labs:  Recent Labs  09/07/15 1020 09/07/15 1744 09/08/15 0158 09/08/15 1115 09/09/15 0540  HGB 12.1*  --   --  11.7* 11.1*  HCT 35.6*  --   --  34.7* 32.9*  PLT 98*  --   --  104* 115*  LABPROT  --   --   --   --  19.3*  INR  --   --   --   --  1.62*  HEPARINUNFRC  --  <0.10* 0.13* <0.10*  --   CREATININE 0.39*  --   --  0.41* 0.44*    Estimated Creatinine Clearance: 106.6 mL/min (by C-G formula based on Cr of 0.44).   Medical History: Past Medical History  Diagnosis Date  . Lumbar disc disorder   . Anemia   . Neuropathy of lower extremity 2007    very poor balance  . Glaucoma (increased eye pressure)     bilateral  . Gastric ulcer   . Chronic back pain   . Alcohol abuse   . 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 Gastritis   . GI bleeding   . Seizures     unknown type  . Arthritis     back & neck    Medications:  Scheduled:  . atorvastatin  40 mg Oral q1800  .  ceFAZolin (ANCEF) IV  2 g Intravenous 3 times per day  . coumadin book   Does not apply Once  . enoxaparin (LOVENOX) injection  1 mg/kg Subcutaneous Q12H  . famotidine (PEPCID) IV  20 mg Intravenous Q12H  . folic acid  1 mg Oral Daily  . gabapentin  600 mg Oral TID  . Influenza vac split quadrivalent PF  0.5 mL Intramuscular Tomorrow-1000  . multivitamin with minerals  1 tablet Oral Daily  . pneumococcal 23 valent vaccine  0.5 mL Intramuscular Tomorrow-1000  . potassium chloride  40 mEq Oral BID  . saccharomyces boulardii  250 mg Oral BID  . sodium chloride   3 mL Intravenous Q12H  . thiamine  100 mg Oral Daily   Or  . thiamine  100 mg Intravenous Daily  . warfarin   Does not apply Once  . Warfarin - Pharmacist Dosing Inpatient   Does not apply q1800   Infusions:     Assessment: 34 yoM w/ PMH cirrhosis and thrombocytopenia d/t EtOH abuse, gastric ulcer w/ GI bleeding, presented 9/8 with syncope, polyuria, diarrhea, R leg weakness, AMS.  Found to have bacteremia, UTI, rhabodmyolysis, for which he is currently being treated.  Patient did not receive pharmacological VTE ppx d/t low platelet count.  On 9/12, venous duplex shows RLE DVT.  MD ok to proceed with IV UFH per pharmacy as platelets have significantly improved.  On 9/13, changed heparin to Lovenox and warfarin started.   Baseline INR, aPTT: wnl  Prior anticoagulation: none  Baseline Albumin < 2.5  Significant events:  9/13: heparin level dropped and RN noticed line was infiltrated, probably x 2-3 hrs.  Spoke with MD about not delaying therapeutic anticoagulation given  extent of DVT, and decision made to transition to Lovenox.  Warfarin was chosen for outpatient anticoag as patient not a good candidate for DOACs d/t liver disease, and he will need periodic monitoring anyway to watch for new thrombocytopenia or other coagulopathy.  Note that INR 1.27 on admission, so should still get some utility from INR monitoring, despite cirrhosis.    9/14 scheduled for TEE today, no issues with anticoag per Endoscopy  Today, 09/09/2015:  CBC: Hgb low but stable/improved.  Plt 42 on admit, now 115  INR still subtherapeutic but increased from baseline.  No previous INRs other than baseline 6 days ago, so cannot tell if increase has been over the course of stay, or more rapid.  Clear liquid diet, eating 100% of meals; NPO today for TEE  CrCl: > 90 ml/min  Significant drug-drug interactions: none  No bleeding issues noted  Goal of Therapy: INR 2-3 LMWH level 0.6-1 units/ml 4 hrs  post-dose Monitor platelets by anticoagulation protocol: Yes  Plan:  Continue Lovenox 100 mg SQ q12 hr  Warfarin 3 mg x 1 tonight at 1800 - will dose conservatively until INR trend becomes more clear  Daily INR, CBC at least q72 hrs while on Lovenox/warfarin  Will need to continue Lovenox bridge for at least 5 days AND until 2 consecutive therapeutic INRs.  Educate family on warfarin (SNF admission should cover Lovenox bridge)  Monitor for signs of bleeding or thrombosis.  Discussed low threshold for GIB workup, given cirrhosis and Hx GIB   Reuel Boom, PharmD Pager: [phone] 09/09/2015, 10:36 AM

## 2015-09-09 NOTE — Progress Notes (Signed)
OT Cancellation Note  Patient Details Name: Joshua Waller MRN: [id number] DOB: 1951-09-20   Cancelled Treatment:     Pt going to [facility] for TEE. Will check on pt next day  [place], Thereasa Parkin 09/09/2015, 11:17 AM

## 2015-09-09 NOTE — CV Procedure (Signed)
    TRANSESOPHAGEAL ECHOCARDIOGRAM   NAME:  Joshua Waller   MRN: [id number] DOB:  [birth year]   ADMIT DATE: 09/02/2015  INDICATIONS: Bacteremia   PROCEDURE:   Informed consent was obtained prior to the procedure. The risks, benefits and alternatives for the procedure were discussed and the patient comprehended these risks.  Risks include, but are not limited to, cough, sore throat, vomiting, nausea, somnolence, esophageal and stomach trauma or perforation, bleeding, low blood pressure, aspiration, pneumonia, infection, trauma to the teeth and death.    After a procedural time-out, the patient was given 4 mg versed and 50 mcg fentanyl for moderate sedation.  The oropharynx was anesthetized with topical cetacaine spray.  The transesophageal probe was inserted in the esophagus and stomach without difficulty and multiple views were obtained.    COMPLICATIONS:    There were no immediate complications.  FINDINGS:  LEFT VENTRICLE: Vigorous. EF = 65%. No regional wall motion abnormalities.  RIGHT VENTRICLE: Normal size and function.   LEFT ATRIUM: There is a small to moderate-sized (1.1 x 1.3 cm)  round well circumscribed lesion attached by a stalk to the intra-atrial septum. 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   LEFT ATRIAL APPENDAGE: Not visulaized  RIGHT ATRIUM: Normal  AORTIC VALVE:  Trileaflet. Mildly calcified. No AI/AS. No vegetation  MITRAL VALVE:    Normal. Trivial MR. No vegetation.   TRICUSPID VALVE: Not well visualized. Trivial TR. No apparent vegetation.   PULMONIC VALVE: Normal. No PR. No vegetation   INTERATRIAL SEPTUM: No PFO or ASD. 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  - Would follow with repeat chest wall echo in one year.   [name],MD 1:01  PM

## 2015-09-09 NOTE — Progress Notes (Signed)
  Echocardiogram Echocardiogram Transesophageal has been performed.  Joshua Waller 09/09/2015, 1:06 PM

## 2015-09-10 ENCOUNTER — Inpatient Hospital Stay (HOSPITAL_COMMUNITY): Payer: Medicare Other

## 2015-09-10 ENCOUNTER — Encounter (HOSPITAL_COMMUNITY): Payer: Self-pay | Admitting: Internal Medicine

## 2015-09-10 DIAGNOSIS — I82401 Acute embolism and thrombosis of unspecified deep veins of right lower extremity: Secondary | ICD-10-CM

## 2015-09-10 DIAGNOSIS — A4101 Sepsis due to Methicillin susceptible Staphylococcus aureus: Secondary | ICD-10-CM | POA: Diagnosis not present

## 2015-09-10 DIAGNOSIS — A412 Sepsis due to unspecified staphylococcus: Secondary | ICD-10-CM

## 2015-09-10 LAB — GLUCOSE, CAPILLARY: GLUCOSE-CAPILLARY: 110 mg/dL — AB (ref 65–99)

## 2015-09-10 LAB — BASIC METABOLIC PANEL
ANION GAP: 6 (ref 5–15)
BUN: 7 mg/dL (ref 6–20)
CALCIUM: 7.8 mg/dL — AB (ref 8.9–10.3)
CHLORIDE: 105 mmol/L (ref 101–111)
CO2: 22 mmol/L (ref 22–32)
Creatinine, Ser: 0.36 mg/dL — ABNORMAL LOW (ref 0.61–1.24)
GFR calc non Af Amer: >60 mL/min (ref >60)
Glucose, Bld: 123 mg/dL — ABNORMAL HIGH (ref 65–99)
Potassium: 3.6 mmol/L (ref 3.5–5.1)
SODIUM: 133 mmol/L — AB (ref 135–145)

## 2015-09-10 LAB — CULTURE, BLOOD (ROUTINE X 2): CULTURE: NO GROWTH

## 2015-09-10 LAB — PROTIME-INR
INR: 1.92 — ABNORMAL HIGH (ref 0.00–1.49)
Prothrombin Time: 21.9 seconds — ABNORMAL HIGH (ref 11.6–15.2)

## 2015-09-10 MED ORDER — SODIUM CHLORIDE 0.9 % IJ SOLN
10.0000 mL | INTRAMUSCULAR | Status: DC | PRN
  Administered 2015-09-10: 10 mL
  Filled 2015-09-10: qty 40

## 2015-09-10 MED ORDER — DIPHENOXYLATE-ATROPINE 2.5-0.025 MG PO TABS: 1.0000 | ORAL_TABLET | Freq: Four times a day (QID) | ORAL | Status: AC | PRN

## 2015-09-10 MED ORDER — WARFARIN SODIUM 2.5 MG PO TABS: 2.5000 mg | ORAL_TABLET | Freq: Once | ORAL | Status: AC

## 2015-09-10 MED ORDER — CEFAZOLIN SODIUM-DEXTROSE 2-3 GM-% IV SOLR: 2.0000 g | Freq: Three times a day (TID) | INTRAVENOUS | Status: AC

## 2015-09-10 MED ORDER — HEPARIN SOD (PORK) LOCK FLUSH 100 UNIT/ML IV SOLN
250.0000 [IU] | INTRAVENOUS | Status: AC | PRN
  Administered 2015-09-10: 250 [IU]

## 2015-09-10 MED ORDER — ENOXAPARIN SODIUM 150 MG/ML ~~LOC~~ SOLN: 1.0000 mg/kg | Freq: Two times a day (BID) | SUBCUTANEOUS | Status: AC

## 2015-09-10 MED ORDER — WARFARIN SODIUM 2.5 MG PO TABS: 2.5000 mg | ORAL_TABLET | Freq: Once | ORAL | Status: DC

## 2015-09-10 MED ORDER — OXYCODONE HCL 5 MG PO TABS: 5.0000 mg | ORAL_TABLET | Freq: Four times a day (QID) | ORAL | Status: AC | PRN

## 2015-09-10 MED ORDER — SACCHAROMYCES BOULARDII 250 MG PO CAPS: 250.0000 mg | ORAL_CAPSULE | Freq: Two times a day (BID) | ORAL | Status: AC

## 2015-09-10 MED ORDER — ATORVASTATIN CALCIUM 40 MG PO TABS: 40.0000 mg | ORAL_TABLET | Freq: Every day | ORAL | Status: AC

## 2015-09-10 NOTE — Progress Notes (Signed)
OT Cancellation Note  Patient Details Name: Joshua Waller MRN: [id number] DOB: 01-31-1951   Cancelled Treatment:    Reason Eval/Treat Not Completed: Other (comment) Per nursing, pt to get PICC line placed soon and pt with nursing currently. Will try back later time.  [place], [place] 09/10/2015, 10:17 AM

## 2015-09-10 NOTE — Progress Notes (Signed)
Pt d/c to [facility] at this time via [place]. Crew in possession of transfer paperwork and pt belongings. Pt stable at time of d/c

## 2015-09-10 NOTE — Progress Notes (Signed)
ANTICOAGULATION CONSULT NOTE - Initial Consult  Pharmacy Consult for Heparin IV >> Lovenox/warfarin Indication: new DVT  Allergies  Allergen Reactions  . Aspirin Other (See Comments)    Ulcers.    Patient Measurements: Height: 5\' 9"  (175.3 cm) Weight: 217 lb 9.5 oz (98.7 kg) IBW/kg (Calculated) : 70.7  Vital Signs: Temp: 97.5 F (36.4 C) (09/15 0606) Temp Source: Oral (09/15 0606) BP: 137/60 mmHg (09/15 0606) Pulse Rate: 88 (09/15 0606)  Labs:  Recent Labs  09/07/15 1020 09/07/15 1744 09/08/15 0158 09/08/15 1115 09/09/15 0540 09/10/15 0513  HGB 12.1*  --   --  11.7* 11.1*  --   HCT 35.6*  --   --  34.7* 32.9*  --   PLT 98*  --   --  104* 115*  --   LABPROT  --   --   --   --  19.3* 21.9*  INR  --   --   --   --  1.62* 1.92*  HEPARINUNFRC  --  <0.10* 0.13* <0.10*  --   --   CREATININE 0.39*  --   --  0.41* 0.44* 0.36*    Estimated Creatinine Clearance: 106.6 mL/min (by C-G formula based on Cr of 0.36).   Medical History: Past Medical History  Diagnosis Date  . Lumbar disc disorder   . Anemia   . Neuropathy of lower extremity 2007    very poor balance  . Glaucoma (increased eye pressure)     bilateral  . Gastric ulcer   . Chronic back pain   . Alcohol abuse   . 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 Gastritis   . GI bleeding   . Seizures     unknown type  . Arthritis     back & neck    Medications:  Scheduled:  . atorvastatin  40 mg Oral q1800  .  ceFAZolin (ANCEF) IV  2 g Intravenous 3 times per day  . coumadin book   Does not apply Once  . enoxaparin (LOVENOX) injection  1 mg/kg Subcutaneous Q12H  . famotidine (PEPCID) IV  20 mg Intravenous Q12H  . folic acid  1 mg Oral Daily  . gabapentin  600 mg Oral TID  . Influenza vac split quadrivalent PF  0.5 mL Intramuscular Tomorrow-1000  . multivitamin with minerals  1 tablet Oral Daily  . pneumococcal 23 valent vaccine  0.5 mL Intramuscular Tomorrow-1000  . saccharomyces boulardii  250 mg Oral BID  .  sodium chloride  3 mL Intravenous Q12H  . thiamine  100 mg Oral Daily   Or  . thiamine  100 mg Intravenous Daily  . warfarin   Does not apply Once  . Warfarin - Pharmacist Dosing Inpatient   Does not apply q1800   Infusions:     Assessment: 62 yoM w/ PMH cirrhosis and thrombocytopenia d/t EtOH abuse, gastric ulcer w/ GI bleeding, presented 9/8 with syncope, polyuria, diarrhea, R leg weakness, AMS.  Found to have bacteremia, UTI, rhabodmyolysis, for which he is currently being treated.  Patient did not receive pharmacological VTE ppx d/t low platelet count.  On 9/12, venous duplex shows RLE DVT.  MD ok to proceed with IV UFH per pharmacy as platelets have significantly improved.  On 9/13, changed heparin to Lovenox and warfarin started.   Baseline INR, aPTT: wnl  Prior anticoagulation: none  Baseline Albumin < 2.5  Significant events:  9/13: heparin level dropped and RN noticed line was infiltrated, probably x 2-3 hrs.  Spoke  with MD about not delaying therapeutic anticoagulation given extent of DVT, and decision made to transition to Lovenox.  Warfarin was chosen for outpatient anticoag as patient not a good candidate for DOACs d/t liver disease, and he will need periodic monitoring anyway to watch for new thrombocytopenia or other coagulopathy.  Note that INR 1.27 on admission, so should still get some utility from INR monitoring, despite cirrhosis.    9/14 TEE neg for endocarditis  Today, 09/10/2015:  CBC: none today; previously with plt improving; Hgb sl lower but still acceptable  INR rising steadily, slightly faster than normal as expected with    NPO yesterday for TEE  CrCl: > 90 ml/min, stable  Significant drug-drug interactions: none  No bleeding issues noted  Goal of Therapy: INR 2-3 LMWH level 0.6-1 units/ml 4 hrs post-dose Monitor platelets by anticoagulation protocol: Yes  Plan:  Continue Lovenox 100 mg SQ q12 hr  Warfarin 2.5 x 1 tonight at 1800.  I  suspect his maintenance dose will be ~1-3 mg daily  Daily INR; CBC at least q72 hrs while on Lovenox/warfarin  Will need to continue Lovenox bridge for at least 5 days AND until 2 consecutive therapeutic INRs.  Educate family on warfarin (SNF admission should cover Lovenox bridge)  Monitor for signs of bleeding or thrombosis.  Discussed low threshold for GIB workup, given cirrhosis and Hx GIB   Reuel Boom, PharmD Pager: [phone] 09/10/2015, 7:55 AM

## 2015-09-10 NOTE — Discharge Instructions (Signed)
Information on my medicine - Coumadin   (Warfarin)  This medication education was reviewed with me or my healthcare representative as part of my discharge preparation.  The pharmacist that spoke with me during my hospital stay was:  Vann Okerlund A, RPH  Why was Coumadin prescribed for you? Coumadin was prescribed for you because you have a blood clot or a medical condition that can cause an increased risk of forming blood clots. Blood clots can cause serious health problems by blocking the flow of blood to the heart, lung, or brain. Coumadin can prevent harmful blood clots from forming. As a reminder your indication for Coumadin is:   Deep Vein Thrombosis Treatment  What test will check on my response to Coumadin? While on Coumadin (warfarin) you will need to have an INR test regularly to ensure that your dose is keeping you in the desired range. The INR (international normalized ratio) number is calculated from the result of the laboratory test called prothrombin time (PT).  If an INR APPOINTMENT HAS NOT ALREADY BEEN MADE FOR YOU please schedule an appointment to have this lab work done by your health care provider within 7 days. Your INR goal is usually a number between:  2 to 3 or your provider may give you a more narrow range like 2-2.5.  Ask your health care provider during an office visit what your goal INR is.  What  do you need to  know  About  COUMADIN? Take Coumadin (warfarin) exactly as prescribed by your healthcare provider about the same time each day.  DO NOT stop taking without talking to the doctor who prescribed the medication.  Stopping without other blood clot prevention medication to take the place of Coumadin may increase your risk of developing a new clot or stroke.  Get refills before you run out.  What do you do if you miss a dose? If you miss a dose, take it as soon as you remember on the same day then continue your regularly scheduled regimen the next day.  Do not take  two doses of Coumadin at the same time.  Important Safety Information A possible side effect of Coumadin (Warfarin) is an increased risk of bleeding. You should call your healthcare provider right away if you experience any of the following: ? Bleeding from an injury or your nose that does not stop. ? Unusual colored urine (red or dark brown) or unusual colored stools (red or black). ? Unusual bruising for unknown reasons. ? A serious fall or if you hit your head (even if there is no bleeding).  Some foods or medicines interact with Coumadin (warfarin) and might alter your response to warfarin. To help avoid this: ? Eat a balanced diet, maintaining a consistent amount of Vitamin K. ? Notify your provider about major diet changes you plan to make. ? Avoid alcohol or limit your intake to 1 drink for women and 2 drinks for men per day. (1 drink is 5 oz. wine, 12 oz. beer, or 1.5 oz. liquor.)  Make sure that ANY health care provider who prescribes medication for you knows that you are taking Coumadin (warfarin).  Also make sure the healthcare provider who is monitoring your Coumadin knows when you have started a new medication including herbals and non-prescription products.  Coumadin (Warfarin)  Major Drug Interactions  Increased Warfarin Effect Decreased Warfarin Effect  Alcohol (large quantities) Antibiotics (esp. Septra/Bactrim, Flagyl, Cipro) Amiodarone (Cordarone) Aspirin (ASA) Cimetidine (Tagamet) Megestrol (Megace) NSAIDs (ibuprofen, naproxen, etc.)  Piroxicam (Feldene) °Propafenone (Rythmol SR) °Propranolol (Inderal) °Isoniazid (INH) °Posaconazole (Noxafil) Barbiturates (Phenobarbital) °Carbamazepine (Tegretol) °Chlordiazepoxide (Librium) °Cholestyramine (Questran) °Griseofulvin °Oral Contraceptives °Rifampin °Sucralfate (Carafate) °Vitamin K  ° °Coumadin® (Warfarin) Major Herbal Interactions  °Increased Warfarin Effect Decreased Warfarin Effect  °Garlic °Ginseng °Ginkgo biloba  Coenzyme Q10 °Green tea °St. John’s wort   ° °Coumadin® (Warfarin) FOOD Interactions  °Eat a consistent number of servings per week of foods HIGH in Vitamin K °(1 serving = ½ cup)  °Collards (cooked, or boiled & drained) °Kale (cooked, or boiled & drained) °Mustard greens (cooked, or boiled & drained) °Parsley *serving size only = ¼ cup °Spinach (cooked, or boiled & drained) °Swiss chard (cooked, or boiled & drained) °Turnip greens (cooked, or boiled & drained)  °Eat a consistent number of servings per week of foods MEDIUM-HIGH in Vitamin K °(1 serving = 1 cup)  °Asparagus (cooked, or boiled & drained) °Broccoli (cooked, boiled & drained, or raw & chopped) °Brussel sprouts (cooked, or boiled & drained) *serving size only = ½ cup °Lettuce, raw (green leaf, endive, romaine) °Spinach, raw °Turnip greens, raw & chopped  ° °These websites have more information on Coumadin (warfarin):  www.coumadin.com; °www.ahrq.gov/consumer/coumadin.htm; ° ° ° °

## 2015-09-10 NOTE — Consult Note (Signed)
Reason for Consult: Left proximal humerus fracture Referring Physician: Dr. Tammi Klippel for Joshua Waller is an 64 y.o. male.  HPI: Patient was admitted a few days ago to the medicine service with rhabdomyolysis probably secondary to lying on his right side for an extended period of time. During his workup he was found to have positive blood cultures, and a left proximal humerus fracture that on careful review of the old records and PACs system looks like it occurred about a year ago and has gone to heal with a slight malunion. The patient is a poor historian but reports little if any discomfort in the left upper extremity more pain in the right arm elbow and forearm region where he had been lying on for an indeterminate period of time his CPKs at admission were 3300. He is also x-rays of his femurs his chest CT scan of his chest and there are no evidence of any acute fractures. Almost talking to the patient he repeatedly said he would be getting up and leaving in the next hour to which I don't think is correct. Other diagnoses include liver cirrhosis, thrombocytopenia, splenomegaly, syncope, gastric ulcer and neuropathy.  Past Medical History  Diagnosis Date  . Lumbar disc disorder   . Anemia   . Neuropathy of lower extremity 2007    very poor balance  . Glaucoma (increased eye pressure)     bilateral  . Gastric ulcer   . Chronic back pain   . Alcohol abuse   . 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 Gastritis   . GI bleeding   . Seizures     unknown type  . Arthritis     back & neck    Past Surgical History  Procedure Laterality Date  . Esophagogastroduodenoscopy  12/29/2011    Procedure: ESOPHAGOGASTRODUODENOSCOPY (EGD);  Surgeon: Zenovia Jarred, MD;  Location: [facility] ENDOSCOPY;  Service: Gastroenterology;  Laterality: N/A;  . Tonsillectomy    . Esophagogastroduodenoscopy  12/22/2012    Procedure: ESOPHAGOGASTRODUODENOSCOPY (EGD);  Surgeon: Lear Ng, MD;  Location: [facility] ENDOSCOPY;  Service:  Endoscopy;  Laterality: N/A;  . Colonoscopy  12/23/2012    Procedure: COLONOSCOPY;  Surgeon: Lear Ng, MD;  Location: [facility] ENDOSCOPY;  Service: Endoscopy;  Laterality: N/A;  . Esophagogastroduodenoscopy N/A 09/06/2013    Procedure: ESOPHAGOGASTRODUODENOSCOPY (EGD);  Surgeon: Beryle Beams, MD;  Location: [facility] ENDOSCOPY;  Service: Endoscopy;  Laterality: N/A;  bedside  . Colonoscopy N/A 09/10/2013    Procedure: COLONOSCOPY;  Surgeon: Beryle Beams, MD;  Location: [facility] ENDOSCOPY;  Service: Endoscopy;  Laterality: N/A;  . Esophagogastroduodenoscopy N/A 11/08/2013    Procedure: ESOPHAGOGASTRODUODENOSCOPY (EGD);  Surgeon: Beryle Beams, MD;  Location: [facility] ENDOSCOPY;  Service: Endoscopy;  Laterality: N/A;  . Tee without cardioversion N/A 09/09/2015    Procedure: TRANSESOPHAGEAL ECHOCARDIOGRAM (TEE) (INPATIENT AT [place]) ;  Surgeon: Jolaine Artist, MD;  Location: [facility] ENDOSCOPY;  Service: Cardiovascular;  Laterality: N/A;    Family History  Problem Relation Age of Onset  . Heart disease Mother   . Lumbar disc disease Father     Social History:  reports that he has never smoked. He has never used smokeless tobacco. He reports that he drinks about 3.6 oz of alcohol per week. He reports that he does not use illicit drugs.  Allergies:  Allergies  Allergen Reactions  . Aspirin Other (See Comments)    Ulcers.    Medications: I have reviewed the patient's current medications.  Results for orders placed or  performed during the hospital encounter of 09/02/15 (from the past 48 hour(s))  Comprehensive metabolic panel     Status: Abnormal   Collection Time: 09/09/15  5:40 AM  Result Value Ref Range   Sodium 131 (L) 135 - 145 mmol/L   Potassium 3.4 (L) 3.5 - 5.1 mmol/L   Chloride 102 101 - 111 mmol/L   CO2 22 22 - 32 mmol/L   Glucose, Bld 132 (H) 65 - 99 mg/dL   BUN 6 6 - 20 mg/dL   Creatinine, Ser 0.44 (L) 0.61 - 1.24 mg/dL   Calcium 7.7 (L) 8.9 - 10.3 mg/dL   Total Protein 6.0 (L)  6.5 - 8.1 g/dL   Albumin 1.7 (L) 3.5 - 5.0 g/dL   AST 56 (H) 15 - 41 U/L   ALT 20 17 - 63 U/L   Alkaline Phosphatase 56 38 - 126 U/L   Total Bilirubin 3.7 (H) 0.3 - 1.2 mg/dL   GFR calc non Af Amer >60 >60 mL/min   GFR calc Af Amer >60 >60 mL/min    Comment: (NOTE) The eGFR has been calculated using the CKD EPI equation. This calculation has not been validated in all clinical situations. eGFR's persistently <60 mL/min signify possible Chronic Kidney Disease.    Anion gap 7 5 - 15  CBC     Status: Abnormal   Collection Time: 09/09/15  5:40 AM  Result Value Ref Range   WBC 4.2 4.0 - 10.5 K/uL   RBC 3.70 (L) 4.22 - 5.81 MIL/uL   Hemoglobin 11.1 (L) 13.0 - 17.0 g/dL   HCT 32.9 (L) 39.0 - 52.0 %   MCV 88.9 78.0 - 100.0 fL   MCH 30.0 26.0 - 34.0 pg   MCHC 33.7 30.0 - 36.0 g/dL   RDW 16.3 (H) 11.5 - 15.5 %   Platelets 115 (L) 150 - 400 K/uL    Comment: CONSISTENT WITH PREVIOUS RESULT  Protime-INR     Status: Abnormal   Collection Time: 09/09/15  5:40 AM  Result Value Ref Range   Prothrombin Time 19.3 (H) 11.6 - 15.2 seconds   INR 1.62 (H) 0.00 - 1.49  Glucose, capillary     Status: Abnormal   Collection Time: 09/09/15  7:29 AM  Result Value Ref Range   Glucose-Capillary 126 (H) 65 - 99 mg/dL  Protime-INR     Status: Abnormal   Collection Time: 09/10/15  5:13 AM  Result Value Ref Range   Prothrombin Time 21.9 (H) 11.6 - 15.2 seconds   INR 1.92 (H) 0.00 - 5.91  Basic metabolic panel     Status: Abnormal   Collection Time: 09/10/15  5:13 AM  Result Value Ref Range   Sodium 133 (L) 135 - 145 mmol/L   Potassium 3.6 3.5 - 5.1 mmol/L   Chloride 105 101 - 111 mmol/L   CO2 22 22 - 32 mmol/L   Glucose, Bld 123 (H) 65 - 99 mg/dL   BUN 7 6 - 20 mg/dL   Creatinine, Ser 0.36 (L) 0.61 - 1.24 mg/dL   Calcium 7.8 (L) 8.9 - 10.3 mg/dL   GFR calc non Af Amer >60 >60 mL/min   GFR calc Af Amer >60 >60 mL/min    Comment: (NOTE) The eGFR has been calculated using the CKD EPI equation. This  calculation has not been validated in all clinical situations. eGFR's persistently <60 mL/min signify possible Chronic Kidney Disease.    Anion gap 6 5 - 15  Glucose, capillary  Status: Abnormal   Collection Time: 09/10/15  7:56 AM  Result Value Ref Range   Glucose-Capillary 110 (H) 65 - 99 mg/dL   Comment 1 Notify RN    Comment 2 Document in Chart     Dg Shoulder 1v Right  09/10/2015   CLINICAL DATA:  Right lateral shoulder pain for 2 weeks since fall.  EXAM: RIGHT SHOULDER - 1 VIEW  COMPARISON:  None.  FINDINGS: There is no evidence of fracture or dislocation. There is mild degenerative change at the [facility] joint. There is no evidence of arthropathy or other focal bone abnormality. Soft tissues are unremarkable.  IMPRESSION: 1. No acute findings. 2. Mild AC joint osteoarthritis.   Electronically Signed   By: Kerby Moors M.D.   On: 09/10/2015 11:12   US Abdomen Limited  09/09/2015   CLINICAL DATA:  Cirrhosis.  Ascites.  EXAM: LIMITED ABDOMEN ULTRASOUND FOR ASCITES  TECHNIQUE: Limited ultrasound survey for ascites was performed in all four abdominal quadrants.  COMPARISON:  None.  FINDINGS: Mild ascites is seen mostly in the right upper and lower quadrants, greater than left.  IMPRESSION: Mild ascites, right side greater than left.   Electronically Signed   By: Earle Gell M.D.   On: 09/09/2015 19:59   Dg Humerus Right  09/10/2015   CLINICAL DATA:  Right lateral shoulder pain for 2 weeks.  Fall.  EXAM: RIGHT HUMERUS - 2+ VIEW  COMPARISON:  None  FINDINGS: There is no evidence of fracture or other focal bone lesions. Soft tissues are unremarkable.  IMPRESSION: Negative.   Electronically Signed   By: Kerby Moors M.D.   On: 09/10/2015 11:15    ROS patient denies any chest pain or shortness of breath today. Blood pressure 137/60, pulse 88, temperature 97.5 F (36.4 C), temperature source Oral, resp. rate 20, height 5' 9"  (1.753 m), weight 98.7 kg (217 lb 9.5 oz), SpO2 98 %. Physical  Exam There is no swelling and minimal tenderness to the left shoulder and proximal humerus where x-rays show a healed proximal humerus fracture that included the greater trochanter. On the right side starting at the mid humerus level going distally there is some diffuse swelling and bruising, probably the source of his rhabdomyolysis. He does have a range of motion of the elbow from a 20-130 full pronation and supination of his forearm and good grip strength. Grossly normal sensation to his fingers. Assessment/Plan:  Assessment: Healed left proximal humerus fracture from one year ago. Rhabdomyolysis probably related to passing out and lying on his right side for an indeterminate period of time secondary to EtOH abuse.  Plan: He may follow-up in our clinic on an as-needed basis orthopedically most of his problems are self-limited and will not require any intervention.  Sabre Romberger J 09/10/2015, 1:32 PM

## 2015-09-10 NOTE — Progress Notes (Signed)
Peripherally Inserted Central Catheter/Midline Placement  The IV Nurse has discussed with the patient and/or persons authorized to consent for the patient, the purpose of this procedure and the potential benefits and risks involved with this procedure.  The benefits include less needle sticks, lab draws from the catheter and patient may be discharged home with the catheter.  Risks include, but not limited to, infection, bleeding, blood clot (thrombus formation), and puncture of an artery; nerve damage and irregular heat beat.  Alternatives to this procedure were also discussed.  PICC/Midline Placement Documentation        Joshua Waller 09/10/2015, 11:54 AM
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
## 2015-09-10 NOTE — Progress Notes (Signed)
Report called to Carmell Austria at [place]. All questions answered, no further questions. Contact number provided. Pt aware of transfer and agreeable.

## 2015-09-10 NOTE — Clinical Social Work Placement (Signed)
   CLINICAL SOCIAL WORK PLACEMENT  NOTE  Date:  09/10/2015  Patient Details  Name: Joshua Waller MRN: [id number] Date of Birth: 17-Mar-1951  Clinical Social Work is seeking post-discharge placement for this patient at the [place] level of care (*CSW will initial, date and re-position this form in  chart as items are completed):  Yes   Patient/family provided with [place] Work Department's list of facilities offering this level of care within the geographic area requested by the patient (or if unable, by the patient's family).  Yes   Patient/family informed of their freedom to choose among providers that offer the needed level of care, that participate in Medicare, Medicaid or managed care program needed by the patient, have an available bed and are willing to accept the patient.  Yes   Patient/family informed of [place]'s ownership interest in [facility] and [facility], as well as of the fact that they are under no obligation to receive care at these facilities.  PASRR submitted to EDS on 09/08/15     PASRR number received on 09/08/15     Existing PASRR number confirmed on       FL2 transmitted to all facilities in geographic area requested by pt/family on 09/08/15     FL2 transmitted to all facilities within larger geographic area on       Patient informed that his/her managed care company has contracts with or will negotiate with certain facilities, including the following:        Yes   Patient/family informed of bed offers received.  Patient chooses bed at [place] recommends and patient chooses bed at      Patient to be transferred to [facility] on 09/10/15.  Patient to be transferred to facility by ambulance [facility])     Patient family notified on 09/10/15 of transfer.  Name of family member notified:  pt notified at bedside, pt sister, Juliann Pulse notified via telephone. Pt friend,  Louie Casa notified via telephone     PHYSICIAN Please sign FL2     Additional Comment:    _______________________________________________ Ladell Pier, LCSW 09/10/2015, 3:20 PM

## 2015-09-10 NOTE — Care Management Important Message (Signed)
Important Message  Patient Details  Name: Joshua Waller MRN: [id number] Date of Birth: 1951/01/15   Medicare Important Message Given:  Yes-third notification given    Camillo Flaming 09/10/2015, 1:11 [place] Message  Patient Details  Name: Joshua Waller MRN: [id number] Date of Birth: 05/14/1951   Medicare Important Message Given:  Yes-third notification given    Camillo Flaming 09/10/2015, 1:11 PM

## 2015-09-10 NOTE — Progress Notes (Signed)
CSW continuing to follow.  Per MD, pt medically ready for discharge today.  CSW met with pt at bedside. CSW discussed SNF bed offers with pt at bedside. Pt chooses bed at [place].   CSW contacted facility and confirmed bed availability for today.  CSW faxed pt clinicals to [facility]. Awaiting insurance authorization.  CSW to continue to follow to provide support and assist with pt disposition needs.  Alison Murray, MSW, [place] Work [phone]

## 2015-09-10 NOTE — Discharge Summary (Addendum)
Physician Discharge Summary  Joshua Waller OHY:[id number] DOB: Nov 28, 1951 DOA: 09/02/2015  PCP: Leonard Downing, MD  Admit date: 09/02/2015 Discharge date: 09/10/2015  Time spent: 35 minutes  Recommendations for Outpatient Follow-up:  1. Follow-up with primary care doctor in 2-4 weeks remove PICC line after 09/19/2015.  2. Continue IV antibiotics until 09/19/2015. 3. 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   Discharge Diagnoses:  Principal Problem:   Staphylococcus aureus bacteremia Active Problems:   Sepsis due to Staphylococcus   ETOH abuse   Neuropathy   Gastric ulcer   Weakness of right leg   Hyponatremia   Thrombocytopenia   Cirrhosis of liver   Splenomegaly   Abnormal LFTs   Mediastinal widening   Acute encephalopathy   Syncope   Rhabdomyolysis   Elevated troponin   Long QT interval   Hyperglycemia   Alcoholic cirrhosis of liver without ascites   DVT (deep venous thrombosis)   Discharge Condition: stable  Diet recommendation: low sodium fluid restriction to 2.0 L  Filed Weights   09/02/15 2155 09/03/15 0336 09/05/15 0400  Weight: 90.719 kg (200 lb) 96.8 kg (213 lb 6.5 oz) 98.7 kg (217 lb 9.5 oz)    History of present illness:  64 y.o. male with PMH of alcohol abuse, Mallory-Weiss tear, seizure, thrombocytopenia, gastric ulcer, GI bleeding, who presents with syncope, increased urinary frequency, diarrhea, right leg weakness, pain over both thighs, AMS.   Patient is a poor historian. He reports that he has increased urinary frequency recently, but no burning or dysuria. No flank pain. He has fever and chills. He also states that he had diarrhea for 4 days, which resolved yesterday. No diarrhea today. He does not have nausea, vomiting or abdominal pain. He reports that he passed out and fell on the floor at home in Sunday which lasted approximately 5 minutes. He did not have chest pain, shortness of breath, dizziness, vision change or hearing loss.  He states that he has chronic weakness in both legs, but feels that his right leg is weaker recently.  Hospital Course:  Staphylococcus aureus bacteremia: - He was initially started on IV vancomycin and Zosyn. - He had to order 2 blood cultures and urine cultures that showed MSSA,  antibiotic coverage was the escalated to cefazolin, ID was consulted and recommended a TEE which did not show vegetation. 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 Blood cultures on 09/05/2015 have remained negative till date,  PICC line placed on 9.15.2016. Will cont cefazolin until 9.24.2016. Will remove PICC line on 9.25.2016.  Diarrhea: C. difficile negative, started on florasto and Lomotil. With improvement in his diarrhea.  Right lower extremity DVT (deep venous thrombosis) - He was started on IV heparin and Coumadin. He was transitioned to Lovenox once a day and will continue Coumadin for 3 months with a goal INR 2-3.  Rhabdomyolysis  On admission his CK was 3300 he was started on IV fluid and does resolve.  Elevated troponins: Likely due to rhabdo.  Alcoholic cirrhosis of liver without ascites/transaminitis Likely due to alcohol abuse. Mild ascites, his Aldactone was increased. We'll go on a fluid restriction diet and low-sodium diet.  Mild hyponatremia: Initially likely due to decreased intravascular and secondary hyperaldosteronism due to cirrhosis. His Aldactone was increased his sodium is improving.  Alcohol abuse: No signs of withdrawal  Thrombocytopenia: Platelets are slowly improving.  Leukopenia: No resolved. Due to ETOH abuse.  Encephalopathy/acute confusional state: - Likely multifactorial from alcohol abuse, UTI, rhabdomyolysis and delirium -  MRI of the brain was done that showed Advanced brain atrophy with superimposed traumatic pattern frontal temporal encephalomalacia, possibly due to alcohol abuse Continue supportive management. Ammonia level 31.  Right  right shoulder pain: X-ray of the right OA. Follow up with ortho as an outpatient.  Procedures:  CT chest with contrast  DG of the head without contrast  Chest x-ray  Brain MRI  Abdominal ultrasound complete  2-D echo  TEE  Femur x-ray two-view left and right  Upper extremity Doppler  Consultations:  Cardiology  ID  Discharge Exam: Filed Vitals:   09/10/15 0606  BP: 137/60  Pulse: 88  Temp: 97.5 F (36.4 C)  Resp: 20    General: A&o x3 Cardiovascular: RRR Respiratory: good air movement CTA B/L  Discharge Instructions   Discharge Instructions    Diet - low sodium heart healthy    Complete by:  As directed      Increase activity slowly    Complete by:  As directed           Current Discharge Medication List    START taking these medications   Details  atorvastatin (LIPITOR) 40 MG tablet Take 1 tablet (40 mg total) by mouth daily at 6 PM. Qty: 30 tablet, Refills: 3    ceFAZolin (ANCEF) 2-3 GM-% SOLR Inject 50 mLs (2 g total) into the vein every 8 (eight) hours. Qty: 27 each, Refills: 0    diphenoxylate-atropine (LOMOTIL) 2.5-0.025 MG per tablet Take 1 tablet by mouth 4 (four) times daily as needed for diarrhea or loose stools. Qty: 30 tablet, Refills: 0    enoxaparin (LOVENOX) 150 MG/ML injection Inject 0.66 mLs (100 mg total) into the skin every 12 (twelve) hours. Qty: 2 Syringe, Refills: 0    oxyCODONE (OXY IR/ROXICODONE) 5 MG immediate release tablet Take 1 tablet (5 mg total) by mouth every 6 (six) hours as needed for severe pain. Qty: 30 tablet, Refills: 0    saccharomyces boulardii (FLORASTOR) 250 MG capsule Take 1 capsule (250 mg total) by mouth 2 (two) times daily. Qty: 30 capsule, Refills: 3    warfarin (COUMADIN) 2.5 MG tablet Take 1 tablet (2.5 mg total) by mouth one time only at 6 PM. Qty: 30 tablet, Refills: 0      CONTINUE these medications which have NOT CHANGED   Details  gabapentin (NEURONTIN) 600 MG tablet Take 600 mg  by mouth 3 (three) times daily.    traMADol (ULTRAM) 50 MG tablet Take 100 mg by mouth 2 (two) times daily.       STOP taking these medications     oxyCODONE-acetaminophen (PERCOCET/ROXICET) 5-325 MG per tablet        Allergies  Allergen Reactions  . Aspirin Other (See Comments)    Ulcers.   Follow-up Information    Follow up with Leonard Downing, MD In 3 weeks.   Specialty:  Family Medicine   Why:  hospital follow up   Contact information:   [place] [id number] [phone]       Please follow up.   Why:  Patient will need to make appointment for follow up.        The results of significant diagnostics from this hospitalization (including imaging, microbiology, ancillary and laboratory) are listed below for reference.    Significant Diagnostic Studies: Dg Chest 2 View  09/03/2015   CLINICAL DATA:  64 year old male with fall and fever.  EXAM: CHEST  2 VIEW  COMPARISON:  Chest radiograph  dated 09/25/2013 all  FINDINGS: There is widening of the cardiomediastinal silhouette, significantly increased from prior study. This may be partially related to shallow inspiratory effort and imaging technique. Underlying Traumatic aortic or mediastinal injury is not excluded. Clinical correlation recommended. CT with contrast may provide better evaluation there is clinical concern for traumatic aortic or mediastinal injury. There are bibasilar linear atelectatic changes. There is no focal consolidation, pleural effusion, or pneumothorax. There are degenerative changes of the shoulders. No acute fracture.  IMPRESSION: Enlarged cardiomediastinal silhouette. Underlying traumatic mediastinal injury is not excluded. Clinical correlation is recommended. CT of the chest may provide better evaluation if clinically indicated.  No focal consolidation.  These results were called by telephone at the time of interpretation on 09/03/2015 at 1:09 am to Dr. Gareth Morgan , who verbally  acknowledged these results.   Electronically Signed   By: Anner Crete M.D.   On: 09/03/2015 01:09   Dg Shoulder 1v Right  09/10/2015   CLINICAL DATA:  Right lateral shoulder pain for 2 weeks since fall.  EXAM: RIGHT SHOULDER - 1 VIEW  COMPARISON:  None.  FINDINGS: There is no evidence of fracture or dislocation. There is mild degenerative change at the [facility] joint. There is no evidence of arthropathy or other focal bone abnormality. Soft tissues are unremarkable.  IMPRESSION: 1. No acute findings. 2. Mild AC joint osteoarthritis.   Electronically Signed   By: Kerby Moors M.D.   On: 09/10/2015 11:12   Ct Head Wo Contrast  09/03/2015   CLINICAL DATA:  64 year old male with fall.  EXAM: CT HEAD WITHOUT CONTRAST  TECHNIQUE: Contiguous axial images were obtained from the base of the skull through the vertex without intravenous contrast.  COMPARISON:  CT dated 03/01/2015  FINDINGS: There is stable dilatation of the ventricles out of proportion with the sulci which may represent central volume loss versus normal pressure hydrocephalus. Clinical correlation is recommended. Periventricular and deep white matter hypodensities represent chronic microvascular ischemic changes. Stable areas of bifrontal old infarct and encephalomalacia changes noted. There is no intracranial hemorrhage. No mass effect or midline shift identified.  The visualized paranasal sinuses and mastoid air cells are well aerated. The calvarium is intact.  IMPRESSION: No acute intracranial pathology.  Age-related atrophy and chronic microvascular ischemic disease. 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   Electronically Signed   By: Anner Crete M.D.   On: 09/03/2015 02:17   Ct Chest W Contrast  09/03/2015   CLINICAL DATA:  64 year old male with recent fall and widened mediastinum on chest x-ray.  EXAM: CT CHEST WITH CONTRAST  TECHNIQUE: Multidetector CT imaging of the chest was performed during intravenous contrast administration.  CONTRAST:  63mL  OMNIPAQUE IOHEXOL 300 MG/ML  SOLN  COMPARISON:  Chest radiograph dated 09/03/2015  FINDINGS: There is subsegmental right lung base consolidative changes, likely atelectasis. Pneumonia is less likely. Trace right pleural effusion may be present. There is no pneumothorax. The central airways are patent.  The thoracic aorta is unremarkable. There is mild prominence of the main pulmonary trunk indicative of a degree of pulmonary hypertension. There is mild cardiomegaly. No significant pericardial effusion. Along the anterior and posterior. Ascending aorta represent pericardial recess. There is coronary vascular calcification. No hilar or mediastinal adenopathy. The visualized thyroid gland appears unremarkable. The esophagus is collapsed.  There is no axillary adenopathy. The chest wall soft tissues appear unremarkable. There is osteopenia with degenerative changes of the left humeral head. Right anterior fourth and fifth rib fractures noted.  A  cirrhosis with evidence of portal hypertension, splenomegaly and upper abdominal varices. High attenuating debris within the gallbladder may represent sludge or small stones or vicarious excretion of contrast.  IMPRESSION: Right anterior fourth and fifth rib fractures.  No pneumothorax.  No acute/traumatic intrathoracic or mediastinal injury.   Electronically Signed   By: Anner Crete M.D.   On: 09/03/2015 03:05   Mr Brain Wo Contrast  09/03/2015   CLINICAL DATA:  Altered mental status. Alcohol abuse. Fall at home.  EXAM: MRI HEAD WITHOUT CONTRAST  TECHNIQUE: Multiplanar, multiecho pulse sequences of the brain and surrounding structures were obtained without intravenous contrast.  COMPARISON:  Head CT from yesterday  FINDINGS: Motion degraded study, requiring fast brain protocol, but overall diagnostic.  Calvarium and upper cervical spine: No focal marrow signal abnormality. Transverse ligamentous thickening without foramen magnum stenosis.  Orbits: No significant  findings.  Sinuses and Mastoids: Clear. Mastoid and middle ears are clear.  Brain: No acute abnormality such as acute infarct, hemorrhage, hydrocephalus, or mass lesion. No evidence of large vessel occlusion.  Bifrontal and right more than left temporal pole encephalomalacia and hemosiderin staining, posttraumatic pattern. There is superimposed chronic small vessel disease with ischemic gliosis throughout the bilateral cerebral white matter. Atrophy is advanced, especially for age, and when accounting for areas of cystic encephalomalacia is generalized. Cerebellar involvement, potentially exacerbated by history of ethanol use. Prominent ventriculomegaly, including the temporal horns. Given the degree of corpus callosum thinning and cortical atrophy, this is considered secondary to volume loss rather than communicating hydrocephalus.  IMPRESSION: 1. Motion degraded study without acute finding. 2. 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   Electronically Signed   By: Monte Fantasia M.D.   On: 09/03/2015 07:36   US Abdomen Complete  09/03/2015   CLINICAL DATA:  Abnormal liver enzymes. Alcohol abuse. GI bleeding.  EXAM: ULTRASOUND ABDOMEN COMPLETE  COMPARISON:  Ultrasound dated 09/07/2013  FINDINGS: Gallbladder: No gallstones or wall thickening visualized. No sonographic Murphy sign noted.  Common bile duct: Diameter: 3.6 mm, normal.  Liver: Liver parenchyma is nodular and coarse consistent with cirrhosis.  IVC: No abnormality visualized.  Pancreas: Not visualized.  Spleen: Splenomegaly, unchanged.  Right Kidney: Length: 11.8 cm. Echogenicity within normal limits. No mass or hydronephrosis visualized.  Left Kidney: Length: 13.5 cm. Echogenicity within normal limits. No mass or hydronephrosis visualized.  Abdominal aorta: No aneurysm visualized.  Maximal diameter 2.7 cm.  Other findings: Suggestion of a tiny amount of ascites.  IMPRESSION: Tiny amount of ascites.  Cirrhosis. Chronic splenomegaly. Pancreas is obscured by bowel.   Electronically Signed   By: Lorriane Shire M.D.   On: 09/03/2015 16:18   US Abdomen Limited  09/09/2015   CLINICAL DATA:  Cirrhosis.  Ascites.  EXAM: LIMITED ABDOMEN ULTRASOUND FOR ASCITES  TECHNIQUE: Limited ultrasound survey for ascites was performed in all four abdominal quadrants.  COMPARISON:  None.  FINDINGS: Mild ascites is seen mostly in the right upper and lower quadrants, greater than left.  IMPRESSION: Mild ascites, right side greater than left.   Electronically Signed   By: Earle Gell M.D.   On: 09/09/2015 19:59   Dg Humerus Right  09/10/2015   CLINICAL DATA:  Right lateral shoulder pain for 2 weeks.  Fall.  EXAM: RIGHT HUMERUS - 2+ VIEW  COMPARISON:  None  FINDINGS: There is no evidence of fracture or other focal bone lesions. Soft tissues are unremarkable.  IMPRESSION: Negative.   Electronically Signed   By: Lovena Le  Clovis Riley M.D.   On: 09/10/2015 11:15   Dg Femur Min 2 Views Left  09/03/2015   CLINICAL DATA:  64 year old male with right leg weakness  EXAM: LEFT FEMUR 2 VIEWS  COMPARISON:  Right leg radiograph dated 09/03/2015.  FINDINGS: There is no evidence of fracture or other focal bone lesions. Soft tissues are unremarkable.  IMPRESSION: Negative.   Electronically Signed   By: Anner Crete M.D.   On: 09/03/2015 03:22   Dg Femur, Min 2 Views Right  09/03/2015   CLINICAL DATA:  64 year old male with weakness of the right lower extremity.  EXAM: RIGHT FEMUR 2 VIEWS  COMPARISON:  None.  FINDINGS: No acute fracture or dislocation. There is a focal area osteopenia involving the lateral femoral condyle seen on the lateral projection. The soft tissues are unremarkable.  IMPRESSION: No acute fracture or dislocation.   Electronically Signed   By: Anner Crete M.D.   On: 09/03/2015 03:15    Microbiology: Recent Results (from the past 240 hour(s))  Blood culture (routine x 2)     Status: None   Collection Time: 09/03/15  12:11 AM  Result Value Ref Range Status   Specimen Description BLOOD RIGHT HAND  Final   Special Requests BOTTLES DRAWN AEROBIC AND ANAEROBIC 5CC  Final   Culture  Setup Time   Final    GRAM POSITIVE COCCI IN CLUSTERS IN BOTH AEROBIC AND ANAEROBIC BOTTLES CRITICAL RESULT CALLED TO, READ BACK BY AND VERIFIED WITH: S HOFFLER,RN AT 1426 09/03/15 BY L BENFIELD    Culture   Final    STAPHYLOCOCCUS AUREUS Performed at [facility]    Report Status 09/05/2015 FINAL  Final   Organism ID, Bacteria STAPHYLOCOCCUS AUREUS  Final      Susceptibility   Staphylococcus aureus - MIC*    CIPROFLOXACIN <=0.5 SENSITIVE Sensitive     ERYTHROMYCIN <=0.25 SENSITIVE Sensitive     GENTAMICIN <=0.5 SENSITIVE Sensitive     OXACILLIN 0.5 SENSITIVE Sensitive     TETRACYCLINE <=1 SENSITIVE Sensitive     VANCOMYCIN 1 SENSITIVE Sensitive     TRIMETH/SULFA <=10 SENSITIVE Sensitive     CLINDAMYCIN <=0.25 SENSITIVE Sensitive     RIFAMPIN <=0.5 SENSITIVE Sensitive     Inducible Clindamycin NEGATIVE Sensitive     * STAPHYLOCOCCUS AUREUS  Blood culture (routine x 2)     Status: None   Collection Time: 09/03/15 12:11 AM  Result Value Ref Range Status   Specimen Description BLOOD LEFT HAND  Final   Special Requests BOTTLES DRAWN AEROBIC AND ANAEROBIC 5CC  Final   Culture  Setup Time   Final    GRAM POSITIVE COCCI IN CLUSTERS IN BOTH AEROBIC AND ANAEROBIC BOTTLES CRITICAL RESULT CALLED TO, READ BACK BY AND VERIFIED WITH: S HOFFLER,RN AT 1426 09/03/15 BY L BENFIELD    Culture   Final    STAPHYLOCOCCUS AUREUS SUSCEPTIBILITIES PERFORMED ON PREVIOUS CULTURE WITHIN THE LAST 5 DAYS. Performed at [facility]    Report Status 09/05/2015 FINAL  Final  Urine culture     Status: None   Collection Time: 09/03/15 12:24 AM  Result Value Ref Range Status   Specimen Description URINE, CATHETERIZED  Final   Special Requests NONE  Final   Culture   Final    >=100,000 COLONIES/mL STAPHYLOCOCCUS AUREUS Performed at  [facility]    Report Status 09/05/2015 FINAL  Final   Organism ID, Bacteria STAPHYLOCOCCUS AUREUS  Final      Susceptibility  Staphylococcus aureus - MIC*    CIPROFLOXACIN <=0.5 SENSITIVE Sensitive     GENTAMICIN <=0.5 SENSITIVE Sensitive     NITROFURANTOIN <=16 SENSITIVE Sensitive     OXACILLIN 0.5 SENSITIVE Sensitive     TETRACYCLINE <=1 SENSITIVE Sensitive     VANCOMYCIN <=0.5 SENSITIVE Sensitive     TRIMETH/SULFA <=10 SENSITIVE Sensitive     CLINDAMYCIN <=0.25 SENSITIVE Sensitive     RIFAMPIN <=0.5 SENSITIVE Sensitive     Inducible Clindamycin NEGATIVE Sensitive     * >=100,000 COLONIES/mL STAPHYLOCOCCUS AUREUS  MRSA PCR Screening     Status: None   Collection Time: 09/03/15  3:53 AM  Result Value Ref Range Status   MRSA by PCR NEGATIVE NEGATIVE Final    Comment:        The GeneXpert MRSA Assay (FDA approved for NASAL specimens only), is one component of a comprehensive MRSA colonization surveillance program. It is not intended to diagnose MRSA infection nor to guide or monitor treatment for MRSA infections.   Culture, blood (routine x 2)     Status: None (Preliminary result)   Collection Time: 09/05/15  6:25 PM  Result Value Ref Range Status   Specimen Description BLOOD RIGHT HAND  10 ML IN AEROBIC ONLY  Final   Special Requests Immunocompromised  Final   Culture   Final    NO GROWTH 4 DAYS Performed at [facility]    Report Status PENDING  Incomplete  C difficile quick scan w PCR reflex     Status: None   Collection Time: 09/08/15 10:50 AM  Result Value Ref Range Status   C Diff antigen NEGATIVE NEGATIVE Final   C Diff toxin NEGATIVE NEGATIVE Final   C Diff interpretation Negative for toxigenic C. difficile  Final     Labs: Basic Metabolic Panel:  Recent Labs Lab 09/06/15 0435 09/07/15 1020 09/08/15 1115 09/09/15 0540 09/10/15 0513  NA 137 133* 131* 131* 133*  K 3.1* 3.2* 3.0* 3.4* 3.6  CL 106 105 102 102 105  CO2 21* 22 22 22 22    GLUCOSE 143* 190* 192* 132* 123*  BUN 14 10 8 6 7   CREATININE 0.39* 0.39* 0.41* 0.44* 0.36*  CALCIUM 7.9* 7.6* 7.5* 7.7* 7.8*   Liver Function Tests:  Recent Labs Lab 09/05/15 0410 09/06/15 0435 09/07/15 1020 09/08/15 1115 09/09/15 0540  AST 68* 72* 98* 69* 56*  ALT 43 45 40 27 20  ALKPHOS 31* 53 70 57 56  BILITOT 5.7* 3.8* 3.4* 3.3* 3.7*  PROT 4.3* 6.1* 6.2* 6.1* 6.0*  ALBUMIN 1.5* 2.1* 2.0* 1.9* 1.7*   No results for input(s): LIPASE, AMYLASE in the last 168 hours. No results for input(s): AMMONIA in the last 168 hours. CBC:  Recent Labs Lab 09/05/15 0410 09/06/15 0435 09/07/15 1020 09/08/15 1115 09/09/15 0540  WBC 1.9* 2.7* 3.5* 4.1 4.2  HGB 10.2* 12.4* 12.1* 11.7* 11.1*  HCT 29.5* 36.2* 35.6* 34.7* 32.9*  MCV 87.8 87.9 89.2 88.7 88.9  PLT 41* 75* 98* 104* 115*   Cardiac Enzymes:  Recent Labs Lab 09/03/15 1820 09/04/15 0357  CKTOTAL  --  811*  TROPONINI 0.49*  --    BNP: BNP (last 3 results) No results for input(s): BNP in the last 8760 hours.  ProBNP (last 3 results) No results for input(s): PROBNP in the last 8760 hours.  CBG:  Recent Labs Lab 09/05/15 0813 09/07/15 0729 09/08/15 0728 09/09/15 0729 09/10/15 0756  GLUCAP 139* 162* 141* 126* 110*  Signed:  Charlynne Cousins  [facility] 09/10/2015, 2:06 PM

## 2015-09-14 ENCOUNTER — Non-Acute Institutional Stay (SKILLED_NURSING_FACILITY): Payer: Medicare Other | Admitting: Internal Medicine

## 2015-09-14 DIAGNOSIS — G9340 Encephalopathy, unspecified: Secondary | ICD-10-CM

## 2015-09-14 DIAGNOSIS — B9561 Methicillin susceptible Staphylococcus aureus infection as the cause of diseases classified elsewhere: Secondary | ICD-10-CM

## 2015-09-14 DIAGNOSIS — R7881 Bacteremia: Secondary | ICD-10-CM | POA: Diagnosis not present

## 2015-09-14 DIAGNOSIS — I4581 Long QT syndrome: Secondary | ICD-10-CM | POA: Diagnosis not present

## 2015-09-14 DIAGNOSIS — R197 Diarrhea, unspecified: Secondary | ICD-10-CM | POA: Diagnosis not present

## 2015-09-14 DIAGNOSIS — I82401 Acute embolism and thrombosis of unspecified deep veins of right lower extremity: Secondary | ICD-10-CM | POA: Diagnosis not present

## 2015-09-14 DIAGNOSIS — E871 Hypo-osmolality and hyponatremia: Secondary | ICD-10-CM

## 2015-09-14 DIAGNOSIS — R9431 Abnormal electrocardiogram [ECG] [EKG]: Secondary | ICD-10-CM

## 2015-09-14 DIAGNOSIS — K7031 Alcoholic cirrhosis of liver with ascites: Secondary | ICD-10-CM | POA: Diagnosis not present

## 2015-09-14 DIAGNOSIS — D696 Thrombocytopenia, unspecified: Secondary | ICD-10-CM

## 2015-09-14 DIAGNOSIS — G629 Polyneuropathy, unspecified: Secondary | ICD-10-CM

## 2015-09-14 DIAGNOSIS — E785 Hyperlipidemia, unspecified: Secondary | ICD-10-CM | POA: Diagnosis not present

## 2015-09-14 DIAGNOSIS — M6282 Rhabdomyolysis: Secondary | ICD-10-CM | POA: Diagnosis not present

## 2015-09-14 DIAGNOSIS — M1990 Unspecified osteoarthritis, unspecified site: Secondary | ICD-10-CM

## 2015-09-14 NOTE — Progress Notes (Signed)
MRN: [id number] Name: Joshua Waller  Sex: male Age: 64 y.o. DOB: Jan 15, 1951  Anderson #: Helene Kelp Facility/Room:108 Level Of Care: SNF Provider: Inocencio Homes D Emergency Contacts: Extended Emergency Contact Information Primary Emergency Contact: Duanne [place] Mobile Phone: [phone] Relation: Sister Secondary Emergency Contact: Dix,Randy  [place] Mobile Phone: [phone] Relation: None  Code Status:   Allergies: Aspirin  No chief complaint on file.   HPI: Patient is 64 y.o. male with PMH of alcohol abuse, Mallory-Weiss tear, seizure, thrombocytopenia, gastric ulcer, GI bleeding, who presents with syncope, increased urinary frequency, diarrhea, right leg weakness, pain over both thighs, AMS. Pt was admitted to hospital from 9/7-15 where he was found to have and treated for staph aureas bacteremia and rhabdomyolisis in the setting of alcoholic cirrhosis with ascites, complicated by RLE DVTtx with IV heparin then coumadin.  Pt is admitted to SNF for continuation of IV abx through his PICC line until 9/25 and for generalized weakness requiring OT/PT. While at SNF pt will be followed for his diarrhea, tx with floraster and lomotil, HLD, tx with lipitor and neuropathy tx with Neurontin.  Past Medical History  Diagnosis Date  . Lumbar disc disorder   . Anemia   . Neuropathy of lower extremity 2007    very poor balance  . Glaucoma (increased eye pressure)     bilateral  . Gastric ulcer   . Chronic back pain   . Alcohol abuse   . 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 Gastritis   . GI bleeding   . Seizures     unknown type  . Arthritis     back & neck    Past Surgical History  Procedure Laterality Date  . Esophagogastroduodenoscopy  12/29/2011    Procedure: ESOPHAGOGASTRODUODENOSCOPY (EGD);  Surgeon: Zenovia Jarred, MD;  Location: [facility] ENDOSCOPY;  Service: Gastroenterology;  Laterality: N/A;  . Tonsillectomy    . Esophagogastroduodenoscopy   12/22/2012    Procedure: ESOPHAGOGASTRODUODENOSCOPY (EGD);  Surgeon: Lear Ng, MD;  Location: [facility] ENDOSCOPY;  Service: Endoscopy;  Laterality: N/A;  . Colonoscopy  12/23/2012    Procedure: COLONOSCOPY;  Surgeon: Lear Ng, MD;  Location: [facility] ENDOSCOPY;  Service: Endoscopy;  Laterality: N/A;  . Esophagogastroduodenoscopy N/A 09/06/2013    Procedure: ESOPHAGOGASTRODUODENOSCOPY (EGD);  Surgeon: Beryle Beams, MD;  Location: [facility] ENDOSCOPY;  Service: Endoscopy;  Laterality: N/A;  bedside  . Colonoscopy N/A 09/10/2013    Procedure: COLONOSCOPY;  Surgeon: Beryle Beams, MD;  Location: [facility] ENDOSCOPY;  Service: Endoscopy;  Laterality: N/A;  . Esophagogastroduodenoscopy N/A 11/08/2013    Procedure: ESOPHAGOGASTRODUODENOSCOPY (EGD);  Surgeon: Beryle Beams, MD;  Location: [facility] ENDOSCOPY;  Service: Endoscopy;  Laterality: N/A;  . Tee without cardioversion N/A 09/09/2015    Procedure: TRANSESOPHAGEAL ECHOCARDIOGRAM (TEE) (INPATIENT AT [place]) ;  Surgeon: Jolaine Artist, MD;  Location: [facility] ENDOSCOPY;  Service: Cardiovascular;  Laterality: N/A;      Medication List       This list is accurate as of: 09/14/15 11:59 PM.  Always use your most recent med list.               atorvastatin 40 MG tablet  Commonly known as:  LIPITOR  Take 1 tablet (40 mg total) by mouth daily at 6 PM.     ceFAZolin 2-3 GM-% Solr  Commonly known as:  ANCEF  Inject 50 mLs (2 g total) into the vein every 8 (eight) hours.     diphenoxylate-atropine 2.5-0.025 MG per  tablet  Commonly known as:  LOMOTIL  Take 1 tablet by mouth 4 (four) times daily as needed for diarrhea or loose stools.     enoxaparin 150 MG/ML injection  Commonly known as:  LOVENOX  Inject 0.66 mLs (100 mg total) into the skin every 12 (twelve) hours.     gabapentin 600 MG tablet  Commonly known as:  NEURONTIN  Take 600 mg by mouth 3 (three) times daily.     oxyCODONE 5 MG immediate release tablet  Commonly known as:  Oxy  IR/ROXICODONE  Take 1 tablet (5 mg total) by mouth every 6 (six) hours as needed for severe pain.     saccharomyces boulardii 250 MG capsule  Commonly known as:  FLORASTOR  Take 1 capsule (250 mg total) by mouth 2 (two) times daily.     traMADol 50 MG tablet  Commonly known as:  ULTRAM  Take 100 mg by mouth 2 (two) times daily.     warfarin 2.5 MG tablet  Commonly known as:  COUMADIN  Take 1 tablet (2.5 mg total) by mouth one time only at 6 PM.        No orders of the defined types were placed in this encounter.    Immunization History  Administered Date(s) Administered  . Influenza,inj,Quad PF,36+ Mos 09/07/2013, 09/10/2015  . Pneumococcal Polysaccharide-23 09/07/2013    Social History  Substance Use Topics  . Smoking status: Never Smoker   . Smokeless tobacco: Never Used  . Alcohol Use: 3.6 oz/week    6 Cans of beer per week     Comment: heavy drinker    Family history is + HD   Review of Systems  DATA OBTAINED: from nurse - no concerns GENERAL:  no fevers, fatigue, appetite changes SKIN: No itching, rash or wounds EYES: No eye pain, redness, discharge EARS: No earache, tinnitus, change in hearing NOSE: No congestion, drainage or bleeding  MOUTH/THROAT: No mouth or tooth pain, No sore throat RESPIRATORY: No cough, wheezing, SOB CARDIAC: No chest pain, palpitations, lower extremity edema  GI: No abdominal pain, No N/V/D or constipation, No heartburn or reflux  GU: No dysuria, frequency or urgency, or incontinence  MUSCULOSKELETAL: No unrelieved bone/joint pain NEUROLOGIC: No headache, dizziness  PSYCHIATRIC: No c/o anxiety or sadness   Filed Vitals:   09/16/15 2111  BP: 119/64  Pulse: 69  Temp: 98.7 F (37.1 C)  Resp: 18    SpO2 Readings from Last 1 Encounters:  09/10/15 98%        Physical Exam  GENERAL APPEARANCE: Alert,  No acute distress.  SKIN: No diaphoresis rash HEAD: Normocephalic, atraumatic  EYES: Conjunctiva/lids clear. Pupils  round, reactive. EOMs intact.  EARS: External exam WNL, canals clear. Hearing grossly normal.  NOSE: No deformity or discharge.  MOUTH/THROAT: Lips w/o lesions  RESPIRATORY: Breathing is even, unlabored. Lung sounds are clear   CARDIOVASCULAR: Heart RRR no murmurs, rubs or gallops. + peripheral edema.   GASTROINTESTINAL: Abdomen is soft, non-tender, protuberant with some ascites w/ normal bowel sounds. GENITOURINARY: Bladder non tender, not distended  MUSCULOSKELETAL: No abnormal joints or musculature NEUROLOGIC:  Cranial nerves 2-12 grossly intact. Moves all extremities  PSYCHIATRIC: pleasantly confused, no behavioral issues  Patient Active Problem List   Diagnosis Date Noted  . Diarrhea 09/16/2015  . Hyperlipidemia 09/16/2015  . DVT (deep venous thrombosis) 09/07/2015  . Alcoholic cirrhosis of liver with ascites   . Staphylococcus aureus bacteremia 09/04/2015  . Hyperglycemia 09/04/2015  . Long QT interval   .  UTI (lower urinary tract infection) 09/03/2015  . Sepsis due to Staphylococcus 09/03/2015  . Abnormal LFTs 09/03/2015  . Mediastinal widening 09/03/2015  . Acute encephalopathy 09/03/2015  . Syncope 09/03/2015  . Rhabdomyolysis 09/03/2015  . Elevated troponin 09/03/2015  . Cirrhosis of liver 09/08/2013  . Splenomegaly 09/08/2013  . Hypokalemia 09/08/2013  . Thrombocytopenia 09/07/2013  . Esophageal varices 09/07/2013  . Unspecified gastritis and gastroduodenitis without mention of hemorrhage 09/07/2013  . Portal hypertensive gastropathy 09/07/2013  . Hematemesis/vomiting blood 09/05/2013  . Melena 09/05/2013  . Weakness of right leg 09/05/2013  . Health examination of prisoner 09/05/2013  . GIB (gastrointestinal bleeding) 09/05/2013  . Hyponatremia 09/05/2013  . ETOH abuse 12/29/2011  . Anemia associated with acute blood loss 12/29/2011  . Neuropathy 12/29/2011  . Arthritis 12/29/2011  . Gastric ulcer 12/29/2011  . Mallory - Weiss tear 12/29/2011    CBC     Component Value Date/Time   WBC 4.2 09/09/2015 0540   RBC 3.70* 09/09/2015 0540   HGB 11.1* 09/09/2015 0540   HCT 32.9* 09/09/2015 0540   PLT 115* 09/09/2015 0540   MCV 88.9 09/09/2015 0540   LYMPHSABS 0.5* 09/03/2015 0011   MONOABS 0.5 09/03/2015 0011   EOSABS 0.0 09/03/2015 0011   BASOSABS 0.0 09/03/2015 0011    CMP     Component Value Date/Time   NA 133* 09/10/2015 0513   K 3.6 09/10/2015 0513   CL 105 09/10/2015 0513   CO2 22 09/10/2015 0513   GLUCOSE 123* 09/10/2015 0513   BUN 7 09/10/2015 0513   CREATININE 0.36* 09/10/2015 0513   CALCIUM 7.8* 09/10/2015 0513   PROT 6.0* 09/09/2015 0540   ALBUMIN 1.7* 09/09/2015 0540   AST 56* 09/09/2015 0540   ALT 20 09/09/2015 0540   ALKPHOS 56 09/09/2015 0540   BILITOT 3.7* 09/09/2015 0540   GFRNONAA >60 09/10/2015 0513   GFRAA >60 09/10/2015 0513    Lab Results  Component Value Date   HGBA1C 6.3* 09/03/2015     Dg Chest 2 View  09/03/2015   CLINICAL DATA:  64 year old male with fall and fever.  EXAM: CHEST  2 VIEW  COMPARISON:  Chest radiograph dated 09/25/2013 all  FINDINGS: There is widening of the cardiomediastinal silhouette, significantly increased from prior study. This may be partially related to shallow inspiratory effort and imaging technique. Underlying Traumatic aortic or mediastinal injury is not excluded. Clinical correlation recommended. CT with contrast may provide better evaluation there is clinical concern for traumatic aortic or mediastinal injury. There are bibasilar linear atelectatic changes. There is no focal consolidation, pleural effusion, or pneumothorax. There are degenerative changes of the shoulders. No acute fracture.  IMPRESSION: Enlarged cardiomediastinal silhouette. Underlying traumatic mediastinal injury is not excluded. Clinical correlation is recommended. CT of the chest may provide better evaluation if clinically indicated.  No focal consolidation.  These results were called by telephone at the  time of interpretation on 09/03/2015 at 1:09 am to Dr. Gareth Morgan , who verbally acknowledged these results.   Electronically Signed   By: Anner Crete M.D.   On: 09/03/2015 01:09   Ct Head Wo Contrast  09/03/2015   CLINICAL DATA:  64 year old male with fall.  EXAM: CT HEAD WITHOUT CONTRAST  TECHNIQUE: Contiguous axial images were obtained from the base of the skull through the vertex without intravenous contrast.  COMPARISON:  CT dated 03/01/2015  FINDINGS: There is stable dilatation of the ventricles out of proportion with the sulci which may represent central volume loss versus  normal pressure hydrocephalus. Clinical correlation is recommended. Periventricular and deep white matter hypodensities represent chronic microvascular ischemic changes. Stable areas of bifrontal old infarct and encephalomalacia changes noted. There is no intracranial hemorrhage. No mass effect or midline shift identified.  The visualized paranasal sinuses and mastoid air cells are well aerated. The calvarium is intact.  IMPRESSION: No acute intracranial pathology.  Age-related atrophy and chronic microvascular ischemic disease. 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   Electronically Signed   By: Anner Crete M.D.   On: 09/03/2015 02:17   Ct Chest W Contrast  09/03/2015   CLINICAL DATA:  64 year old male with recent fall and widened mediastinum on chest x-ray.  EXAM: CT CHEST WITH CONTRAST  TECHNIQUE: Multidetector CT imaging of the chest was performed during intravenous contrast administration.  CONTRAST:  1mL OMNIPAQUE IOHEXOL 300 MG/ML  SOLN  COMPARISON:  Chest radiograph dated 09/03/2015  FINDINGS: There is subsegmental right lung base consolidative changes, likely atelectasis. Pneumonia is less likely. Trace right pleural effusion may be present. There is no pneumothorax. The central airways are patent.  The thoracic aorta is unremarkable. There is mild prominence of the main pulmonary trunk indicative of a degree of pulmonary  hypertension. There is mild cardiomegaly. No significant pericardial effusion. Along the anterior and posterior. Ascending aorta represent pericardial recess. There is coronary vascular calcification. No hilar or mediastinal adenopathy. The visualized thyroid gland appears unremarkable. The esophagus is collapsed.  There is no axillary adenopathy. The chest wall soft tissues appear unremarkable. There is osteopenia with degenerative changes of the left humeral head. Right anterior fourth and fifth rib fractures noted.  A cirrhosis with evidence of portal hypertension, splenomegaly and upper abdominal varices. High attenuating debris within the gallbladder may represent sludge or small stones or vicarious excretion of contrast.  IMPRESSION: Right anterior fourth and fifth rib fractures.  No pneumothorax.  No acute/traumatic intrathoracic or mediastinal injury.   Electronically Signed   By: Anner Crete M.D.   On: 09/03/2015 03:05   Mr Brain Wo Contrast  09/03/2015   CLINICAL DATA:  Altered mental status. Alcohol abuse. Fall at home.  EXAM: MRI HEAD WITHOUT CONTRAST  TECHNIQUE: Multiplanar, multiecho pulse sequences of the brain and surrounding structures were obtained without intravenous contrast.  COMPARISON:  Head CT from yesterday  FINDINGS: Motion degraded study, requiring fast brain protocol, but overall diagnostic.  Calvarium and upper cervical spine: No focal marrow signal abnormality. Transverse ligamentous thickening without foramen magnum stenosis.  Orbits: No significant findings.  Sinuses and Mastoids: Clear. Mastoid and middle ears are clear.  Brain: No acute abnormality such as acute infarct, hemorrhage, hydrocephalus, or mass lesion. No evidence of large vessel occlusion.  Bifrontal and right more than left temporal pole encephalomalacia and hemosiderin staining, posttraumatic pattern. There is superimposed chronic small vessel disease with ischemic gliosis throughout the bilateral cerebral white  matter. Atrophy is advanced, especially for age, and when accounting for areas of cystic encephalomalacia is generalized. Cerebellar involvement, potentially exacerbated by history of ethanol use. Prominent ventriculomegaly, including the temporal horns. Given the degree of corpus callosum thinning and cortical atrophy, this is considered secondary to volume loss rather than communicating hydrocephalus.  IMPRESSION: 1. Motion degraded study without acute finding. 2. 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   Electronically Signed   By: Monte Fantasia M.D.   On: 09/03/2015 07:36   US Abdomen Complete  09/03/2015   CLINICAL DATA:  Abnormal liver enzymes. Alcohol abuse. GI bleeding.  EXAM: ULTRASOUND  ABDOMEN COMPLETE  COMPARISON:  Ultrasound dated 09/07/2013  FINDINGS: Gallbladder: No gallstones or wall thickening visualized. No sonographic Murphy sign noted.  Common bile duct: Diameter: 3.6 mm, normal.  Liver: Liver parenchyma is nodular and coarse consistent with cirrhosis.  IVC: No abnormality visualized.  Pancreas: Not visualized.  Spleen: Splenomegaly, unchanged.  Right Kidney: Length: 11.8 cm. Echogenicity within normal limits. No mass or hydronephrosis visualized.  Left Kidney: Length: 13.5 cm. Echogenicity within normal limits. No mass or hydronephrosis visualized.  Abdominal aorta: No aneurysm visualized.  Maximal diameter 2.7 cm.  Other findings: Suggestion of a tiny amount of ascites.  IMPRESSION: Tiny amount of ascites. Cirrhosis. Chronic splenomegaly. Pancreas is obscured by bowel.   Electronically Signed   By: Lorriane Shire M.D.   On: 09/03/2015 16:18   Dg Femur Min 2 Views Left  09/03/2015   CLINICAL DATA:  64 year old male with right leg weakness  EXAM: LEFT FEMUR 2 VIEWS  COMPARISON:  Right leg radiograph dated 09/03/2015.  FINDINGS: There is no evidence of fracture or other focal bone lesions. Soft tissues are unremarkable.  IMPRESSION:  Negative.   Electronically Signed   By: Anner Crete M.D.   On: 09/03/2015 03:22   Dg Femur, Min 2 Views Right  09/03/2015   CLINICAL DATA:  64 year old male with weakness of the right lower extremity.  EXAM: RIGHT FEMUR 2 VIEWS  COMPARISON:  None.  FINDINGS: No acute fracture or dislocation. There is a focal area osteopenia involving the lateral femoral condyle seen on the lateral projection. The soft tissues are unremarkable.  IMPRESSION: No acute fracture or dislocation.   Electronically Signed   By: Anner Crete M.D.   On: 09/03/2015 03:15    Not all labs, radiology exams or other studies done during hospitalization come through on my EPIC note; however they are reviewed by me.    Assessment and Plan  Staphylococcus aureus bacteremia He was initially started on IV vancomycin and Zosyn. - He had to order 2 blood cultures and urine cultures that showed MSSA, antibiotic coverage was the escalated to cefazolin, ID was consulted and recommended a TEE which did not show vegetation. 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 Blood cultures on 09/05/2015 have remained negative till date, PICC line placed on 9.15.2016. SNF - Will cont cefazolin until 9.24.2016. Will remove PICC line on 9.25.2016.   Diarrhea C. difficile negative; SNF -  florastor while on abx and prn lomotil   DVT (deep venous thrombosis) He was started on IV heparin and Coumadin. SNF  Lovenox once a day and actively titrate Coumadin for goal INR 2-3; cont 3 months  Rhabdomyolysis On admission his CK was 3300 he was started on IV fluid and does resolve  Alcoholic cirrhosis of liver with ascites Likely due to alcohol abuse. Mild ascites, his Aldactone was increased. SNF -  fluid restriction diet and low-sodium diet and aldactone  Hyponatremia Initially likely due to decreased intravascular and secondary hyperaldosteronism due to cirrhosis. His Aldactone was increased his sodium is improving. SNF -  cont aldactone and monitor Na with BMP  Thrombocytopenia Platelets are slowly improving; SNF - monitor with CBC  Acute encephalopathy - Likely multifactorial from alcohol abuse, UTI, rhabdomyolysis and delirium - MRI of the brain was done that showed Advanced brain atrophy with superimposed traumatic pattern frontal temporal encephalomalacia, possibly due to alcohol abuse SNF - Continue supportive management. Ammonia level 31 - monitor level   Arthritis X-ray of the right OA. SNF - Follow  up with ortho as an outpatient  Neuropathy SNF - cont neurontin 600 mg TID  Long QT interval SNF - careful with antipsychotics which pt is at risk to need  Hyperlipidemia SNF - cont lipitor   Time spent > 45; > 50% of time with patient was spent reviewing records, labs, tests and studies, counseling and developing plan of care  Hennie Duos, MD

## 2015-09-16 ENCOUNTER — Encounter: Payer: Self-pay | Admitting: Internal Medicine

## 2015-09-16 DIAGNOSIS — E785 Hyperlipidemia, unspecified: Secondary | ICD-10-CM | POA: Insufficient documentation

## 2015-09-16 DIAGNOSIS — R197 Diarrhea, unspecified: Secondary | ICD-10-CM | POA: Insufficient documentation

## 2015-09-16 NOTE — Assessment & Plan Note (Signed)
Likely due to alcohol abuse. Mild ascites, his Aldactone was increased. SNF -  fluid restriction diet and low-sodium diet and aldactone

## 2015-09-16 NOTE — Assessment & Plan Note (Signed)
SNF - cont neurontin 600 mg TID

## 2015-09-16 NOTE — Assessment & Plan Note (Signed)
He was started on IV heparin and Coumadin. SNF  Lovenox once a day and actively titrate Coumadin for goal INR 2-3; cont 3 months

## 2015-09-16 NOTE — Assessment & Plan Note (Signed)
On admission his CK was 3300 he was started on IV fluid and does resolve

## 2015-09-16 NOTE — Assessment & Plan Note (Signed)
-   Likely multifactorial from alcohol abuse, UTI, rhabdomyolysis and delirium - MRI of the brain was done that showed Advanced brain atrophy with superimposed traumatic pattern frontal temporal encephalomalacia, possibly due to alcohol abuse SNF - Continue supportive management. Ammonia level 31 - monitor level

## 2015-09-16 NOTE — Assessment & Plan Note (Signed)
He was initially started on IV vancomycin and Zosyn. - He had to order 2 blood cultures and urine cultures that showed MSSA, antibiotic coverage was the escalated to cefazolin, ID was consulted and recommended a TEE which did not show vegetation. 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 Blood cultures on 09/05/2015 have remained negative till date, PICC line placed on 9.15.2016. SNF - Will cont cefazolin until 9.24.2016. Will remove PICC line on 9.25.2016.

## 2015-09-16 NOTE — Assessment & Plan Note (Signed)
Platelets are slowly improving; SNF - monitor with CBC

## 2015-09-16 NOTE — Assessment & Plan Note (Signed)
C. difficile negative; SNF -  florastor while on abx and prn lomotil

## 2015-09-16 NOTE — Assessment & Plan Note (Signed)
Initially likely due to decreased intravascular and secondary hyperaldosteronism due to cirrhosis. His Aldactone was increased his sodium is improving. SNF - cont aldactone and monitor Na with BMP

## 2015-09-16 NOTE — Assessment & Plan Note (Signed)
X-ray of the right OA. SNF - Follow up with ortho as an outpatient

## 2015-09-16 NOTE — Assessment & Plan Note (Signed)
SNF - careful with antipsychotics which pt is at risk to need

## 2015-09-16 NOTE — Assessment & Plan Note (Signed)
SNF - cont lipitor

## 2015-09-26 DEATH — deceased

## 2016-10-20 IMAGING — CR DG FEMUR 2+V*R*
4 series · 4 of 4 positions shown · non-contrast
Comparison: None.

CLINICAL DATA: 64-year-old male with weakness of the right lower
extremity.

EXAM:
RIGHT FEMUR 2 VIEWS

[x femur proximal ap right]
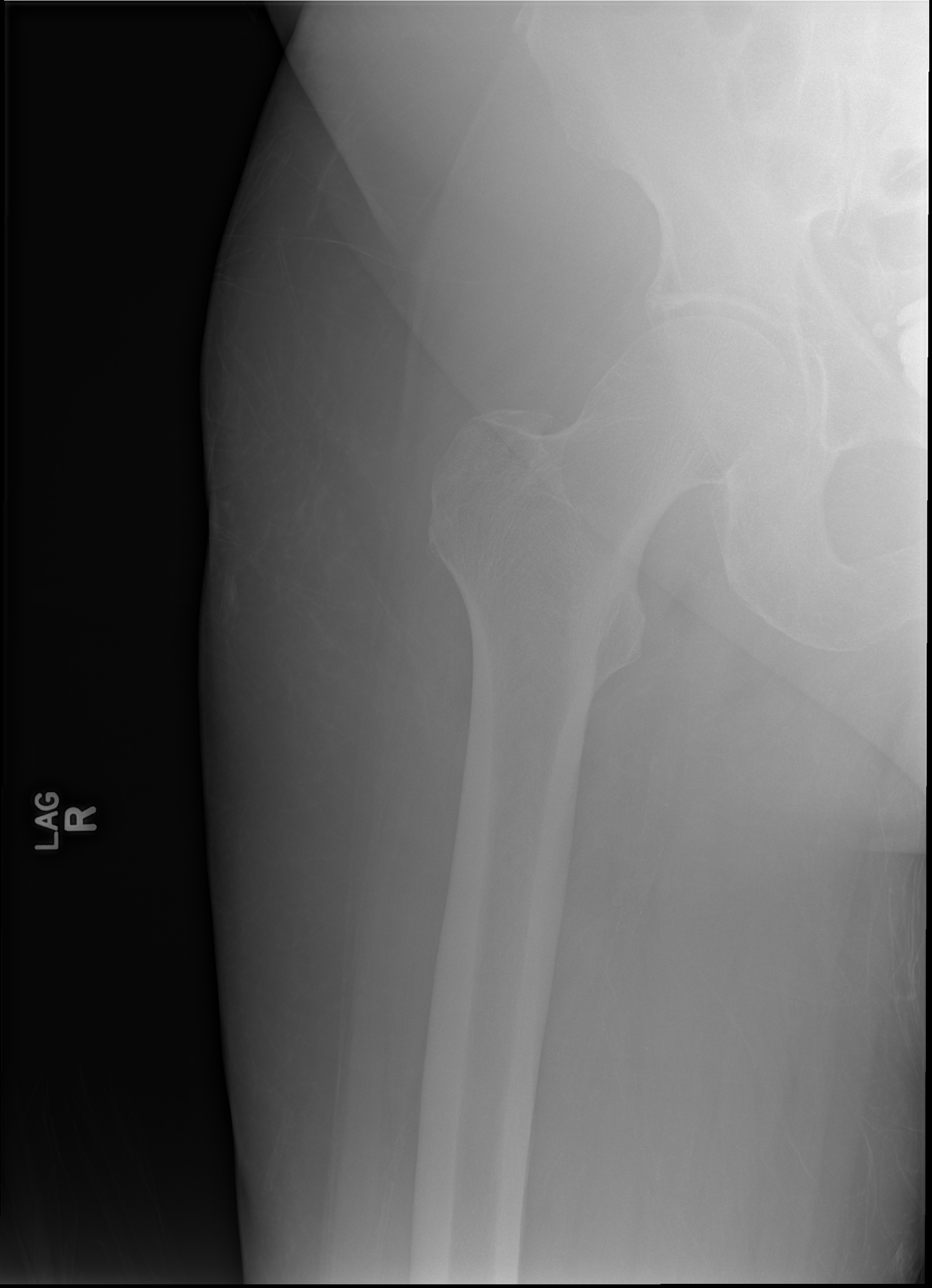

[x femur distal ap right]
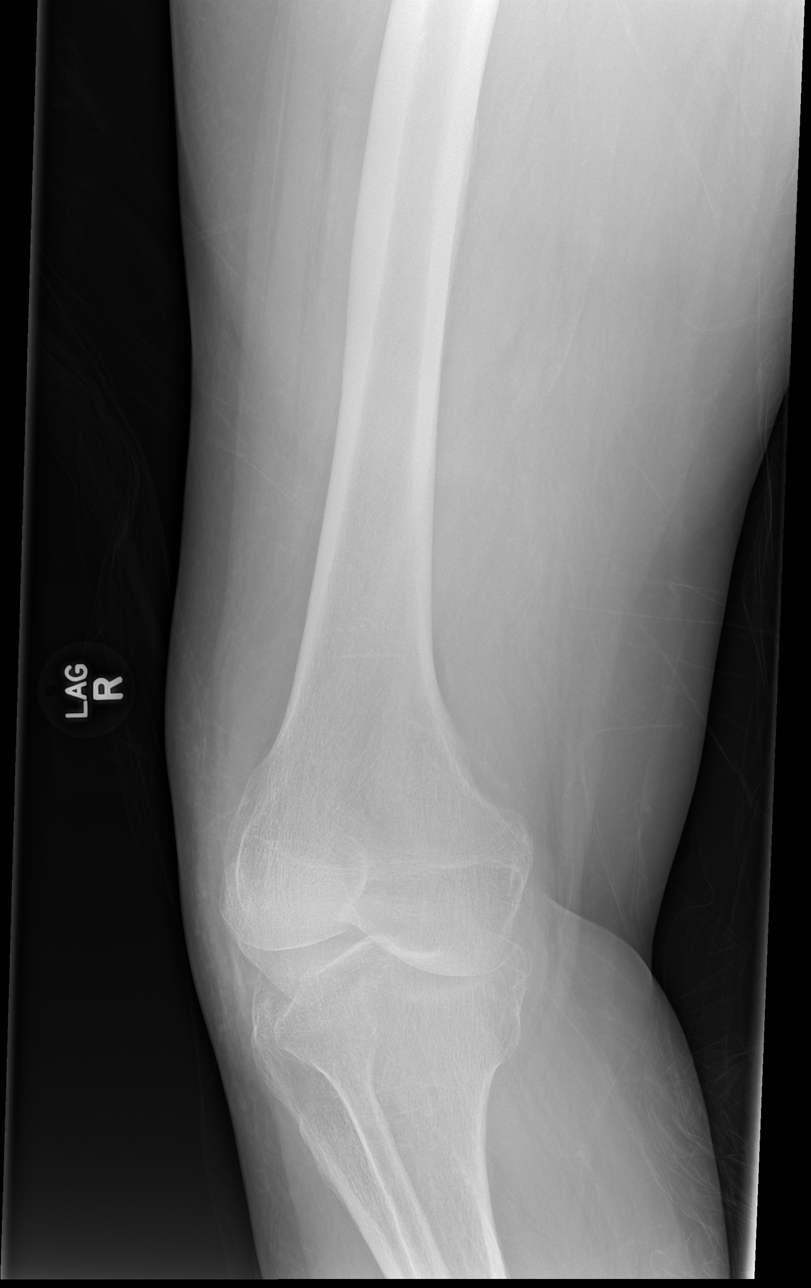

[x femur distal lat right]
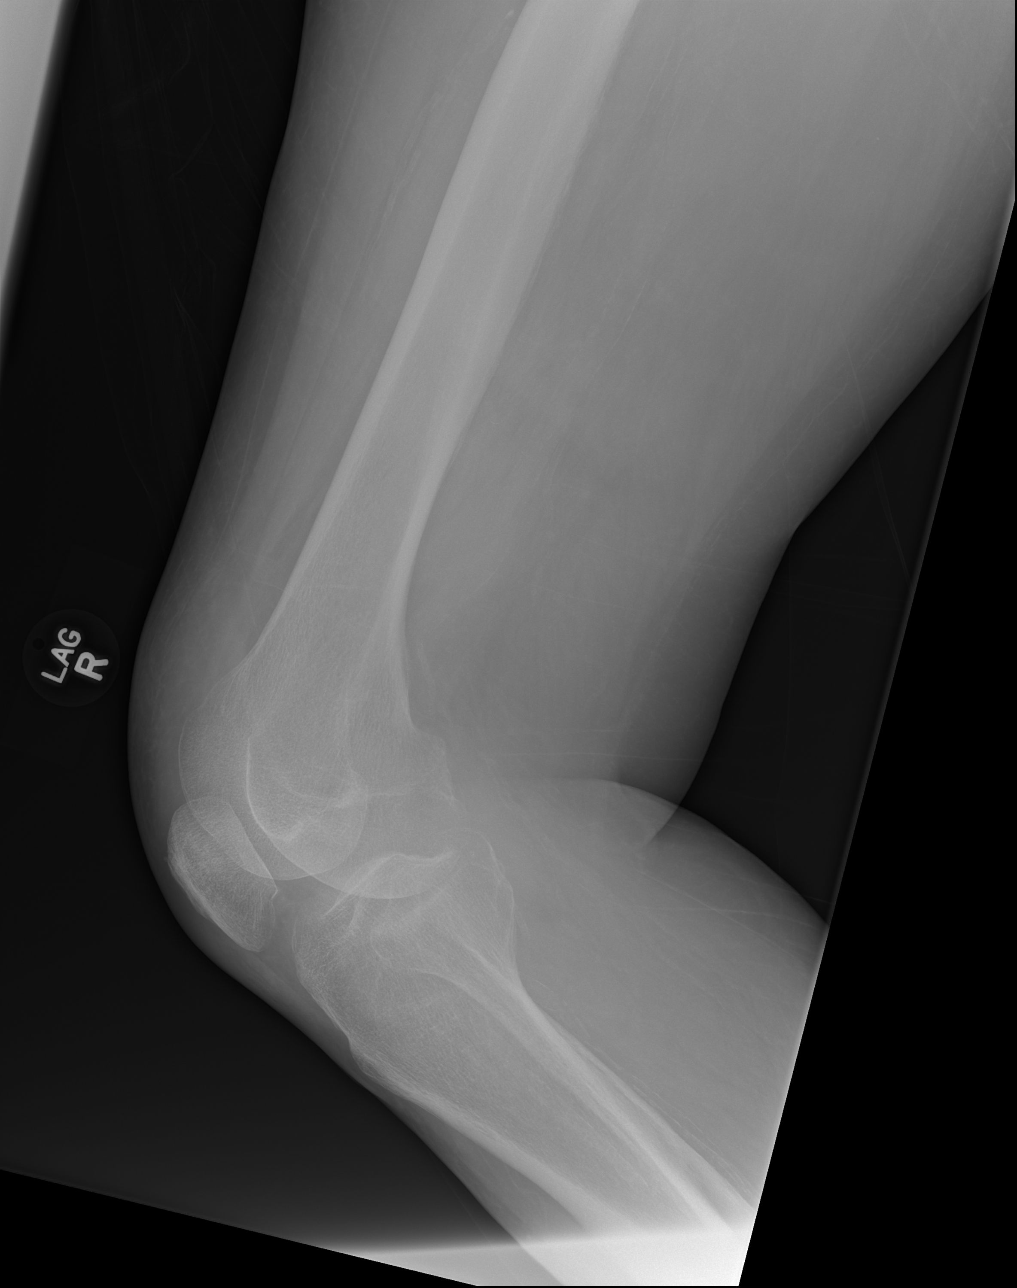

[x hip lat right]
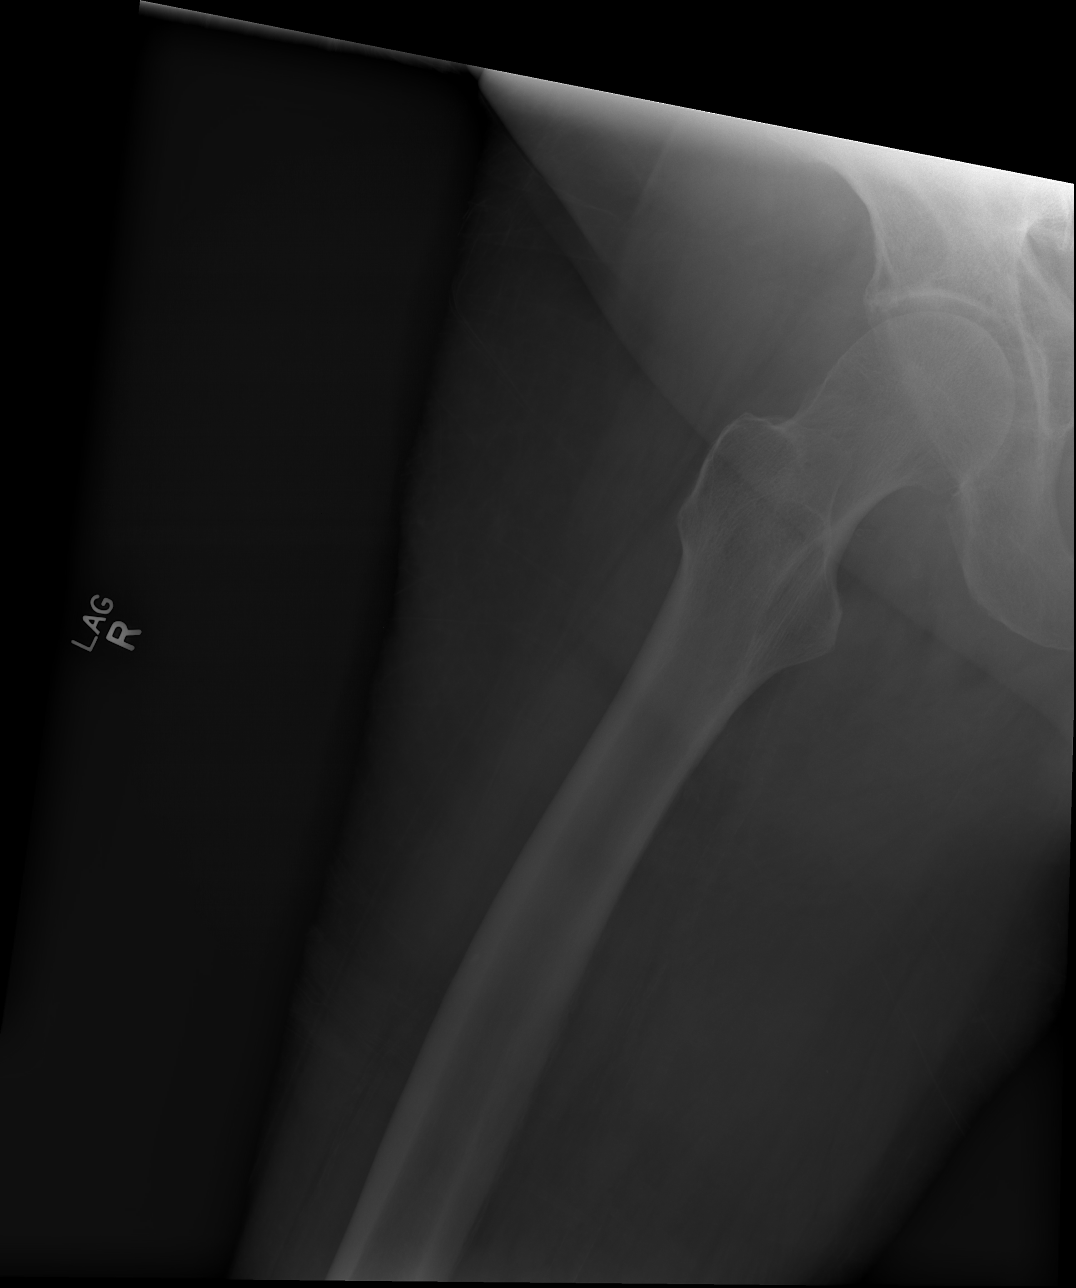

[4 of 4 positions shown; findings below may reference images not displayed]

FINDINGS: No acute fracture or dislocation. There is a focal area osteopenia
involving the lateral femoral condyle seen on the lateral
projection. The soft tissues are unremarkable.
IMPRESSION: No acute fracture or dislocation.

## 2016-10-20 IMAGING — CT CT HEAD W/O CM
2 series · 17 of 30 positions shown, 20 images · non-contrast
Comparison: CT dated 03/01/2015

CLINICAL DATA: 64-year-old male with fall.

EXAM:
CT HEAD WITHOUT CONTRAST
TECHNIQUE: Contiguous axial images were obtained from the base of the skull
through the vertex without intravenous contrast.

[Series 2: head w/o · axial · non-contrast · 0.46mm/px · z∈[+1223,+1353]mm · 9 of 34 slices shown, 12 images]
[im 4/34  brain]
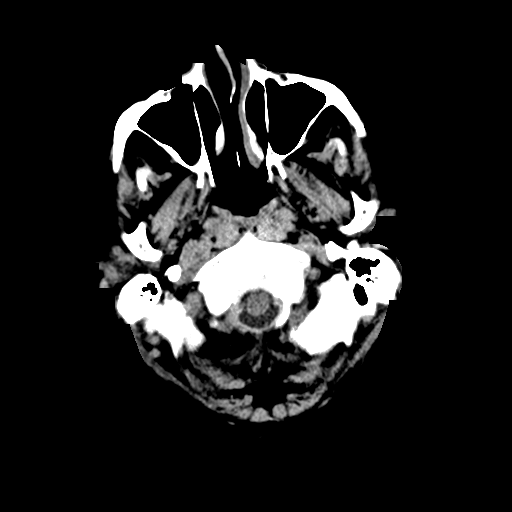
[im 4/34  bone]
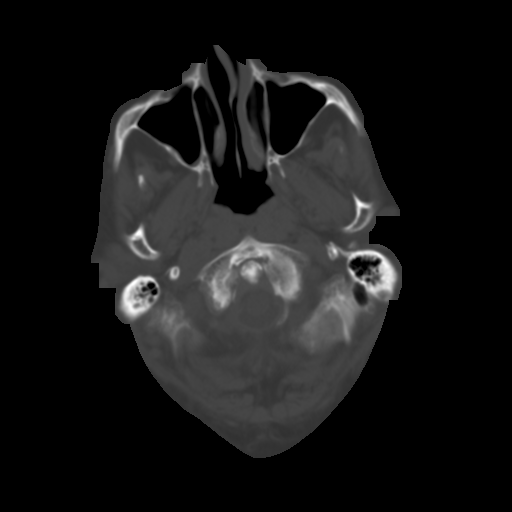
[im 7/34  brain]
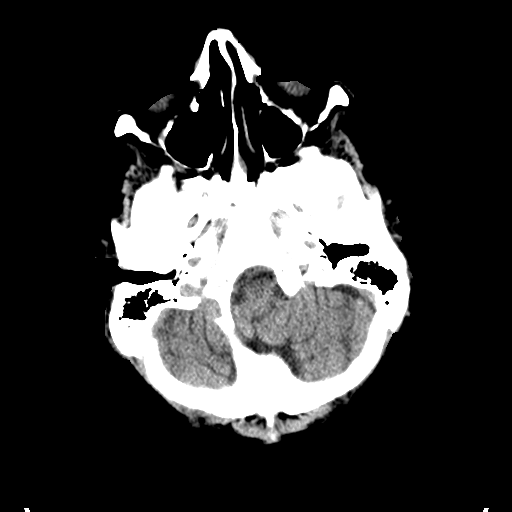
[im 10/34  brain]
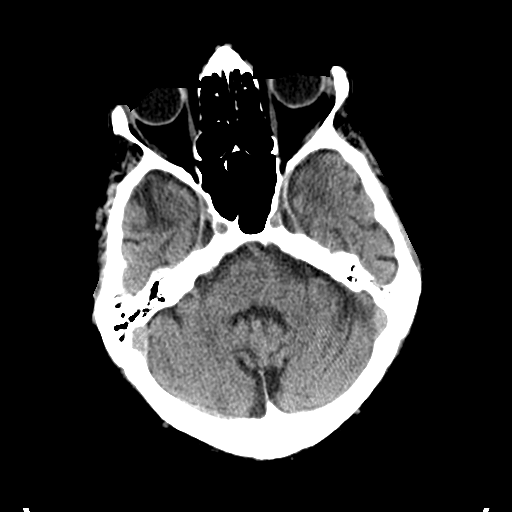
[im 14/34  brain]
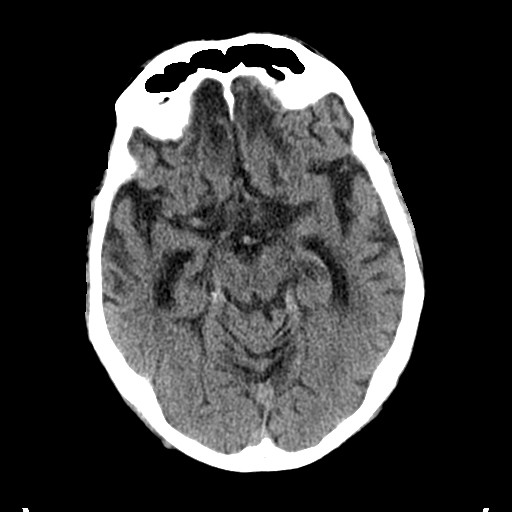
[im 17/34  brain]
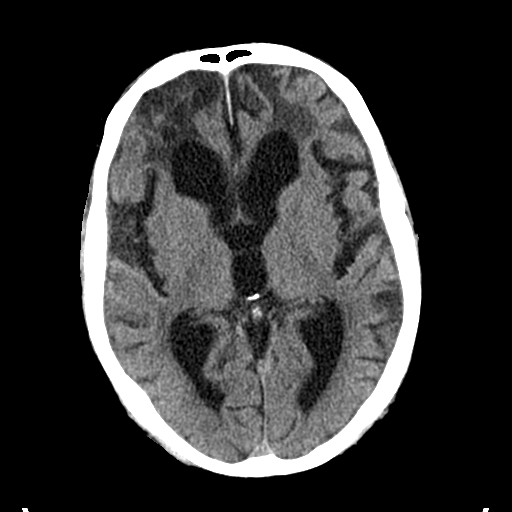
[im 17/34  bone]
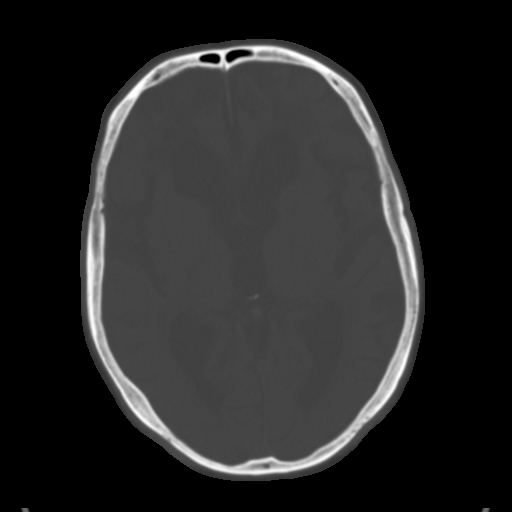
[im 20/34  brain]
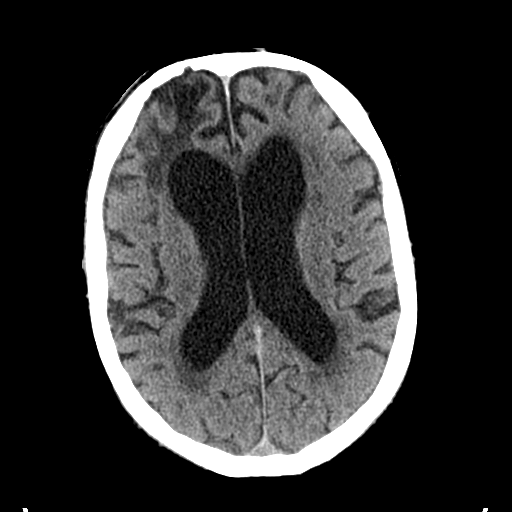
[im 24/34  brain]
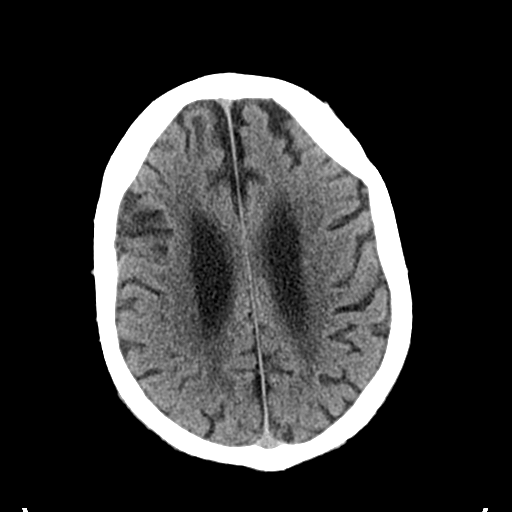
[im 27/34  brain]
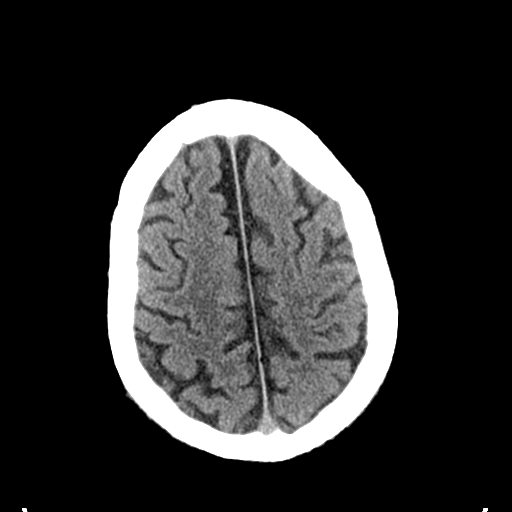
[im 30/34  brain]
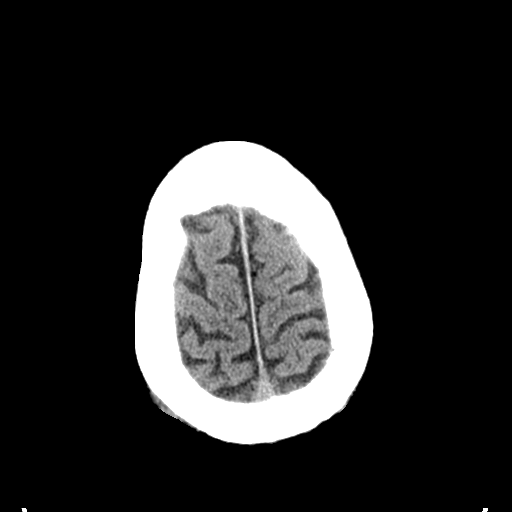
[im 30/34  bone]
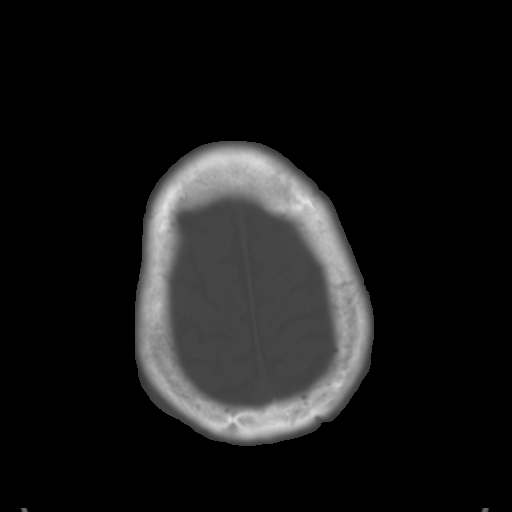

[Series 3: bone windows · axial · 0.46mm/px · z∈[+1226,+1355]mm · 8 of 57 slices shown]
[im 7/57  bone]
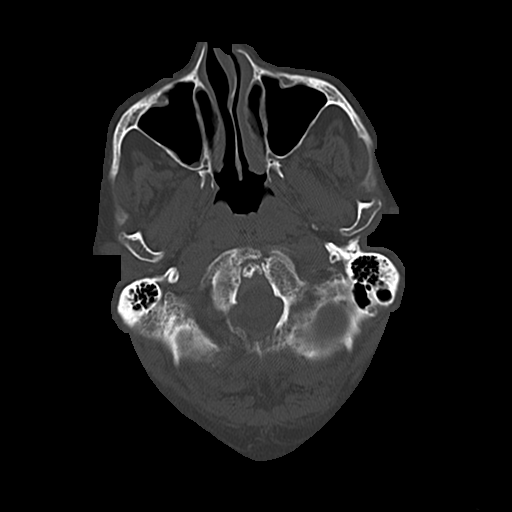
[im 13/57  bone]
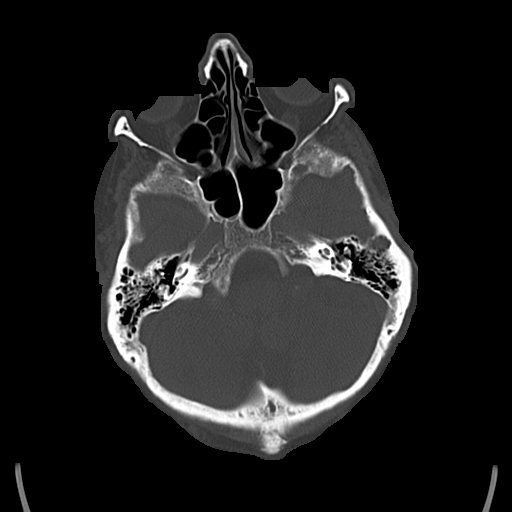
[im 19/57  bone]
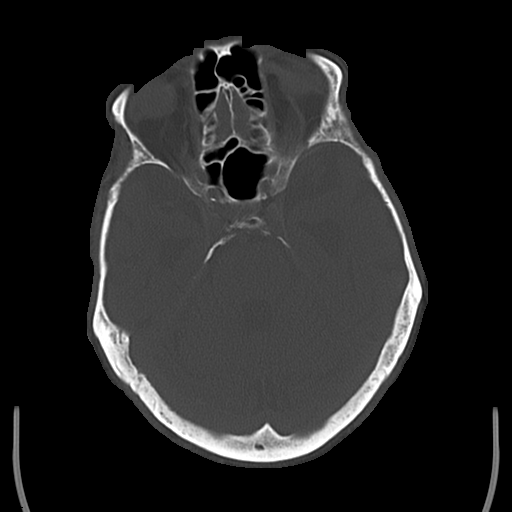
[im 25/57  bone]
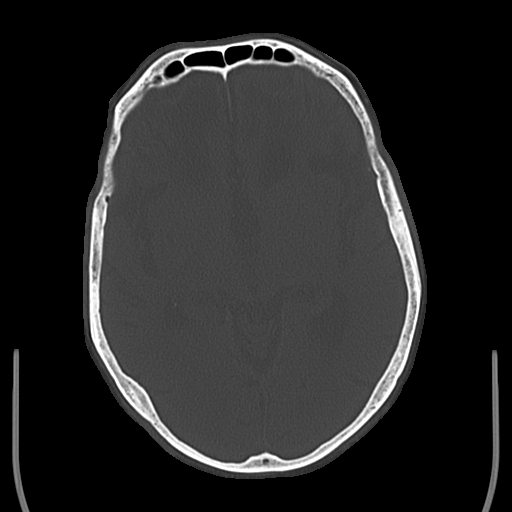
[im 32/57  bone]
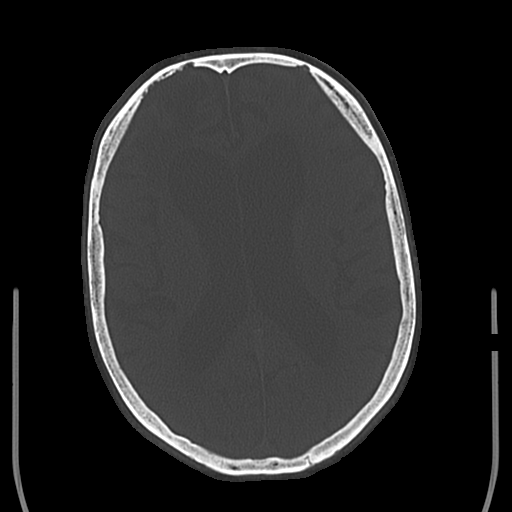
[im 38/57  bone]
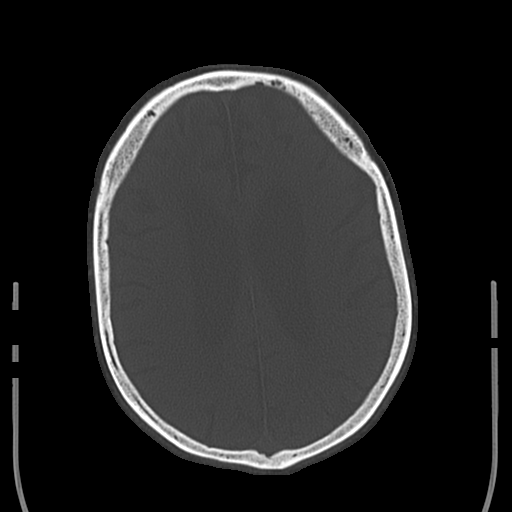
[im 44/57  bone]
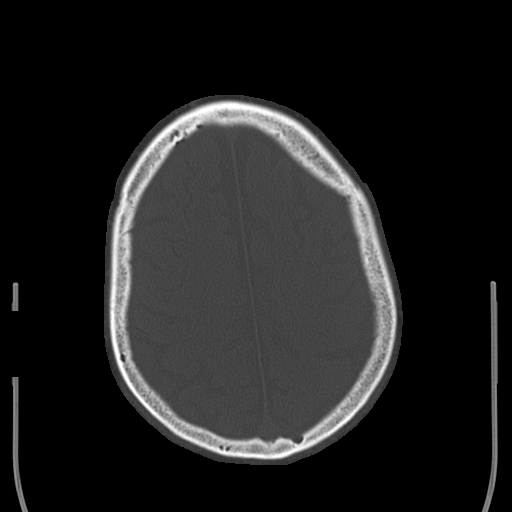
[im 50/57  bone]
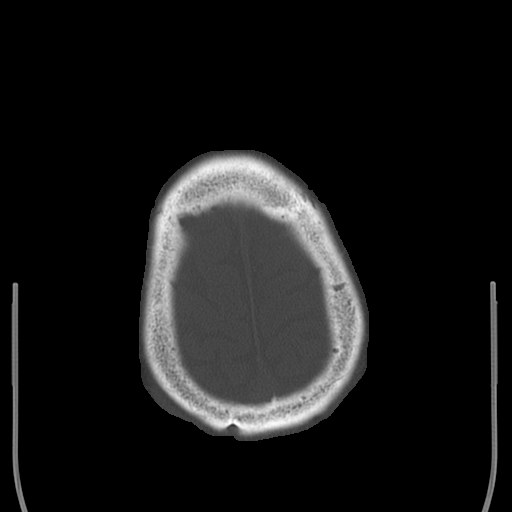

[17 of 30 positions shown; findings below may reference images not displayed]

FINDINGS: There is stable dilatation of the ventricles out of proportion with
the sulci which may represent central volume loss versus normal
pressure hydrocephalus. Clinical correlation is recommended.
Periventricular and deep white matter hypodensities represent
chronic microvascular ischemic changes. Stable areas of bifrontal
old infarct and encephalomalacia changes noted. There is no
intracranial hemorrhage. No mass effect or midline shift identified.

The visualized paranasal sinuses and mastoid air cells are well
aerated. The calvarium is intact.
IMPRESSION: No acute intracranial pathology.

Age-related atrophy and chronic microvascular ischemic disease.
Stable appearing old infarcts.

## 2016-10-20 IMAGING — CR DG CHEST 2V
2 series · 2 of 2 positions shown · non-contrast
Comparison: Chest radiograph dated 09/25/2013 all

CLINICAL DATA: 64-year-old male with fall and fever.

EXAM:
CHEST  2 VIEW

[w chest lat]
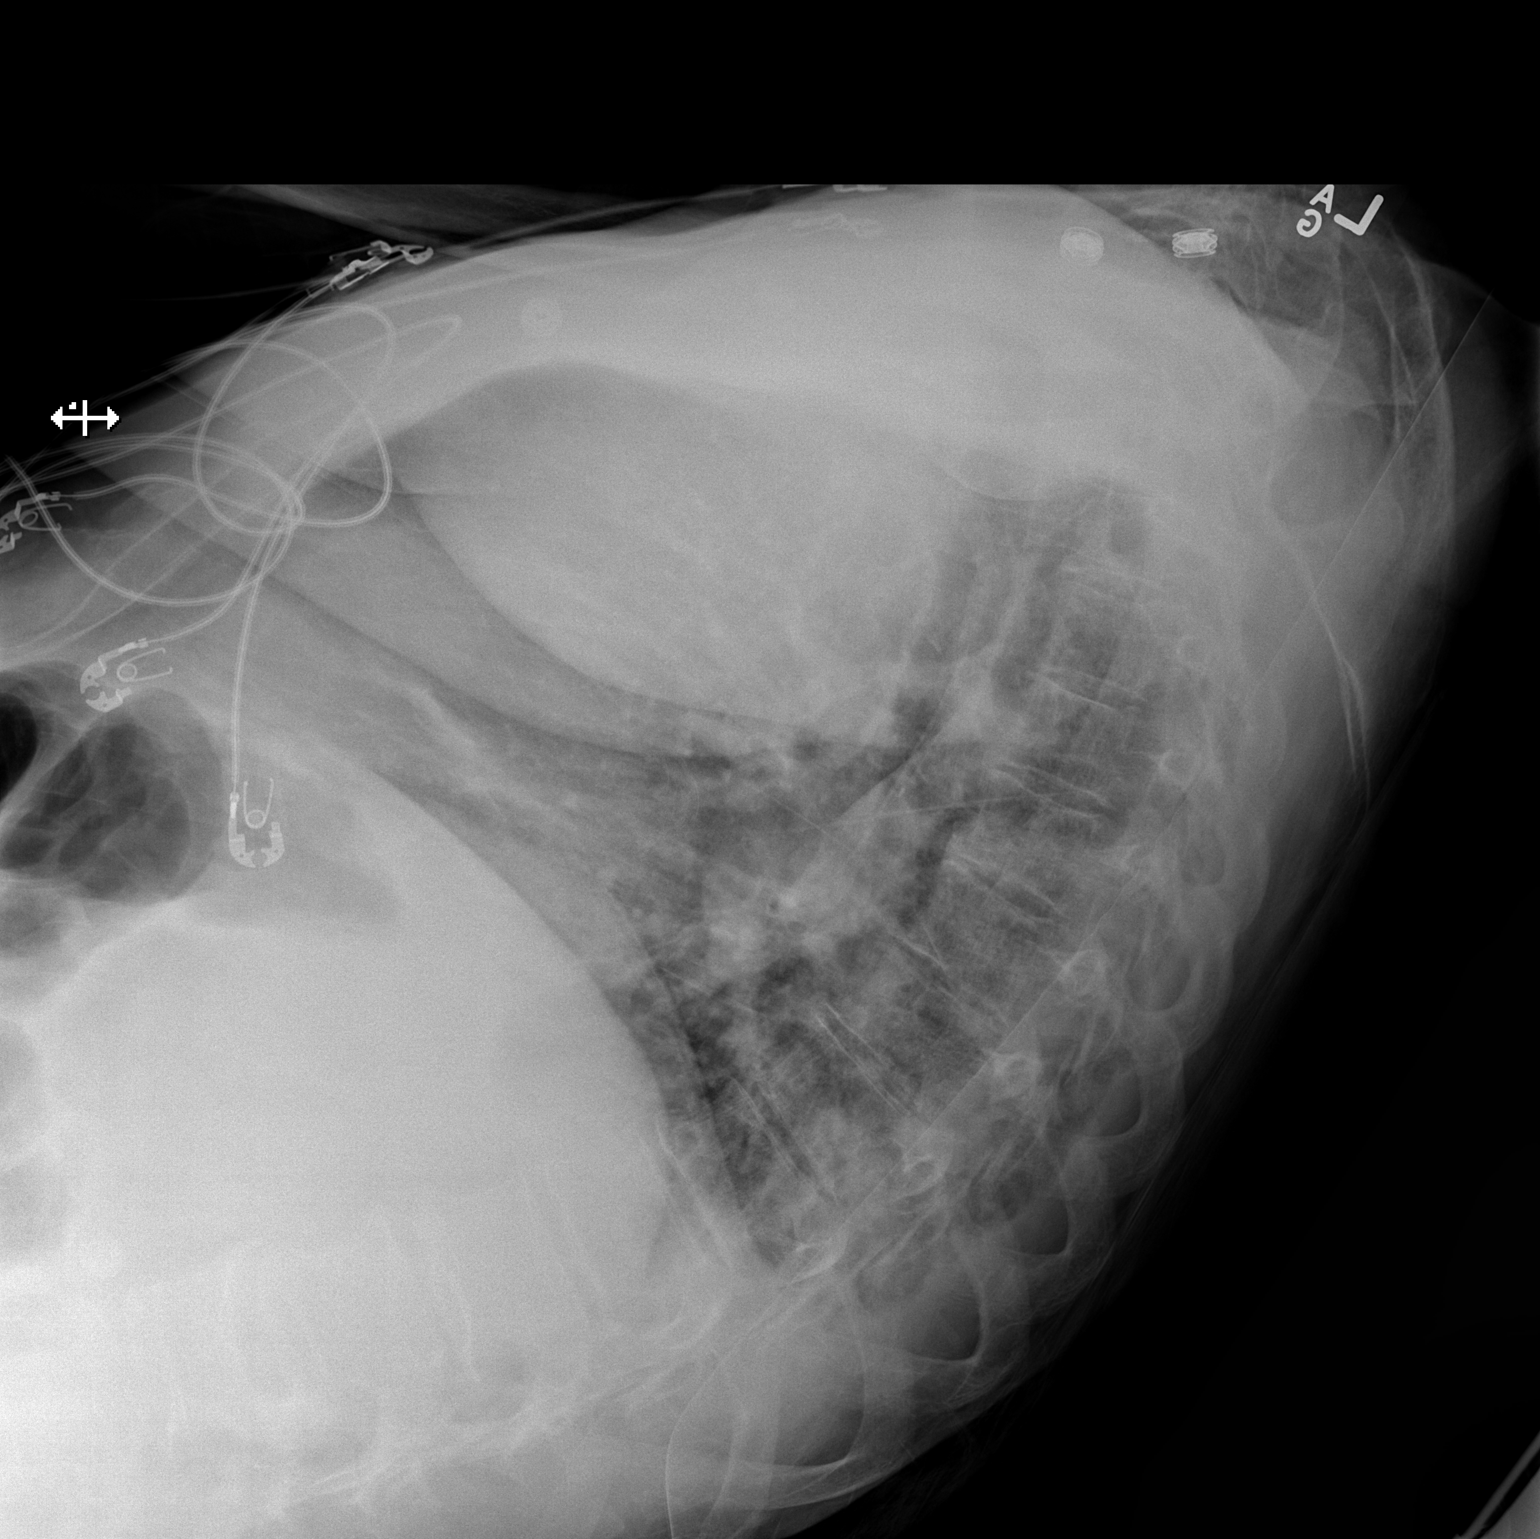

[x chest ap]
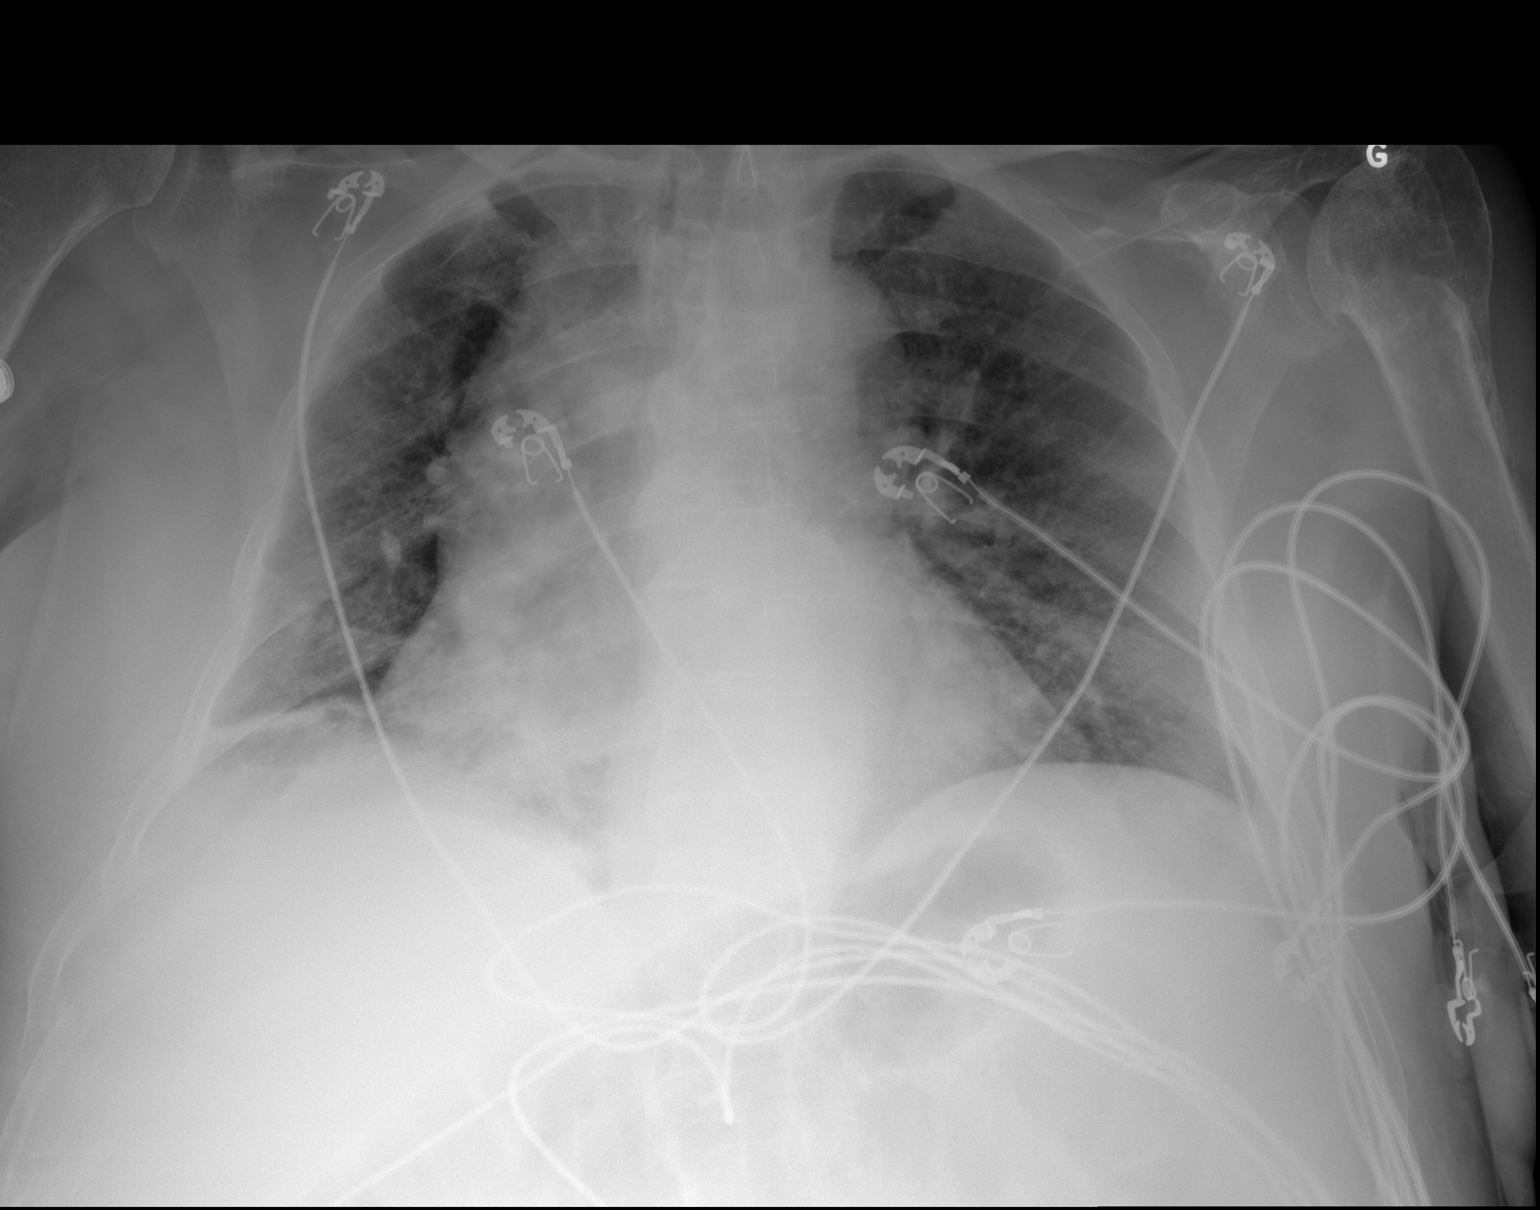

[2 of 2 positions shown; findings below may reference images not displayed]

FINDINGS: There is widening of the cardiomediastinal silhouette, significantly
increased from prior study. This may be partially related to shallow
inspiratory effort and imaging technique. Underlying Traumatic
aortic or mediastinal injury is not excluded. Clinical correlation
recommended. CT with contrast may provide better evaluation there is
clinical concern for traumatic aortic or mediastinal injury. There
are bibasilar linear atelectatic changes. There is no focal
consolidation, pleural effusion, or pneumothorax. There are
degenerative changes of the shoulders. No acute fracture.
IMPRESSION: Enlarged cardiomediastinal silhouette. Underlying traumatic
mediastinal injury is not excluded. Clinical correlation is
recommended. CT of the chest may provide better evaluation if
clinically indicated.

No focal consolidation.

These results were called by telephone at the time of interpretation
on 09/03/2015 at [DATE] to Dr. GATITO HENNINGS , who verbally
acknowledged these results.
